# Patient Record
Sex: Male | Born: 1960 | Race: Black or African American | Hispanic: No | Marital: Married | Smoking: Never smoker
Health system: Southern US, Community
[De-identification: ages and names within clinical notes are randomized; demographics above are authoritative.]

## PROBLEM LIST (undated history)

## (undated) DIAGNOSIS — I5020 Unspecified systolic (congestive) heart failure: Secondary | ICD-10-CM

## (undated) DIAGNOSIS — I10 Essential (primary) hypertension: Secondary | ICD-10-CM

## (undated) DIAGNOSIS — M109 Gout, unspecified: Secondary | ICD-10-CM

## (undated) DIAGNOSIS — B1910 Unspecified viral hepatitis B without hepatic coma: Secondary | ICD-10-CM

## (undated) DIAGNOSIS — D649 Anemia, unspecified: Secondary | ICD-10-CM

## (undated) DIAGNOSIS — D696 Thrombocytopenia, unspecified: Secondary | ICD-10-CM

## (undated) DIAGNOSIS — I4891 Unspecified atrial fibrillation: Secondary | ICD-10-CM

## (undated) DIAGNOSIS — N289 Disorder of kidney and ureter, unspecified: Secondary | ICD-10-CM

## (undated) HISTORY — PX: COLONOSCOPY: SHX174

---

## 2013-01-03 ENCOUNTER — Other Ambulatory Visit: Payer: Self-pay | Admitting: Infectious Diseases

## 2013-01-03 ENCOUNTER — Ambulatory Visit
Admission: RE | Admit: 2013-01-03 | Discharge: 2013-01-03 | Disposition: A | Discharge status: Home / Self Care | Payer: No Typology Code available for payment source | Source: Ambulatory Visit | Attending: Infectious Diseases | Admitting: Infectious Diseases

## 2013-01-03 DIAGNOSIS — R7611 Nonspecific reaction to tuberculin skin test without active tuberculosis: Secondary | ICD-10-CM

## 2013-12-12 DIAGNOSIS — I129 Hypertensive chronic kidney disease with stage 1 through stage 4 chronic kidney disease, or unspecified chronic kidney disease: Secondary | ICD-10-CM | POA: Insufficient documentation

## 2013-12-12 DIAGNOSIS — E559 Vitamin D deficiency, unspecified: Secondary | ICD-10-CM | POA: Insufficient documentation

## 2013-12-12 DIAGNOSIS — R809 Proteinuria, unspecified: Secondary | ICD-10-CM | POA: Insufficient documentation

## 2013-12-12 DIAGNOSIS — I509 Heart failure, unspecified: Secondary | ICD-10-CM | POA: Insufficient documentation

## 2013-12-12 DIAGNOSIS — I517 Cardiomegaly: Secondary | ICD-10-CM | POA: Insufficient documentation

## 2019-10-10 DIAGNOSIS — I493 Ventricular premature depolarization: Secondary | ICD-10-CM | POA: Insufficient documentation

## 2019-11-01 ENCOUNTER — Other Ambulatory Visit (HOSPITAL_COMMUNITY): Payer: Self-pay | Admitting: *Deleted

## 2019-11-02 ENCOUNTER — Encounter (HOSPITAL_COMMUNITY): Payer: BC Managed Care – PPO

## 2019-11-02 ENCOUNTER — Encounter (HOSPITAL_COMMUNITY): Payer: Self-pay

## 2019-11-16 ENCOUNTER — Encounter (HOSPITAL_COMMUNITY): Payer: BC Managed Care – PPO

## 2020-07-30 DIAGNOSIS — Z9289 Personal history of other medical treatment: Secondary | ICD-10-CM

## 2020-07-30 HISTORY — DX: Personal history of other medical treatment: Z92.89

## 2020-08-06 ENCOUNTER — Telehealth: Payer: Self-pay

## 2020-08-06 NOTE — Telephone Encounter (Signed)
NOTES ON FILE FROM [facility] [phone], SENT REFERRAL TO SCHEDULING

## 2020-08-07 ENCOUNTER — Telehealth: Payer: Self-pay

## 2020-08-07 NOTE — Telephone Encounter (Signed)
NOTES ON FILE FROM  [facility] [phone], SENT REFERRAL TO SCHEDULING

## 2020-08-23 ENCOUNTER — Other Ambulatory Visit: Payer: Self-pay

## 2020-08-23 ENCOUNTER — Encounter: Payer: Self-pay | Admitting: Cardiology

## 2020-08-23 ENCOUNTER — Ambulatory Visit: Payer: BC Managed Care – PPO | Admitting: Cardiology

## 2020-08-23 VITALS — BP 173/88 | HR 72 | Ht 72.0 in | Wt 189.2 lb

## 2020-08-23 DIAGNOSIS — I10 Essential (primary) hypertension: Secondary | ICD-10-CM

## 2020-08-23 DIAGNOSIS — R079 Chest pain, unspecified: Secondary | ICD-10-CM

## 2020-08-23 DIAGNOSIS — N186 End stage renal disease: Secondary | ICD-10-CM | POA: Diagnosis not present

## 2020-08-23 MED ORDER — HYDRALAZINE HCL 50 MG PO TABS: 50.0000 mg | ORAL_TABLET | Freq: Three times a day (TID) | ORAL | 3 refills | Status: DC

## 2020-08-23 NOTE — Progress Notes (Signed)
Cardiology Office Note:    Date:  08/25/2020   ID:  Antonio Keller, DOB Sep 24, 1960, MRN [id number]  PCP:  Jolinda Croak, MD  Cardiologist:  No primary care provider on file.  Electrophysiologist:  None   Referring MD: Darrol Jump, PA-C   Chief Complaint  Patient presents with   Chest Pain    History of Present Illness:    Antonio Keller is a 61 y.o. male with a hx of ESRD, HTN who presents with chest pain.  He has a history of ESRD but is not on dialysis.  Follows with nephrology.  Most recent creatinine 13.1 on 05/13/2020.  Reports that he had an episode of cramping pain on left side of his chest.  Lasted about 20 minutes.  He was lifting things at work when the chest pain started.  He does not exercise outside of work but is active at his job.  He denies any lightheadedness, syncope, dyspnea, palpitations, or lower extremity edema.  No smoking history.  No history of heart disease in his immediate family.  No past medical history on file.  Current Medications: Current Meds  Medication Sig   carvedilol (COREG) 25 MG tablet Take 25 mg by mouth 2 (two) times daily.   chlorthalidone (HYGROTON) 25 MG tablet Take 25 mg by mouth daily.     Allergies:   Patient has no allergy information on record.   Social History   Socioeconomic History   Marital status: Married    Spouse name: Not on file   Number of children: Not on file   Years of education: Not on file   Highest education level: Not on file  Occupational History   Not on file  Tobacco Use   Smoking status: Never Smoker   Smokeless tobacco: Never Used  Substance and Sexual Activity   Alcohol use: Not Currently   Drug use: Never   Sexual activity: Yes    Partners: Female  Other Topics Concern   Not on file  Social History Narrative   Not on file   Social Determinants of Health   Financial Resource Strain: Not on file  Food Insecurity: Not on file  Transportation Needs: Not on file   Physical Activity: Not on file  Stress: Not on file  Social Connections: Not on file     Family History: No history of heart disease in his immediate family  ROS:   Please see the history of present illness.    All other systems reviewed and are negative.  EKGs/Labs/Other Studies Reviewed:    The following studies were reviewed today:  EKG:  EKG is ordered today.  The ekg ordered today demonstrates sinus rhythm, rate 72, LVH with repolarization abnormalities, QTC 494  Recent Labs: No results found for requested labs within last 8760 hours.  Recent Lipid Panel No results found for: CHOL, TRIG, HDL, CHOLHDL, VLDL, LDLCALC, LDLDIRECT  Physical Exam:    VS:  BP (!) 173/88    Pulse 72    Ht 6' (1.829 m)    Wt 189 lb 3.2 oz (85.8 kg)    SpO2 100%    BMI 25.66 kg/m     Wt Readings from Last 3 Encounters:  08/23/20 189 lb 3.2 oz (85.8 kg)     GEN: Well nourished, well developed in no acute distress HEENT: Normal NECK: No JVD; No carotid bruits LYMPHATICS: No lymphadenopathy CARDIAC: RRR, no murmurs, rubs, gallops RESPIRATORY:  Clear to auscultation without rales, wheezing or rhonchi  ABDOMEN:  Soft, non-tender, non-distended MUSCULOSKELETAL:  No edema; No deformity  SKIN: Warm and dry NEUROLOGIC:  Alert and oriented x 3 PSYCHIATRIC:  Normal affect   ASSESSMENT:    1. Chest pain of uncertain etiology   2. Essential hypertension   3. ESRD (end stage renal disease) ([place])    PLAN:    Chest pain: Atypical in description but does have CAD risk factors (ESRD, hypertension).  Recommend checking Lexiscan Myoview.  If high risk findings, he will need to be started on dialysis prior to heart catheterization.  Will check echocardiogram to evaluate for structural heart disease  Hypertension: On hydralazine 50 mg twice daily, carvedilol 25 mg twice daily, chlorthalidone 25 mg daily.  BP elevated, will increase hydralazine to 50 mg 3 times daily  ESRD: Most recent creatinine 13.1 on  05/13/2020.  Not currently on dialysis, but will likely need to start soon.  He follows with nephrology.  RTC in 6 to 8 weeks  Shared Decision Making/Informed Consent The risks [chest pain, shortness of breath, cardiac arrhythmias, dizziness, blood pressure fluctuations, myocardial infarction, stroke/transient ischemic attack, nausea, vomiting, allergic reaction, radiation exposure, metallic taste sensation and life-threatening complications (estimated to be 1 in 10,000)], benefits (risk stratification, diagnosing coronary artery disease, treatment guidance) and alternatives of a nuclear stress test were discussed in detail with Antonio Keller and he agrees to proceed.        Medication Adjustments/Labs and Tests Ordered: Current medicines are reviewed at length with the patient today.  Concerns regarding medicines are outlined above.  Orders Placed This Encounter  Procedures   MYOCARDIAL PERFUSION IMAGING   ECHOCARDIOGRAM COMPLETE   Meds ordered this encounter  Medications   hydrALAZINE (APRESOLINE) 50 MG tablet    Sig: Take 1 tablet (50 mg total) by mouth 3 (three) times daily.    Dispense:  270 tablet    Refill:  3    Dose increase    Patient Instructions  Medication Instructions:  INCREASE hydralazine to 50 mg three times daily (every 8 hours)  *If you need a refill on your cardiac medications before your next appointment, please call your pharmacy*  Testing/Procedures: Your physician has requested that you have an echocardiogram. Echocardiography is a painless test that uses sound waves to create images of your heart. It provides your doctor with information about the size and shape of your heart and how well your hearts chambers and valves are working. This procedure takes approximately one hour. There are no restrictions for this procedure.  This will be done at our [facility] location:  [place] has requested that you have a  lexiscan myoview. For further information please visit [email or web address]. Please follow instruction sheet, as given. This will take place at [place], [address]  How to prepare for your Myocardial Perfusion Test:  Do not eat or drink 3 hours prior to your test, except you may have water.  Do not consume products containing caffeine (regular or decaffeinated) 12 hours prior to your test. (ex: coffee, chocolate, sodas, tea).  Do bring a list of your current medications with you.  If not listed below, you may take your medications as normal.  Do wear comfortable clothes (no dresses or overalls) and walking shoes, tennis shoes preferred (No heels or open toe shoes are allowed).  Do NOT wear cologne, perfume, aftershave, or lotions (deodorant is allowed).  The test will take approximately 3 to 4 hours to complete  If these  instructions are not followed, your test will have to be rescheduled.   Follow-Up: At [facility], you and your health needs are our priority.  As part of our continuing mission to provide you with exceptional heart care, we have created designated Provider Care Teams.  These Care Teams include your primary Cardiologist (physician) and Advanced Practice Providers (APPs -  Physician Assistants and Nurse Practitioners) who all work together to provide you with the care you need, when you need it.  We recommend signing up for the patient portal called "MyChart".  Sign up information is provided on this After Visit Summary.  MyChart is used to connect with patients for Virtual Visits (Telemedicine).  Patients are able to view lab/test results, encounter notes, upcoming appointments, etc.  Non-urgent messages can be sent to your provider as well.   To learn more about what you can do with MyChart, go to [email or web address].    Your next appointment:   6-8 week(s)  The format for your next appointment:   In Person  Provider:    Dr. Gardiner Rhyme         Signed, Donato Heinz, MD  08/25/2020 2:13 PM    [place]

## 2020-08-23 NOTE — Patient Instructions (Addendum)
Medication Instructions:  INCREASE hydralazine to 50 mg three times daily (every 8 hours)  *If you need a refill on your cardiac medications before your next appointment, please call your pharmacy*  Testing/Procedures: Your physician has requested that you have an echocardiogram. Echocardiography is a painless test that uses sound waves to create images of your heart. It provides your doctor with information about the size and shape of your heart and how well your heart's chambers and valves are working. This procedure takes approximately one hour. There are no restrictions for this procedure.  This will be done at our [facility] location:  [place] has requested that you have a lexiscan myoview. For further information please visit [email or web address]. Please follow instruction sheet, as given. This will take place at [place], [address]  How to prepare for your Myocardial Perfusion Test:  Do not eat or drink 3 hours prior to your test, except you may have water.  Do not consume products containing caffeine (regular or decaffeinated) 12 hours prior to your test. (ex: coffee, chocolate, sodas, tea).  Do bring a list of your current medications with you.  If not listed below, you may take your medications as normal.  Do wear comfortable clothes (no dresses or overalls) and walking shoes, tennis shoes preferred (No heels or open toe shoes are allowed).  Do NOT wear cologne, perfume, aftershave, or lotions (deodorant is allowed).  The test will take approximately 3 to 4 hours to complete  If these instructions are not followed, your test will have to be rescheduled.   Follow-Up: At [facility], you and your health needs are our priority.  As part of our continuing mission to provide you with exceptional heart care, we have created designated Provider Care Teams.  These Care Teams include your primary Cardiologist (physician) and  Advanced Practice Providers (APPs -  Physician Assistants and Nurse Practitioners) who all work together to provide you with the care you need, when you need it.  We recommend signing up for the patient portal called "MyChart".  Sign up information is provided on this After Visit Summary.  MyChart is used to connect with patients for Virtual Visits (Telemedicine).  Patients are able to view lab/test results, encounter notes, upcoming appointments, etc.  Non-urgent messages can be sent to your provider as well.   To learn more about what you can do with MyChart, go to [email or web address].    Your next appointment:   6-8 week(s)  The format for your next appointment:   In Person  Provider:    Dr. Gardiner Rhyme

## 2020-08-29 NOTE — Addendum Note (Signed)
Addended by: Lubertha Sayres on: 08/29/2020 11:25 AM   Modules accepted: Orders

## 2020-09-05 ENCOUNTER — Telehealth (HOSPITAL_COMMUNITY): Payer: Self-pay

## 2020-09-05 NOTE — Telephone Encounter (Signed)
Close encounter 

## 2020-09-10 ENCOUNTER — Ambulatory Visit (HOSPITAL_COMMUNITY)
Admission: RE | Admit: 2020-09-10 | Payer: BC Managed Care – PPO | Source: Ambulatory Visit | Attending: Cardiology | Admitting: Cardiology

## 2020-09-10 ENCOUNTER — Encounter (HOSPITAL_COMMUNITY): Payer: Self-pay | Admitting: Cardiology

## 2020-09-19 ENCOUNTER — Telehealth (HOSPITAL_COMMUNITY): Payer: Self-pay | Admitting: Cardiology

## 2020-09-19 NOTE — Telephone Encounter (Signed)
Just an FYI. We have made several attempts to contact this patient including sending a letter to schedule or reschedule their Myoview. We will be removing the patient from the echo/nuc WQ.   09/10/20 NO SHOWED -MAILED LETTER [facility]     Thank you

## 2020-09-25 ENCOUNTER — Other Ambulatory Visit (HOSPITAL_COMMUNITY): Payer: BC Managed Care – PPO

## 2020-09-25 ENCOUNTER — Encounter (HOSPITAL_COMMUNITY): Payer: Self-pay | Admitting: Cardiology

## 2020-10-09 ENCOUNTER — Telehealth (HOSPITAL_COMMUNITY): Payer: Self-pay | Admitting: Cardiology

## 2020-10-09 NOTE — Telephone Encounter (Signed)
Just an FYI. We have made several attempts to contact this patient including sending a letter to schedule or reschedule their echocardiogram. We will be removing the patient from the echo WQ.    09/25/20 NO SHOWED -MAILED LETTER [facility]  Thank you 

## 2020-10-18 ENCOUNTER — Ambulatory Visit: Payer: BC Managed Care – PPO | Admitting: Cardiology

## 2020-12-18 ENCOUNTER — Observation Stay (HOSPITAL_COMMUNITY): Payer: BC Managed Care – PPO

## 2020-12-18 ENCOUNTER — Emergency Department (HOSPITAL_COMMUNITY): Payer: BC Managed Care – PPO

## 2020-12-18 ENCOUNTER — Observation Stay (HOSPITAL_COMMUNITY)
Admission: EM | Admit: 2020-12-18 | Discharge: 2020-12-19 | Disposition: A | Discharge status: Home / Self Care | Payer: BC Managed Care – PPO | Attending: Internal Medicine | Admitting: Internal Medicine

## 2020-12-18 ENCOUNTER — Encounter (HOSPITAL_COMMUNITY): Payer: Self-pay | Admitting: Emergency Medicine

## 2020-12-18 DIAGNOSIS — I10 Essential (primary) hypertension: Secondary | ICD-10-CM | POA: Diagnosis present

## 2020-12-18 DIAGNOSIS — E872 Acidosis: Secondary | ICD-10-CM | POA: Diagnosis not present

## 2020-12-18 DIAGNOSIS — I160 Hypertensive urgency: Secondary | ICD-10-CM | POA: Diagnosis not present

## 2020-12-18 DIAGNOSIS — N186 End stage renal disease: Secondary | ICD-10-CM | POA: Diagnosis not present

## 2020-12-18 DIAGNOSIS — I120 Hypertensive chronic kidney disease with stage 5 chronic kidney disease or end stage renal disease: Principal | ICD-10-CM | POA: Insufficient documentation

## 2020-12-18 DIAGNOSIS — N189 Chronic kidney disease, unspecified: Secondary | ICD-10-CM

## 2020-12-18 DIAGNOSIS — Z20822 Contact with and (suspected) exposure to covid-19: Secondary | ICD-10-CM | POA: Diagnosis not present

## 2020-12-18 DIAGNOSIS — N179 Acute kidney failure, unspecified: Secondary | ICD-10-CM | POA: Diagnosis not present

## 2020-12-18 DIAGNOSIS — D631 Anemia in chronic kidney disease: Secondary | ICD-10-CM | POA: Diagnosis not present

## 2020-12-18 DIAGNOSIS — D649 Anemia, unspecified: Secondary | ICD-10-CM

## 2020-12-18 DIAGNOSIS — Z79899 Other long term (current) drug therapy: Secondary | ICD-10-CM | POA: Insufficient documentation

## 2020-12-18 DIAGNOSIS — N185 Chronic kidney disease, stage 5: Secondary | ICD-10-CM

## 2020-12-18 HISTORY — DX: Disorder of kidney and ureter, unspecified: N28.9

## 2020-12-18 HISTORY — DX: Essential (primary) hypertension: I10

## 2020-12-18 LAB — URINALYSIS, ROUTINE W REFLEX MICROSCOPIC
Bilirubin Urine: NEGATIVE
Glucose, UA: NEGATIVE mg/dL
Ketones, ur: NEGATIVE mg/dL
Leukocytes,Ua: NEGATIVE
Nitrite: NEGATIVE
Protein, ur: 100 mg/dL — AB
Specific Gravity, Urine: 1.008 (ref 1.005–1.030)
pH: 6 (ref 5.0–8.0)

## 2020-12-18 LAB — IRON AND TIBC
Iron: 59 ug/dL (ref 45–182)
Saturation Ratios: 18 % (ref 17.9–39.5)
TIBC: 335 ug/dL (ref 250–450)
UIBC: 276 ug/dL

## 2020-12-18 LAB — CBC
HCT: 19.7 % — ABNORMAL LOW (ref 39.0–52.0)
HCT: 21.2 % — ABNORMAL LOW (ref 39.0–52.0)
Hemoglobin: 6.7 g/dL — CL (ref 13.0–17.0)
Hemoglobin: 7.1 g/dL — ABNORMAL LOW (ref 13.0–17.0)
MCH: 27.6 pg (ref 26.0–34.0)
MCH: 27.6 pg (ref 26.0–34.0)
MCHC: 34 g/dL (ref 30.0–36.0)
MCHC: 33.5 g/dL (ref 30.0–36.0)
MCV: 81.1 fL (ref 80.0–100.0)
MCV: 82.5 fL (ref 80.0–100.0)
Platelets: 79 10*3/uL — ABNORMAL LOW (ref 150–400)
Platelets: 77 10*3/uL — ABNORMAL LOW (ref 150–400)
RBC: 2.43 MIL/uL — ABNORMAL LOW (ref 4.22–5.81)
RBC: 2.57 MIL/uL — ABNORMAL LOW (ref 4.22–5.81)
RDW: 15.4 % (ref 11.5–15.5)
RDW: 15.4 % (ref 11.5–15.5)
WBC: 4.8 10*3/uL (ref 4.0–10.5)
WBC: 4.5 10*3/uL (ref 4.0–10.5)
nRBC: 0 % (ref 0.0–0.2)
nRBC: 0 % (ref 0.0–0.2)

## 2020-12-18 LAB — RETICULOCYTES
Immature Retic Fract: 13.6 % (ref 2.3–15.9)
RBC.: 2.47 MIL/uL — ABNORMAL LOW (ref 4.22–5.81)
Retic Count, Absolute: 20.5 10*3/uL (ref 19.0–186.0)
Retic Ct Pct: 0.8 % (ref 0.4–3.1)

## 2020-12-18 LAB — COMPREHENSIVE METABOLIC PANEL
ALT: 17 U/L (ref 0–44)
AST: 13 U/L — ABNORMAL LOW (ref 15–41)
Albumin: 3.6 g/dL (ref 3.5–5.0)
Alkaline Phosphatase: 88 U/L (ref 38–126)
Anion gap: 15 (ref 5–15)
BUN: 142 mg/dL — ABNORMAL HIGH (ref 6–20)
CO2: 17 mmol/L — ABNORMAL LOW (ref 22–32)
Calcium: 7.3 mg/dL — ABNORMAL LOW (ref 8.9–10.3)
Chloride: 107 mmol/L (ref 98–111)
Creatinine, Ser: 16.49 mg/dL — ABNORMAL HIGH (ref 0.61–1.24)
GFR, Estimated: 3 mL/min — ABNORMAL LOW (ref >60)
Glucose, Bld: 91 mg/dL (ref 70–99)
Potassium: 4.2 mmol/L (ref 3.5–5.1)
Sodium: 139 mmol/L (ref 135–145)
Total Bilirubin: 0.7 mg/dL (ref 0.3–1.2)
Total Protein: 6.9 g/dL (ref 6.5–8.1)

## 2020-12-18 LAB — PROTIME-INR
INR: 1.3 — ABNORMAL HIGH (ref 0.8–1.2)
Prothrombin Time: 16.1 seconds — ABNORMAL HIGH (ref 11.4–15.2)

## 2020-12-18 LAB — TECHNOLOGIST SMEAR REVIEW: Plt Morphology: NORMAL

## 2020-12-18 LAB — MAGNESIUM: Magnesium: 2 mg/dL (ref 1.7–2.4)

## 2020-12-18 LAB — SARS CORONAVIRUS 2 (TAT 6-24 HRS): SARS Coronavirus 2: NEGATIVE

## 2020-12-18 LAB — VITAMIN B12: Vitamin B-12: 414 pg/mL (ref 180–914)

## 2020-12-18 LAB — TSH: TSH: 2.152 u[IU]/mL (ref 0.350–4.500)

## 2020-12-18 LAB — FOLATE: Folate: 12.5 ng/mL (ref >5.9)

## 2020-12-18 LAB — PHOSPHORUS: Phosphorus: 8.3 mg/dL — ABNORMAL HIGH (ref 2.5–4.6)

## 2020-12-18 LAB — VITAMIN D 25 HYDROXY (VIT D DEFICIENCY, FRACTURES): Vit D, 25-Hydroxy: 26.07 ng/mL — ABNORMAL LOW (ref 30–100)

## 2020-12-18 LAB — ABO/RH: ABO/RH(D): O POS

## 2020-12-18 LAB — HIV ANTIBODY (ROUTINE TESTING W REFLEX): HIV Screen 4th Generation wRfx: NONREACTIVE

## 2020-12-18 LAB — FERRITIN: Ferritin: 232 ng/mL (ref 24–336)

## 2020-12-18 LAB — PREPARE RBC (CROSSMATCH)

## 2020-12-18 LAB — APTT: aPTT: 41 seconds — ABNORMAL HIGH (ref 24–36)

## 2020-12-18 MED ORDER — HEPARIN SODIUM (PORCINE) 5000 UNIT/ML IJ SOLN: 5000.0000 [IU] | Freq: Three times a day (TID) | INTRAMUSCULAR | Status: DC

## 2020-12-18 MED ORDER — CARVEDILOL 25 MG PO TABS
25.0000 mg | ORAL_TABLET | Freq: Two times a day (BID) | ORAL | Status: DC
  Administered 2020-12-19: 25 mg via ORAL
  Filled 2020-12-18: qty 1

## 2020-12-18 MED ORDER — HYDRALAZINE HCL 25 MG PO TABS
25.0000 mg | ORAL_TABLET | Freq: Once | ORAL | Status: AC
  Administered 2020-12-18: 25 mg via ORAL
  Filled 2020-12-18: qty 1

## 2020-12-18 MED ORDER — LABETALOL HCL 5 MG/ML IV SOLN
20.0000 mg | Freq: Once | INTRAVENOUS | Status: AC
  Administered 2020-12-18: 20 mg via INTRAVENOUS
  Filled 2020-12-18: qty 4

## 2020-12-18 MED ORDER — HYDRALAZINE HCL 25 MG PO TABS: 25.0000 mg | ORAL_TABLET | Freq: Three times a day (TID) | ORAL | Status: DC

## 2020-12-18 MED ORDER — LABETALOL HCL 5 MG/ML IV SOLN: 5.0000 mg | INTRAVENOUS | Status: DC | PRN

## 2020-12-18 MED ORDER — SENNOSIDES-DOCUSATE SODIUM 8.6-50 MG PO TABS: 1.0000 | ORAL_TABLET | Freq: Every evening | ORAL | Status: DC | PRN

## 2020-12-18 MED ORDER — ACETAMINOPHEN 325 MG PO TABS: 650.0000 mg | ORAL_TABLET | Freq: Four times a day (QID) | ORAL | Status: DC | PRN

## 2020-12-18 MED ORDER — METOPROLOL TARTRATE 5 MG/5ML IV SOLN
5.0000 mg | Freq: Once | INTRAVENOUS | Status: AC
  Administered 2020-12-18: 5 mg via INTRAVENOUS
  Filled 2020-12-18: qty 5

## 2020-12-18 MED ORDER — SODIUM CHLORIDE 0.9% FLUSH
3.0000 mL | Freq: Two times a day (BID) | INTRAVENOUS | Status: DC
  Administered 2020-12-18 – 2020-12-19 (×3): 3 mL via INTRAVENOUS

## 2020-12-18 MED ORDER — SODIUM BICARBONATE 650 MG PO TABS
650.0000 mg | ORAL_TABLET | Freq: Three times a day (TID) | ORAL | Status: DC
  Administered 2020-12-18 – 2020-12-19 (×3): 650 mg via ORAL
  Filled 2020-12-18 (×4): qty 1

## 2020-12-18 MED ORDER — SODIUM CHLORIDE 0.9% IV SOLUTION: Freq: Once | INTRAVENOUS | Status: DC

## 2020-12-18 MED ORDER — HYDRALAZINE HCL 10 MG PO TABS
10.0000 mg | ORAL_TABLET | Freq: Three times a day (TID) | ORAL | Status: DC
  Administered 2020-12-19: 10 mg via ORAL
  Filled 2020-12-18: qty 1

## 2020-12-18 MED ORDER — ACETAMINOPHEN 650 MG RE SUPP: 650.0000 mg | Freq: Four times a day (QID) | RECTAL | Status: DC | PRN

## 2020-12-18 NOTE — Consult Note (Addendum)
[facility] Renal Consultation Note  Requesting MD: Dr. Heber [place] Indication for Consultation: Progressive renal failure  HPI:  Antonio Keller is a 60 y.o. 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 Patient follows with [facility], previously saw Dr. Chancy Milroy but now sees Dr. Moshe Cipro.  Patient was seen at nephrologist office on Monday and lab was significant for low hemoglobin.  Patient was called on Tuesday to come to the ED for blood transfusion.  Patient decided to come to the ED today.  States he has been working all week and feels fine.  He denies any nausea, vomiting, dizziness, abdominal pain, shortness of breath, chest pain, palpitations, headaches, blurred vision, bloody stools, hematuria, fever/chills, dysuria, itching or muscle cramps.   Of note, patient's nephrologist has had extensive conversations with patient about the need for dialysis in the setting of his progressive renal disease. Patient has refused multiple times as he reports not feeling bad and has a loved one who had a traumatic experience with dialysis in the past.  In the ED, found to have BP 190-210/100-120 and HR in the 70s.  Labs were significant for creatinine 16.49, BUN 142, GFR 3, [id number] 7.3, metabolic acidosis with bicarb of 17, hemoglobin 6.7, platelets 79, ferritin 232, iron 59, iron sat 18%, folate 12.5, vitamin B12 414.  While in the ED, patient developed A. fib with HR in the 120s to 140s.  IMTS was consulted for admission and nephrology was consulted for further management of his renal failure.  Creatinine, Ser  Date/Time Value Ref Range Status  12/18/2020 08:52 AM 16.49 (H) 0.61 - 1.24 mg/dL Final    PMHx:   Past Medical History:  Diagnosis Date   Hypertension    Renal disorder     History reviewed. No pertinent surgical  history.  Family Hx: History reviewed. No pertinent family history.  Social History:  reports that he has never smoked. He has never used smokeless tobacco. He reports previous alcohol use. He reports that he does not use drugs.  Allergies: No Known Allergies  Medications: Prior to Admission medications   Medication Sig Start Date End Date Taking? Authorizing Provider  allopurinol (ZYLOPRIM) 100 MG tablet Take 100 mg by mouth daily. 12/01/20   [provider]  carvedilol (COREG) 25 MG tablet Take 25 mg by mouth 2 (two) times daily. 04/26/18   [provider]  chlorthalidone (HYGROTON) 25 MG tablet Take 25 mg by mouth daily. 07/16/20   [provider]  doxercalciferol (HECTOROL) 0.5 MCG capsule Take 0.5 mcg by mouth daily. 10/08/20   [provider]  hydrALAZINE (APRESOLINE) 50 MG tablet Take 1 tablet (50 mg total) by mouth 3 (three) times daily. 08/23/20   Donato Heinz, MD    I have reviewed the patient's current medications.  Labs:  Results for orders placed or performed during the hospital encounter of 12/18/20 (from the past 48 hour(s))  Comprehensive metabolic panel     Status: Abnormal   Collection Time: 12/18/20  8:52 AM  Result Value Ref Range   Sodium 139 135 - 145 mmol/L   Potassium 4.2 3.5 - 5.1 mmol/L   Chloride 107 98 - 111 mmol/L   CO2 17 (L) 22 - 32 mmol/L   Glucose, Bld 91 70 - 99 mg/dL    Comment: Glucose reference range applies only to samples taken  after fasting for at least 8 hours.   BUN 142 (H) 6 - 20 mg/dL   Creatinine, Ser 16.49 (H) 0.61 - 1.24 mg/dL   Calcium 7.3 (L) 8.9 - 10.3 mg/dL   Total Protein 6.9 6.5 - 8.1 g/dL   Albumin 3.6 3.5 - 5.0 g/dL   AST 13 (L) 15 - 41 U/L   ALT 17 0 - 44 U/L   Alkaline Phosphatase 88 38 - 126 U/L   Total Bilirubin 0.7 0.3 - 1.2 mg/dL   GFR, Estimated 3 (L) >60 mL/min    Comment: (NOTE) Calculated using the CKD-EPI Creatinine Equation (2021)    Anion gap 15 5 - 15    Comment:  Performed at [facility], [place] [address] [place] [address]  CBC     Status: Abnormal   Collection Time: 12/18/20  8:52 AM  Result Value Ref Range   WBC 4.8 4.0 - 10.5 K/uL   RBC 2.43 (L) 4.22 - 5.81 MIL/uL   Hemoglobin 6.7 (LL) 13.0 - 17.0 g/dL    Comment: REPEATED TO VERIFY THIS CRITICAL RESULT HAS VERIFIED AND BEEN CALLED TO C ROWE, RN BY JENNIFER COLE ON 07 20 2022 AT 1007, AND HAS BEEN READ BACK.     HCT 19.7 (L) 39.0 - 52.0 %   MCV 81.1 80.0 - 100.0 fL   MCH 27.6 26.0 - 34.0 pg   MCHC 34.0 30.0 - 36.0 g/dL   RDW 15.4 11.5 - 15.5 %   Platelets 79 (L) 150 - 400 K/uL    Comment: Immature Platelet Fraction may be clinically indicated, consider ordering this additional test [id number] REPEATED TO VERIFY PLATELET COUNT CONFIRMED BY SMEAR    nRBC 0.0 0.0 - 0.2 %    Comment: Performed at [facility], [place] [address], [place], [place] [address]  Reticulocytes     Status: Abnormal   Collection Time: 12/18/20  8:52 AM  Result Value Ref Range   Retic Ct Pct 0.8 0.4 - 3.1 %   RBC. 2.47 (L) 4.22 - 5.81 MIL/uL   Retic Count, Absolute 20.5 19.0 - 186.0 K/uL   Immature Retic Fract 13.6 2.3 - 15.9 %    Comment: Performed at [facility] [address] [place], [place] [address]  Type and screen [place]     Status: None (Preliminary result)   Collection Time: 12/18/20  9:00 AM  Result Value Ref Range   ABO/RH(D) O POS    Antibody Screen NEG    Sample Expiration      12/21/2020,2359 Performed at [facility], [place] [address], [place], [place] [address]    Unit Number [id number]    Blood Component Type RBC LR PHER1    Unit division 00    Status of Unit ALLOCATED    Transfusion Status OK TO TRANSFUSE    Crossmatch Result Compatible   ABO/Rh     Status: None   Collection Time: 12/18/20  9:05 AM  Result Value Ref Range   ABO/RH(D)      O POS Performed at [facility], [place] [address] [place], [place] [address]    Vitamin B12     Status: None   Collection Time: 12/18/20 11:28 AM  Result Value Ref Range   Vitamin B-12 414 180 - 914 pg/mL    Comment: (NOTE) This assay is not validated for testing neonatal or myeloproliferative syndrome specimens for Vitamin B12 levels. Performed at  [facility], [place] [address] [place], [place] [address]   Folate     Status: None   Collection Time: 12/18/20 11:28 AM  Result Value Ref Range   Folate 12.5 >5.9 ng/mL    Comment: Performed at [facility] [address], [place], [place] [address]  Iron and TIBC     Status: None   Collection Time: 12/18/20 11:28 AM  Result Value Ref Range   Iron 59 45 - 182 ug/dL   TIBC 335 250 - 450 ug/dL   Saturation Ratios 18 17.9 - 39.5 %   UIBC 276 ug/dL    Comment: Performed at [facility], [place] [address], [place], [place] [address]  Ferritin     Status: None   Collection Time: 12/18/20 11:28 AM  Result Value Ref Range   Ferritin 232 24 - 336 ng/mL    Comment: Performed at [facility], [place] [address], [place], [place] [address]  Prepare RBC (crossmatch)     Status: None   Collection Time: 12/18/20  4:00 PM  Result Value Ref Range   Order Confirmation      ORDER PROCESSED BY BLOOD BANK Performed at [facility], [place] [address], [place], [place] [address]      ROS:  Pertinent items are noted in HPI.  Physical Exam: Vitals:   12/18/20 1645 12/18/20 1716  BP: (!) 145/120 (!) 163/131  Pulse: (!) 124 (!) 134  Resp: 18 12  Temp:  98.2 F (36.8 C)  SpO2: 100%      General: Pleasant, well-appearing middle-age man laying in bed. No acute distress. Head: Normocephalic. Atraumatic. CV: Tachycardic. Irregular regular rhythm.  No murmurs, rubs, or gallops. No LE edema Pulmonary: Lungs CTAB. Normal effort. No wheezing or rales. Abdominal: Soft, nontender, nondistended. Normal bowel sounds. Extremities: Palpable radial and DP pulses. Normal ROM. Skin: Warm and dry. No obvious rash or  lesions.  No purpura Neuro: A&Ox3. Moves all extremities. Normal sensation. No focal deficit. Psych: Normal mood and affect   Assessment/Plan: Sherron Ipock is a 60 y.o. 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  ESRD not on dialysis -patient with history of progressive CKD since 2015.  Creatinine has ranged from 2.5 in 2015 to 4.3 in 2020 then double digits since 2020.  Patient follows with [facility] and multiple providers have had discussions with patient about starting dialysis however patient has refused every time due to not feeling bad and a loved one's traumatic experience with dialysis.  Found to have a creatinine of 16 with a BUN of 142 patient continues to denies any symptoms.  -- We will initiate plans for dialysis if patient agreeable  -- Follow-up UA to assess for proteinuria  -- MA/CR to quantify proteinuria  -- Strict I&Os, daily weights  -- Trend BMP, electrolytes  -- Avoid nephrotoxic agents 2. Hypertension/volume  -Patient with uncontrolled hypertension on multiple antihypertensives including hydralazine 50 mg 3 times daily, Coreg 25 mg twice daily, chlorthalidone 25 mg daily.  Reports he did not take any his medications today. Found to have SBP in the 190s to 210s. S/p IV labetalol 20 mg, IV Lopressor 5 mg, oral hydralazine 25 mg x 1 dose. SBP improved to the 140s to 160s.  -- Resume home hydralazine 50 mg TID,  Coreg 25 mg BID and chlorthalidone 25 mg daily  -- Lower BP gradually 4.  Acute on chronic anemia (IDA, AoCD) -Patient with a history of iron deficiency anemia and anemia of chronic disease secondary to CKD stage V.  Baseline hemoglobin 7-9 since 2020.  Found to have hemoglobin 6.7 on arrival. Iron studies within normal limits.  Currently asymptomatic with no signs of active bleed.  -- Getting 1  unit PRBC per primary team  -- Will consider giving Aranesp while inpatient  -- Trend CBC 5.  Metabolic acidosis -found to have with CO2 of 17 in setting of progressive renal failure.  -- Continue bicarb 650 3 times daily  -- Daily BMP 6. Bones: Patient with a history of tertiary hyperparathyroidism. Intact PTH of 302 and vitamin D 37 in 2020.  -- Follow-up repeat intact PTH and vitamin D 7.  New onset A. Fib --Patient found to be in A. fib with HR in the 120s to 140s in the setting of acute anemia and severe asymptomatic hypertension. EKG showed A. fib with LVH and prolonged QT. S/p IV metoprolol 5 mg.   -- Resume home Coreg 25 mg twice daily  -- Telemetry 8.  Gout --No current flare.  Reports he has been avoiding certain type of food and often skipping meals due to fear of gout flare.  -- Management per primary team 9. Thrombocytopenia -- Found to have troponin of 79 on admission from 112 on 04/2020. No purpura on exam.  -- Follow-up Smear   Lacinda Axon 12/18/2020, 4:31 PM   Patient seen and examined, agree with above note with above modifications.  Patient well known to me from OP clinic-  I have been following him for several months now-  it has been difficult to convince him that he needs RRT because he insists that he feels well-  works full time and denies any and all uremic sxms.  Lately has had anemia of CKD-  we have tried for at least 2 months to secure him iv iron and ESA as Op but his insurance company has refused so now must give a blood transfusion.  He is very nice but continues to refuse initiation of dialysis but will consent to getting blood.  We will visit again in the AM-  will probably dose with aranesp but I doubt we will be able to convince him to initiate dialysis  Corliss Parish, MD 12/18/2020

## 2020-12-18 NOTE — Hospital Course (Addendum)
Antonio Keller is a 60 year old male with a pertinent past medical history of HTN, ESRD w/ CKD stage 5, and gout who presented with a hgb of 6.7 and severe asymptomatic HTN.    Severe Asymptomatic Hypertension Presented with blood pressures elevated (SBP 210s and DBPs 100s), remained asymptomatic. Patient was restarted on home Carvedilol, Hydral, and Chlorthalidone which lowered BP to systolic [id number]. Patient continued on home BP medications on discharge.   ESRD not on dialysis Tertiary hyperparathyroidism Non-anion gap metabolic acidosis Patient presented with a Cr 16.49, and Bun 142, Co2 17 , hyperphosphatemia,and Ca 7.3. Mentation was at baseline, and patient denied abdominal pain, and chest pain. Phosphorus was also elevated. Nephrology was consulted and they recommended patient strongly consider dialysis for his case moving forward but that dialysis is not indicated urgently at this moment. Kidney function remained stable during course of admission. Renal ultrasound showed kidney changes associated with CKD. UA was unremarkable except for proteinuria. Will follow-up with established nephrologist. Recommend for renal function labs at time of follow up.   Acute on chronic anemia in setting of iron deficiency anemia and anemia of chronic disease 2/2 chronic renal disease  Patient presented with with Hgb of 6.7 on admission. Likely due to CKD given no concerns for acute bleed. Given 2u pRBC with Epo as well. Peripheral smear showed target cells. Recommend f/u CBC at follow up visit.   Tachycardia Patient presented with HR elevated 130s-150s. Remained asymptomatic. EKG unclear for SVT vs Afib w/ RVR. Rates improved s/p blood transfusion x1. Electrolytes were repleted as indicated.   Low Vitamin D Patient's vitamin D levels decreased, in the setting of ESRD. PTH labs were pending by the time of discharge. Patient was started on Vitamin D supplementation as per nephrology recommendations.

## 2020-12-18 NOTE — ED Notes (Signed)
Patient tachy 150-160's MD notified via amion

## 2020-12-18 NOTE — H&P (Addendum)
Date: 12/18/2020               Patient Name:  Antonio Keller MRN: [id number]  DOB: 01-20-1961 Age / Sex: 60 y.o., male   PCP: Jolinda Croak, MD              Medical Service: Internal Medicine Teaching Service              Attending Physician: Dr. Joni Reining    First Contact: Asha, [place] [address] Pager: [phone]  Second Contact: Dr. Marianna Payment Pager: [phone]            After Hours (After 5p/  First Contact Pager: [phone]  weekends / holidays): Second Contact Pager: [phone]   Chief Complaint: Severe asymptomatic hypertension  History of Present Illness:   Mr. Marinacci is a  60 year old male with a pertinent medical history of CKD Stage 5 not on dialysis, essential hypertension, gout, and tertiary hyperparathyroidism.   Patient presented to the ED today, because his primary care informed him that his hemoglobin was low after blood work that was done during an office visit on 12/16/20. He states that he feels fine currently. He is aware that his blood pressure tends to run high, and endorses compliance with his BP medication, however he does not check his Bps at home. Unclear if he has taken his BP medications today.  For his kidney disease, he is managed by [facility], however the patient is not amenable to dialysis unless if it is needed for lifesaving purposes given that a loved one had a traumatic experience with dialysis in the past.  Per chart review, he was seen by a Cardiologist in March 2022 for chest pain where he was suggested to undergo additional testing, echo and a Lexiscan. However, patient attributed the chest pain to chronic constipation secondary to iron supplementation he was started on hence did not undergo the testing.   He denies current GI symptoms of abdominal pain and bloody stools. He does endorse eating a high carb diet as he avoid eating protein due to his concern for gout flares. He endorses intermittent leg swelling but states that its due to his  gout and that he uses allopurinol for it.   ROS is negative for chest pain, palpitations, chest pressure, headaches, dizziness, bloody stools, abdominal pain, fatigue and shortness of breath ROS is positive for leg swelling  Meds:  Patient endorses taking Carvedilol, Hydral and Chlorthalidone No outpatient medications have been marked as taking for the 12/18/20 encounter [facility] Encounter).   Allergies: Allergies as of 12/18/2020   (No Known Allergies)   Past Medical History:  Diagnosis Date   Hypertension    Renal disorder     Family History:  Patient shares that his father had high blood pressures  Denies family history of kidney disease  Social History:  Patient works in a [employer]. He lives with his wife. He denies alcohol and tobacco product use.   Review of Systems: A complete ROS was negative except as per HPI.   Physical Exam: Blood pressure (!) 139/120, pulse (!) 131, temperature 98.1 F (36.7 C), temperature source Oral, resp. rate 12, SpO2 100 %.  Constitutional: stable-appearing sitting in bed, in no acute distress HENT: normocephalic atraumatic, Eyes: conjunctiva non-erythematous, no scleral icterus Cardiovascular: tachycardic, difficulty to assess if rhythm is regular, trace pitting edema in BLEs, JVD to the level of the mandible Pulmonary/Chest: normal work of breathing on room air, CTAB Abdominal: soft, non-tender, non-distended  MSK: no focal or gross deficits Neurological: alert, answers questions appropriately, no asterixis bilaterally Psych: affect mood congruent   EKG: personally reviewed my interpretation is tachyarrhythmia, unclear if it is regular. SVT vs A-fib with RVR. Some ST depressions in V5 and V6, however those also present in prior from 07/2020.  CXR: personally reviewed my interpretation is no cardiomegaly, no tracheal deviation, lungs not hyperinflated, no consolidations or opacities  Assessment & Plan by Problem: Active Problems:    Severe uncontrolled hypertension  Severe Asymptomatic Hypertension Presented with blood pressures elevated (SBP 210s and DBPs 100s). No headaches, dizziness, or chest pain at this time. Improved to sbp of 140s s/p labetalol 20 mg x1 and Hydral 25 mg x1, however diastolic pressure still elevated. Will continue to monitor and  - Will start Hydralazine 10 mg TID and carvedilol 25 mg BID tomorrow - Labetalol PRN for SBP > 180 and/or DBP > 100, hold if HR < 70 - continue to monitor - routine vitals  SVT versus AFIB: Presented with elevated HR 130s-150s. EKG unclear for SVT vs A-fib with RVR as mentioned above. Patient denies chest pains or palpitations at this time. HR did not improve with Metoprolol 5 mg push. Electrolytes wnl. Likely etiology is anemia. Will transfuse and reassess afterwards. If he remains tachy, then start beta blocker (carvedilol or metoprolol)  - On telemetry - Transfuse and reassess   ESRD; CKD Stage 5 Patient has a history of ESRD secondary CKD stage 5. Admission labs remarkable for creatinine 16.49, BUN 142, and Co2 of 17. Patient is alert and oriented at this time with no chest discomfort, asterixis or abdominal pain. Of note, he does have trace pitting edema in BLEs, and JVD to the level of the mandible. Patient is followed by [facility] and has not been amenable to starting dialysis previously. Inpatient nephrology team has been consulted for assessment of need for urgent dialysis. At this time urgent dialysis is not indicated and patient is refusing dialysis.  - Nephrology following; appreciate recommendations - morning CMP - UA pending - renal US pending - Bicarb 650 TID   Hypoproliferative Anemia Presents with Hgb 6.7, Hct 19.7, platelets 79. Reticulocyte index calculated to be .15. Iron is 59, TIBC 335, Saturation ratios 18. Ferritin 232. No concern for active bleed at this time. Likely secondary to ESRD. Given that he is tachycardic to the 130s-150s and  minimal hypervolemia on physical exam will transfuse. -Transfusion - Midnight CBC - follow-up POBT  Chronic Conditions: Gout: Continue home allopurinol   Dispo: Admit patient to Observation with expected length of stay less than 2 midnights.  SignedOk Edwards, Medical Student 12/18/2020, 3:45 PM  Pager: [phone]   Attestation for Student Documentation:  I personally was present and performed or re-performed the history, physical exam and medical decision-making activities of this service and have verified that the service and findings are accurately documented in the student's note.   Lawerance Cruel, D.O.  Internal Medicine Resident, PGY-3 [facility]  Pager: [phone] 6:43 PM, 12/18/2020   **Please contact the on call pager after 5 pm and on weekends at [phone].**

## 2020-12-18 NOTE — ED Provider Notes (Signed)
[facility] EMERGENCY DEPARTMENT Provider Note   CSN: [id number] Arrival date & time: 12/18/20  [id number]     History Chief Complaint  Patient presents with   Abnormal Lab   Hypertension    severe   Chronic Renal Failure    Antonio Keller is a 60 y.o. male.  Patient with hx htn, c/o being told to go to ED as blood count low. Symptoms gradual onset, moderate, persistent, slowly worsening. Patient w hx uncontrolled htn and resultant kidney disease. No dialysis as of yet. Denies recent blood loss, rectal bleeding or melena. Mild general weakness. No syncope. Denies chest pain or sob. No abd pain or nv. No extremity swelling or pain. Denies headache. No change in vision or speech. No unilateral numbness or weakness.   The history is provided by the patient.  Abnormal Lab     Past Medical History:  Diagnosis Date   Hypertension    Renal disorder     There are no problems to display for this patient.   History reviewed. No pertinent surgical history.     History reviewed. No pertinent family history.  Social History   Tobacco Use   Smoking status: Never   Smokeless tobacco: Never  Substance Use Topics   Alcohol use: Not Currently   Drug use: Never    Home Medications Prior to Admission medications   Medication Sig Start Date End Date Taking? Authorizing Provider  carvedilol (COREG) 25 MG tablet Take 25 mg by mouth 2 (two) times daily. 04/26/18   [provider]  chlorthalidone (HYGROTON) 25 MG tablet Take 25 mg by mouth daily. 07/16/20   [provider]  hydrALAZINE (APRESOLINE) 50 MG tablet Take 1 tablet (50 mg total) by mouth 3 (three) times daily. 08/23/20   Donato Heinz, MD    Allergies    Patient has no known allergies.  Review of Systems   Review of Systems  Constitutional:  Negative for fever.  HENT:  Negative for sore throat.   Eyes:  Negative for pain and visual disturbance.  Respiratory:  Negative for  shortness of breath.   Cardiovascular:  Negative for chest pain.  Gastrointestinal:  Negative for abdominal pain, blood in stool and vomiting.  Genitourinary:  Negative for flank pain and hematuria.  Musculoskeletal:  Negative for back pain and neck pain.  Skin:  Negative for rash.  Neurological:  Negative for headaches.  Hematological:  Does not bruise/bleed easily.  Psychiatric/Behavioral:  Negative for confusion.    Physical Exam Updated Vital Signs BP (!) 153/121   Pulse (!) 136   Temp 98.1 F (36.7 C) (Oral)   Resp 19   SpO2 100%   Physical Exam Vitals and nursing note reviewed.  Constitutional:      Appearance: Normal appearance. Antonio Keller is well-developed.  HENT:     Head: Atraumatic.     Nose: Nose normal.     Mouth/Throat:     Mouth: Mucous membranes are moist.     Pharynx: Oropharynx is clear.  Eyes:     General: No scleral icterus.    Pupils: Pupils are equal, round, and reactive to light.  Neck:     Trachea: No tracheal deviation.  Cardiovascular:     Rate and Rhythm: Normal rate and regular rhythm.     Pulses: Normal pulses.     Heart sounds: Normal heart sounds. No murmur heard.   No friction rub. No gallop.  Pulmonary:     Effort: Pulmonary effort  is normal. No accessory muscle usage or respiratory distress.     Breath sounds: Normal breath sounds.  Abdominal:     General: Bowel sounds are normal. There is no distension.     Palpations: Abdomen is soft.     Tenderness: There is no abdominal tenderness.  Genitourinary:    Comments: No cva tenderness. Musculoskeletal:        General: No swelling.     Cervical back: Normal range of motion and neck supple. No rigidity.  Skin:    General: Skin is warm and dry.     Findings: No rash.  Neurological:     Mental Status: Antonio Keller is alert.     Comments: Alert, speech clear.   Psychiatric:        Mood and Affect: Mood normal.    ED Results / Procedures / Treatments   Labs (all labs ordered are listed, but only  abnormal results are displayed) Results for orders placed or performed during the hospital encounter of 12/18/20  Comprehensive metabolic panel  Result Value Ref Range   Sodium 139 135 - 145 mmol/L   Potassium 4.2 3.5 - 5.1 mmol/L   Chloride 107 98 - 111 mmol/L   CO2 17 (L) 22 - 32 mmol/L   Glucose, Bld 91 70 - 99 mg/dL   BUN 142 (H) 6 - 20 mg/dL   Creatinine, Ser 16.49 (H) 0.61 - 1.24 mg/dL   Calcium 7.3 (L) 8.9 - 10.3 mg/dL   Total Protein 6.9 6.5 - 8.1 g/dL   Albumin 3.6 3.5 - 5.0 g/dL   AST 13 (L) 15 - 41 U/L   ALT 17 0 - 44 U/L   Alkaline Phosphatase 88 38 - 126 U/L   Total Bilirubin 0.7 0.3 - 1.2 mg/dL   GFR, Estimated 3 (L) >60 mL/min   Anion gap 15 5 - 15  CBC  Result Value Ref Range   WBC 4.8 4.0 - 10.5 K/uL   RBC 2.43 (L) 4.22 - 5.81 MIL/uL   Hemoglobin 6.7 (LL) 13.0 - 17.0 g/dL   HCT 19.7 (L) 39.0 - 52.0 %   MCV 81.1 80.0 - 100.0 fL   MCH 27.6 26.0 - 34.0 pg   MCHC 34.0 30.0 - 36.0 g/dL   RDW 15.4 11.5 - 15.5 %   Platelets 79 (L) 150 - 400 K/uL   nRBC 0.0 0.0 - 0.2 %  Vitamin B12  Result Value Ref Range   Vitamin B-12 414 180 - 914 pg/mL  Iron and TIBC  Result Value Ref Range   Iron 59 45 - 182 ug/dL   TIBC 335 250 - 450 ug/dL   Saturation Ratios 18 17.9 - 39.5 %   UIBC 276 ug/dL  Ferritin  Result Value Ref Range   Ferritin 232 24 - 336 ng/mL  Reticulocytes  Result Value Ref Range   Retic Ct Pct 0.8 0.4 - 3.1 %   RBC. 2.47 (L) 4.22 - 5.81 MIL/uL   Retic Count, Absolute 20.5 19.0 - 186.0 K/uL   Immature Retic Fract 13.6 2.3 - 15.9 %  Type and screen [place]  Result Value Ref Range   ABO/RH(D) O POS    Antibody Screen NEG    Sample Expiration      12/21/2020,2359 Performed at [facility], [address], [place], [place] [address]   ABO/Rh  Result Value Ref Range   ABO/RH(D)      O POS Performed at [facility]  Lab, [address], [place], [place] [address]      EKG None  Radiology No results  found.  Procedures Procedures   Medications Ordered in ED Medications  hydrALAZINE (APRESOLINE) tablet 25 mg (has no administration in time range)  labetalol (NORMODYNE) injection 20 mg (20 mg Intravenous Given 12/18/20 1208)    ED Course  I have reviewed the triage vital signs and the nursing notes.  Pertinent labs & imaging results that were available during my care of the patient were reviewed by me and considered in my medical decision making (see chart for details).    MDM Rules/Calculators/A&P                          Iv ns. Continuous pulse ox and cardiac monitoring. Stat labs.   Reviewed nursing notes and prior charts for additional history.   Labs reviewed/interpreted by me - bun very high, 142. Hgb low 6.7.   Recheck bp remains very high. Labetalol iv.   Medicine consulted for admission.   BP mildly improved w iv meds. Hydralazine given.  CRITICAL CARE RE: severe hypertension/hypertensive urgency, acute on chronic renal failure, severe anemia. Performed by: Mirna Mires Total critical care time: 40 minutes Critical care time was exclusive of separately billable procedures and treating other patients. Critical care was necessary to treat or prevent imminent or life-threatening deterioration. Critical care was time spent personally by me on the following activities: development of treatment plan with patient and/or surrogate as well as nursing, discussions with consultants, evaluation of patient's response to treatment, examination of patient, obtaining history from patient or surrogate, ordering and performing treatments and interventions, ordering and review of laboratory studies, ordering and review of radiographic studies, pulse oximetry and re-evaluation of patient's condition.  Discussed w IM ROC - will adimt.  [facility] also consulted.    Final Clinical Impression(s) / ED Diagnoses Final diagnoses:  Acute renal failure superimposed on chronic kidney  disease, unspecified CKD stage, unspecified acute renal failure type ([place])  Hypertensive urgency  Severe anemia  Anemia of chronic kidney failure, stage 5 ([place])    Rx / DC Orders ED Discharge Orders     None        Lajean Saver, MD 12/18/20 1351

## 2020-12-18 NOTE — ED Triage Notes (Signed)
Patient sent to ED by PCP for low hemoglobin, no result in Epic. Patient denies rectal bleeding, denies other known source of bleeding. Patient denies pain. Patient alert, oriented, and in no apparent distress at this time.

## 2020-12-18 NOTE — ED Notes (Signed)
Attempted vagal maneuvers (blowing through syringe, raising legs) w/o significant change, HR 143. SVT vs Afib RVR.

## 2020-12-19 ENCOUNTER — Other Ambulatory Visit: Payer: Self-pay

## 2020-12-19 ENCOUNTER — Other Ambulatory Visit (HOSPITAL_COMMUNITY): Payer: Self-pay

## 2020-12-19 DIAGNOSIS — I10 Essential (primary) hypertension: Secondary | ICD-10-CM | POA: Diagnosis not present

## 2020-12-19 DIAGNOSIS — Z992 Dependence on renal dialysis: Secondary | ICD-10-CM | POA: Diagnosis not present

## 2020-12-19 DIAGNOSIS — M109 Gout, unspecified: Secondary | ICD-10-CM

## 2020-12-19 DIAGNOSIS — D696 Thrombocytopenia, unspecified: Secondary | ICD-10-CM

## 2020-12-19 DIAGNOSIS — N186 End stage renal disease: Secondary | ICD-10-CM

## 2020-12-19 DIAGNOSIS — D649 Anemia, unspecified: Secondary | ICD-10-CM | POA: Diagnosis not present

## 2020-12-19 DIAGNOSIS — E872 Acidosis: Secondary | ICD-10-CM

## 2020-12-19 LAB — CBC
HCT: 20.9 % — ABNORMAL LOW (ref 39.0–52.0)
Hemoglobin: 7 g/dL — ABNORMAL LOW (ref 13.0–17.0)
MCH: 27.2 pg (ref 26.0–34.0)
MCHC: 33.5 g/dL (ref 30.0–36.0)
MCV: 81.3 fL (ref 80.0–100.0)
Platelets: 81 10*3/uL — ABNORMAL LOW (ref 150–400)
RBC: 2.57 MIL/uL — ABNORMAL LOW (ref 4.22–5.81)
RDW: 15.1 % (ref 11.5–15.5)
WBC: 4.8 10*3/uL (ref 4.0–10.5)
nRBC: 0 % (ref 0.0–0.2)

## 2020-12-19 LAB — COMPREHENSIVE METABOLIC PANEL
ALT: 19 U/L (ref 0–44)
AST: 13 U/L — ABNORMAL LOW (ref 15–41)
Albumin: 3.4 g/dL — ABNORMAL LOW (ref 3.5–5.0)
Alkaline Phosphatase: 78 U/L (ref 38–126)
Anion gap: 14 (ref 5–15)
BUN: 143 mg/dL — ABNORMAL HIGH (ref 6–20)
CO2: 19 mmol/L — ABNORMAL LOW (ref 22–32)
Calcium: 7.2 mg/dL — ABNORMAL LOW (ref 8.9–10.3)
Chloride: 107 mmol/L (ref 98–111)
Creatinine, Ser: 16.78 mg/dL — ABNORMAL HIGH (ref 0.61–1.24)
GFR, Estimated: 3 mL/min — ABNORMAL LOW (ref >60)
Glucose, Bld: 154 mg/dL — ABNORMAL HIGH (ref 70–99)
Potassium: 3.9 mmol/L (ref 3.5–5.1)
Sodium: 140 mmol/L (ref 135–145)
Total Bilirubin: 0.3 mg/dL (ref 0.3–1.2)
Total Protein: 6.6 g/dL (ref 6.5–8.1)

## 2020-12-19 LAB — MICROALBUMIN / CREATININE URINE RATIO
Creatinine, Urine: 46.9 mg/dL
Microalb Creat Ratio: 1590 mg/g creat — ABNORMAL HIGH (ref 0–29)
Microalb, Ur: 745.8 ug/mL — ABNORMAL HIGH

## 2020-12-19 LAB — HEMOGLOBIN AND HEMATOCRIT, BLOOD
HCT: 23.6 % — ABNORMAL LOW (ref 39.0–52.0)
Hemoglobin: 7.9 g/dL — ABNORMAL LOW (ref 13.0–17.0)

## 2020-12-19 LAB — PARATHYROID HORMONE, INTACT (NO CA): PTH: 679 pg/mL — ABNORMAL HIGH (ref 15–65)

## 2020-12-19 LAB — PREPARE RBC (CROSSMATCH)

## 2020-12-19 MED ORDER — SODIUM CHLORIDE 0.9% IV SOLUTION: Freq: Once | INTRAVENOUS | Status: AC

## 2020-12-19 MED ORDER — VITAMIN D3 25 MCG PO TABS
1000.0000 [IU] | ORAL_TABLET | Freq: Every day | ORAL | 0 refills | Status: AC
  Filled 2020-12-19: qty 30, 30d supply, fill #0

## 2020-12-19 MED ORDER — DARBEPOETIN ALFA 200 MCG/0.4ML IJ SOSY
200.0000 ug | PREFILLED_SYRINGE | Freq: Once | INTRAMUSCULAR | Status: AC
  Administered 2020-12-19: 200 ug via SUBCUTANEOUS
  Filled 2020-12-19: qty 0.4

## 2020-12-19 MED ORDER — HYDRALAZINE HCL 50 MG PO TABS
50.0000 mg | ORAL_TABLET | Freq: Three times a day (TID) | ORAL | Status: DC
  Administered 2020-12-19: 50 mg via ORAL
  Filled 2020-12-19: qty 1

## 2020-12-19 MED ORDER — VITAMIN D 25 MCG (1000 UNIT) PO TABS
1000.0000 [IU] | ORAL_TABLET | Freq: Every day | ORAL | Status: DC
  Administered 2020-12-19: 1000 [IU] via ORAL
  Filled 2020-12-19: qty 1

## 2020-12-19 MED ORDER — CHLORTHALIDONE 25 MG PO TABS
25.0000 mg | ORAL_TABLET | Freq: Every day | ORAL | Status: DC
  Administered 2020-12-19: 25 mg via ORAL
  Filled 2020-12-19: qty 1

## 2020-12-19 NOTE — Progress Notes (Signed)
Pt arrived to the floor via the stretcher,alert and oriented and ambulated to the room. VS obtained, CHG bath given. Patient oriented to staff, room and equipment, Cardiac monitoring initiated.

## 2020-12-19 NOTE — Progress Notes (Signed)
D/C instructions given to patient. Medications reviewed. IV removed, clean and intact.   Clyde Canterbury, RN

## 2020-12-19 NOTE — Discharge Summary (Signed)
Name: Antonio Keller MRN: [id number] DOB: 29-Dec-1960 60 y.o. PCP: Jolinda Croak, MD  Date of Admission: 12/18/2020  8:45 AM Date of Discharge:  12/19/20 Attending Physician: Dr. Angelia Mould  DISCHARGE DIAGNOSIS:  Primary Problem: Asymptomatic severe hypertension Hospital Problems: Active Problems:   Severe uncontrolled hypertension  Acute on chronic anemia ESRD not on HD Metabolic acidosis Gout Thrombocytopenia  DISCHARGE MEDICATIONS:   Allergies as of 12/19/2020   No Known Allergies      Medication List     TAKE these medications    allopurinol 100 MG tablet Commonly known as: ZYLOPRIM Take 100 mg by mouth daily.   carvedilol 25 MG tablet Commonly known as: COREG Take 25 mg by mouth 2 (two) times daily.   chlorthalidone 25 MG tablet Commonly known as: HYGROTON Take 25 mg by mouth daily.   hydrALAZINE 50 MG tablet Commonly known as: APRESOLINE Take 1 tablet (50 mg total) by mouth 3 (three) times daily.   Vitamin D3 25 MCG tablet Commonly known as: Vitamin D Take 1 tablet (1,000 Units total) by mouth daily. Start taking on: December 20, 2020        DISPOSITION AND FOLLOW-UP:  Antonio Keller was discharged from [facility] in Stable condition. At the hospital follow up visit please address:  Follow-up Recommendations: Consults:None Labs:  PTH, CBC, CMP Studies: None Medications: None  Follow-up Appointments:  Follow up with PCP in one week Follow up with established nephrologist in 6 weeks, [facility], Dr. Ferdie Ping COURSE:  Patient Summary: Antonio Keller is a 60 year old male with a pertinent past medical history of HTN, ESRD w/ CKD stage 5, and gout who presented with a hgb of 6.7 and severe asymptomatic HTN.    Severe Asymptomatic Hypertension Presented with blood pressures elevated (SBP 210s and DBPs 100s), remained asymptomatic. Patient was restarted on home Carvedilol, Hydral, and  Chlorthalidone which lowered BP to systolic [id number]. Patient continued on home BP medications on discharge.   ESRD not on dialysis Tertiary hyperparathyroidism Non-anion gap metabolic acidosis Patient presented with a Cr 16.49, and Bun 142, Co2 17 , hyperphosphatemia,and Ca 7.3. Mentation was at baseline, and patient denied abdominal pain, and chest pain. Phosphorus was also elevated. Nephrology was consulted and they recommended patient strongly consider dialysis for his case moving forward but that dialysis is not indicated urgently at this moment. Kidney function remained stable during course of admission. Renal ultrasound showed kidney changes associated with CKD. UA was unremarkable except for proteinuria. Will follow-up with established nephrologist. Recommend for renal function labs at time of follow up.   Acute on chronic anemia in setting of iron deficiency anemia and anemia of chronic disease 2/2 chronic renal disease  Patient presented with with Hgb of 6.7 on admission. Likely due to CKD given no concerns for acute bleed. Given 2u pRBC with Epo as well. Peripheral smear showed target cells. Recommend f/u CBC at follow up visit.   Tachycardia Patient presented with HR elevated 130s-150s. Remained asymptomatic. EKG unclear for SVT vs Afib w/ RVR. Rates improved s/p blood transfusion x1. Electrolytes were repleted as indicated.   Low Vitamin D Patient's vitamin D levels decreased, in the setting of ESRD. PTH labs were pending by the time of discharge. Patient was started on Vitamin D supplementation as per nephrology recommendations.   DISCHARGE INSTRUCTIONS:  FOLLOW-UP INSTRUCTIONS:  Thank you for allowing [place] to be part of your care. You were hospitalized for <principal problem  not specified>.  Please follow up with the following providers: A. Jolinda Croak, MD, [place] / [place] [address], [phone]  B. Dr. Moshe Cipro, Nephrology, in 6  weeks  Please note these changes made to your medications:   A. Medications to continue: carvedilol (COREG) tablet 25 mg, 25 mg, Oral, twice daily chlorthalidone (HYGROTON) tablet 25 mg,  Oral, once Dail hydrALAZINE (APRESOLINE) tablet 50 mg Three times daily  Allopurinol 100 mg tablet, take 100 mg by mouth daily  B. Medications to start:  cholecalciferol (VITAMIN D3) tablet 1,000 Units, Oral, once Daily  C. Medications to discontinue: None   Please make sure to follow up with your nephrologist and your primary care doctors to make sure your blood pressures are well-controlled. If you develop fatigue, chest pain, palpitations, dizziness, headaches, or vision changes, blurriness, or confusion please go to your nearest emergency room.  Please call our clinic if you have any questions or concerns, we may be able to help and keep you from a long and expensive emergency room wait. Our clinic and after hours phone number is [phone], the best time to call is Monday through Friday 9 am to 4 pm but there is always someone available 24/7 if you have an emergency. If you need medication refills please notify your pharmacy one week in advance and they will send [place] a request.   SUBJECTIVE:  Antonio Keller was seen at bedside today. He states that he is doing fine today. He denies chest pain, vision changes, headaches, abdominal pain, hematuria or hematochezia. He is amenable to going home today. Discharge Vitals:   BP (!) 165/105   Pulse 73   Temp 97.6 F (36.4 C) (Oral)   Resp 14   Ht 6' (1.829 m)   Wt 80.6 kg   SpO2 100%   BMI 24.10 kg/m   OBJECTIVE:  Constitutional: well-appearing male sitting in bed, in no acute distress HENT: normocephalic atraumatic Eyes: No scleral icterus Cardiovascular: regular rate and rhythm, no pitting edema in BLEs Pulmonary/Chest: normal work of breathing on room air MSK: normal bulk and tone, no focal deficits Neurological: alert, answers questions  appropriately Psych: affect mood congruent   Pertinent Labs, Studies, and Procedures:  CBC Latest Ref Rng & Units 12/19/2020 12/18/2020 12/18/2020  WBC 4.0 - 10.5 K/uL 4.8 4.5 4.8  Hemoglobin 13.0 - 17.0 g/dL 7.0(L) 7.1(L) 6.7(LL)  Hematocrit 39.0 - 52.0 % 20.9(L) 21.2(L) 19.7(L)  Platelets 150 - 400 K/uL 81(L) 77(L) 79(L)    CMP Latest Ref Rng & Units 12/19/2020 12/18/2020  Glucose 70 - 99 mg/dL 154(H) 91  BUN 6 - 20 mg/dL 143(H) 142(H)  Creatinine 0.61 - 1.24 mg/dL 16.78(H) 16.49(H)  Sodium 135 - 145 mmol/L 140 139  Potassium 3.5 - 5.1 mmol/L 3.9 4.2  Chloride 98 - 111 mmol/L 107 107  CO2 22 - 32 mmol/L 19(L) 17(L)  Calcium 8.9 - 10.3 mg/dL 7.2(L) 7.3(L)  Total Protein 6.5 - 8.1 g/dL 6.6 6.9  Total Bilirubin 0.3 - 1.2 mg/dL 0.3 0.7  Alkaline Phos 38 - 126 U/L 78 88  AST 15 - 41 U/L 13(L) 13(L)  ALT 0 - 44 U/L 19 17    US RENAL  Result Date: 12/18/2020 CLINICAL DATA:  Chronic kidney disease. EXAM: RENAL / URINARY TRACT ULTRASOUND COMPLETE COMPARISON:  None. FINDINGS: Right Kidney: Renal measurements: 7.9 x 3.7 x 4.9 cm = volume: 75 mL. Mild atrophy with generalized increased parenchymal echogenicity. No hydronephrosis. Subcentimeter cyst  in the lower pole. No evidence of solid lesion or stone. Left Kidney: Renal measurements: 8.0 x 3.9 x 4.4 cm = volume: 79 mL. Mild atrophy. Diffusely increased parenchymal echogenicity. No hydronephrosis. No evidence of focal lesion or stone. Bladder: Appears normal for degree of bladder distention. Neither ureteral jet is seen. Other: None. IMPRESSION: Renal atrophy and increased parenchymal echogenicity most consistent with chronic medical renal disease. No hydronephrosis. Electronically Signed   By: Keith Rake M.D.   On: 12/18/2020 16:55   DG Chest Port 1 View  Result Date: 12/18/2020 CLINICAL DATA:  Weakness.  No chest complaints. EXAM: PORTABLE CHEST 1 VIEW COMPARISON:  Chest x-ray dated January 03, 2013. FINDINGS: Heart size upper limits of  normal, accentuated by technique. Normal pulmonary vascularity. No focal consolidation, pleural effusion, or pneumothorax. No acute osseous abnormality. IMPRESSION: No active disease. Electronically Signed   By: Titus Dubin M.D.   On: 12/18/2020 14:17    Ok Edwards, Medical Student 2:04 PM, 12/19/2020     Attestation for Student Documentation:  I personally was present and performed or re-performed the history, physical exam and medical decision-making activities of this service and have verified that the service and findings are accurately documented in the student's note.  Harvie Heck, MD IMTS PGY-3 12/19/2020, 2:04 PM

## 2020-12-19 NOTE — Progress Notes (Addendum)
Subjective:   Patient reports he did not have any symptoms but he feel better today. He would like to address the acute anemia while her and discuss HD at another time. Still not ready. BP still elevated but HR improved. BUN/sCr of 143/16.78.   Objective Vital signs in last 24 hours: Vitals:   12/18/20 2149 12/18/20 2200 12/19/20 0011 12/19/20 0500  BP: (!) 187/121 (!) 168/114 (!) 178/106   Pulse: 72 79 98   Resp: '18 13 20   '$ Temp: 98.6 F (37 C)  98.6 F (37 C)   TempSrc: Oral  Oral   SpO2: 100% 100% 100%   Weight: 80.5 kg   80.6 kg  Height: 6' (1.829 m)   6' (1.829 m)   Weight change:  No intake or output data in the 24 hours ending 12/19/20 0739  Assessment/ Plan: Pt is a 60 y.o. yo male w/ CKD stage V not on dialysis, uncontrolled hypertension, gout, IDA, anemia of chronic disease and tertiary hyperparathyroidism who was admitted on 12/18/2020 with  acute on chronic anemia.  Assessment/Plan:   1.  ESRD not on dialysis -patient with history of progressive CKD since 2015.  Creatinine has ranged from 2.5 in 2015 to 4.3 in 2020 then double digits since 2020.  Patient follows with [facility] and multiple providers have had discussions with patient about starting dialysis however patient has refused every time due to not feeling bad and a loved one's traumatic experience with dialysis.  Found to have a creatinine of 16 with a BUN of 142 patient continues to denies any symptoms. Kidney function relatively unchanged. UA shows mild proteinuria             -- No plans for HD inpatient as patient still not ready  -- Can discharge home after blood transfusion from renal perspective  -- Schedule to follow up with Dr. Moshe Cipro in 6 weeks.   -- Will need 1 week hospital f/u with PCP, interested in switching to [facility] 2. Hypertension/volume  --Patient with uncontrolled hypertension on multiple antihypertensives including hydralazine 50 mg 3 times daily, Coreg 25 mg twice daily, chlorthalidone  25 mg daily. BP still elevated with SBP in the 150-170s.   -- Resume home antihypertensives 4.  Acute on chronic anemia (IDA, AoCD) -Patient with a history of iron deficiency anemia and anemia of chronic disease secondary to CKD stage V. Baseline hemoglobin 7-9 since 2020. Found to have hemoglobin 6.7 on arrival. Iron studies within normal limits. S/p 1 u pRBC with hgb only increased to 7.0. States he feels better today but was not symptomatic.   -- Repeat 1 unit pRBC  -- Aranesp 200 mcg x1 dose  -- Can discharge home after blood transfusion, will need to f/u with PCP next week to recheck CBC.  5.  Metabolic acidosis --Improving. CO2 17->19. Continue sodium bicarb 650 TID. Can discontinue at discharge.  6. Bones: Patient with a history of tertiary hyperparathyroidism. Intact PTH of 302 and vitamin D 37 in 2020. Vitamin D levels low at 26.07             --Pending PTH levels  --Start cholecalciferol 1000 units daily 7.  New onset A. Fib --Resolved. Tele shows NSR with HR in the 70-80s.  8.  Gout --Continue allopurinol 9. Thrombocytopenia -- Plt of 81. Smear shows target cells. Likey 22 his IDA.  Lacinda Axon   Patient seen and examined, agree with above note with above modifications. Pt says he feels better  but then quickly says that he never felt badly-  hgb did not bump significantly with one unit PRBC.  I would recommend another unit of blood to be given, will also give aranesp 200 mgc subq.  He is not willing to entertain the thought of starting dialysis at this time-  wife called me concerned and she thinks she will be abe to talk him into it but I am not sure.  I am planning on seeing him every 6 weeks or so as an OP and I will continue with these conversations-  he is very aware that dialysis is recommended for him  Corliss Parish, MD 12/19/2020     Labs: Basic Metabolic Panel: Recent Labs  Lab 12/18/20 0852 12/18/20 1510 12/19/20 0152  NA 139  --  140  K 4.2  --  3.9   CL 107  --  107  CO2 17*  --  19*  GLUCOSE 91  --  154*  BUN 142*  --  143*  CREATININE 16.49*  --  16.78*  CALCIUM 7.3*  --  7.2*  PHOS  --  8.3*  --    Liver Function Tests: Recent Labs  Lab 12/18/20 0852 12/19/20 0152  AST 13* 13*  ALT 17 19  ALKPHOS 88 78  BILITOT 0.7 0.3  PROT 6.9 6.6  ALBUMIN 3.6 3.4*   No results for input(s): LIPASE, AMYLASE in the last 168 hours. No results for input(s): AMMONIA in the last 168 hours. CBC: Recent Labs  Lab 12/18/20 0852 12/18/20 2130 12/19/20 0152  WBC 4.8 4.5 4.8  HGB 6.7* 7.1* 7.0*  HCT 19.7* 21.2* 20.9*  MCV 81.1 82.5 81.3  PLT 79* 77* 81*   Cardiac Enzymes: No results for input(s): CKTOTAL, CKMB, CKMBINDEX, TROPONINI in the last 168 hours. CBG: No results for input(s): GLUCAP in the last 168 hours.  Iron Studies:  Recent Labs    12/18/20 1128  IRON 59  TIBC 335  FERRITIN 232   Studies/Results: US RENAL  Result Date: 12/18/2020 CLINICAL DATA:  Chronic kidney disease. EXAM: RENAL / URINARY TRACT ULTRASOUND COMPLETE COMPARISON:  None. FINDINGS: Right Kidney: Renal measurements: 7.9 x 3.7 x 4.9 cm = volume: 75 mL. Mild atrophy with generalized increased parenchymal echogenicity. No hydronephrosis. Subcentimeter cyst in the lower pole. No evidence of solid lesion or stone. Left Kidney: Renal measurements: 8.0 x 3.9 x 4.4 cm = volume: 79 mL. Mild atrophy. Diffusely increased parenchymal echogenicity. No hydronephrosis. No evidence of focal lesion or stone. Bladder: Appears normal for degree of bladder distention. Neither ureteral jet is seen. Other: None. IMPRESSION: Renal atrophy and increased parenchymal echogenicity most consistent with chronic medical renal disease. No hydronephrosis. Electronically Signed   By: Keith Rake M.D.   On: 12/18/2020 16:55   DG Chest Port 1 View  Result Date: 12/18/2020 CLINICAL DATA:  Weakness.  No chest complaints. EXAM: PORTABLE CHEST 1 VIEW COMPARISON:  Chest x-ray dated January 03, 2013. FINDINGS: Heart size upper limits of normal, accentuated by technique. Normal pulmonary vascularity. No focal consolidation, pleural effusion, or pneumothorax. No acute osseous abnormality. IMPRESSION: No active disease. Electronically Signed   By: Titus Dubin M.D.   On: 12/18/2020 14:17   Medications: Infusions:   Scheduled Medications:  sodium chloride   Intravenous Once   carvedilol  25 mg Oral BID WC   hydrALAZINE  10 mg Oral Q8H   sodium bicarbonate  650 mg Oral TID   sodium chloride flush  3 mL  Intravenous Q12H    have reviewed scheduled and prn medications.  Physical Exam: General: Pleasant, well-appearing man laying in bed. No acute distress. Head: Normocephalic. Atraumatic. CV: RRR. No murmurs, rubs, or gallops. No LE edema Pulmonary: Lungs CTAB. Normal effort. No wheezing or rales. Abdominal: Soft, nontender, nondistended. Normal bowel sounds. Extremities: Palpable radial and DP pulses. Normal ROM. Skin: Warm and dry. No obvious rash or lesions. Neuro: A&Ox3. Moves all extremities. Normal sensation. No focal deficit. Psych: Normal mood and affect   12/19/2020,7:39 AM  LOS: 0 days

## 2020-12-19 NOTE — Discharge Instructions (Signed)
FOLLOW-UP INSTRUCTIONS:  Thank you for allowing [place] to be part of your care. You were hospitalized for Asymptomatic hypertension and low hemoglobin.  Please follow up with the following providers: A. Jolinda Croak, MD, [place] / [place] [address], [phone]  B. Dr. Moshe Cipro, Nephrology, in 6 weeks  Please note these changes made to your medications:   A. Medications to continue: carvedilol (COREG) tablet 25 mg, 25 mg, Oral, twice daily chlorthalidone (HYGROTON) tablet 25 mg,  Oral, once Dail hydrALAZINE (APRESOLINE) tablet 50 mg Three times daily  Allopurinol 100 mg tablet, take 100 mg by mouth daily  B. Medications to start:  cholecalciferol (VITAMIN D3) tablet 1,000 Units, Oral, once Daily  C. Medications to discontinue: None   Please make sure to follow up with your nephrologist and your primary care doctors to make sure your blood pressures are well-controlled. If you develop fatigue, chest pain, palpitations, dizziness, headaches, or vision changes, blurriness, or confusion please go to your nearest emergency room.  Please call our clinic if you have any questions or concerns, we may be able to help and keep you from a long and expensive emergency room wait. Our clinic and after hours phone number is [phone], the best time to call is Monday through Friday 9 am to 4 pm but there is always someone available 24/7 if you have an emergency. If you need medication refills please notify your pharmacy one week in advance and they will send [place] a request.

## 2020-12-19 NOTE — Plan of Care (Signed)
POC initiated and progressing. 

## 2020-12-20 ENCOUNTER — Other Ambulatory Visit (HOSPITAL_COMMUNITY): Payer: Self-pay

## 2020-12-20 LAB — TYPE AND SCREEN
ABO/RH(D): O POS
Antibody Screen: NEGATIVE
Unit division: 0
Unit division: 0

## 2020-12-20 LAB — BPAM RBC
Blood Product Expiration Date: 202208212359
Blood Product Expiration Date: 202208212359
ISSUE DATE / TIME: 202207201707
ISSUE DATE / TIME: 202207211118
Unit Type and Rh: 5100
Unit Type and Rh: 5100

## 2020-12-20 LAB — PATHOLOGIST SMEAR REVIEW

## 2021-02-14 ENCOUNTER — Inpatient Hospital Stay (HOSPITAL_COMMUNITY)
Admission: EM | Admit: 2021-02-14 | Discharge: 2021-02-22 | DRG: 291 | Disposition: A | Discharge status: Home / Self Care | Payer: BC Managed Care – PPO | Attending: Student | Admitting: Student

## 2021-02-14 ENCOUNTER — Emergency Department (HOSPITAL_COMMUNITY): Payer: BC Managed Care – PPO

## 2021-02-14 ENCOUNTER — Inpatient Hospital Stay (HOSPITAL_COMMUNITY): Payer: BC Managed Care – PPO

## 2021-02-14 ENCOUNTER — Other Ambulatory Visit: Payer: Self-pay

## 2021-02-14 ENCOUNTER — Encounter (HOSPITAL_COMMUNITY): Payer: Self-pay

## 2021-02-14 DIAGNOSIS — B1910 Unspecified viral hepatitis B without hepatic coma: Secondary | ICD-10-CM | POA: Diagnosis not present

## 2021-02-14 DIAGNOSIS — I5020 Unspecified systolic (congestive) heart failure: Secondary | ICD-10-CM | POA: Diagnosis not present

## 2021-02-14 DIAGNOSIS — I248 Other forms of acute ischemic heart disease: Secondary | ICD-10-CM | POA: Diagnosis present

## 2021-02-14 DIAGNOSIS — R000 Tachycardia, unspecified: Secondary | ICD-10-CM

## 2021-02-14 DIAGNOSIS — D61818 Other pancytopenia: Secondary | ICD-10-CM | POA: Diagnosis present

## 2021-02-14 DIAGNOSIS — B169 Acute hepatitis B without delta-agent and without hepatic coma: Secondary | ICD-10-CM | POA: Diagnosis present

## 2021-02-14 DIAGNOSIS — Z20822 Contact with and (suspected) exposure to covid-19: Secondary | ICD-10-CM | POA: Diagnosis present

## 2021-02-14 DIAGNOSIS — Z992 Dependence on renal dialysis: Secondary | ICD-10-CM | POA: Diagnosis not present

## 2021-02-14 DIAGNOSIS — R778 Other specified abnormalities of plasma proteins: Secondary | ICD-10-CM

## 2021-02-14 DIAGNOSIS — D631 Anemia in chronic kidney disease: Secondary | ICD-10-CM | POA: Diagnosis present

## 2021-02-14 DIAGNOSIS — I5041 Acute combined systolic (congestive) and diastolic (congestive) heart failure: Secondary | ICD-10-CM | POA: Diagnosis not present

## 2021-02-14 DIAGNOSIS — I5042 Chronic combined systolic (congestive) and diastolic (congestive) heart failure: Secondary | ICD-10-CM | POA: Diagnosis not present

## 2021-02-14 DIAGNOSIS — I5021 Acute systolic (congestive) heart failure: Secondary | ICD-10-CM | POA: Diagnosis present

## 2021-02-14 DIAGNOSIS — I10 Essential (primary) hypertension: Secondary | ICD-10-CM | POA: Diagnosis present

## 2021-02-14 DIAGNOSIS — N189 Chronic kidney disease, unspecified: Secondary | ICD-10-CM | POA: Diagnosis not present

## 2021-02-14 DIAGNOSIS — E8729 Other acidosis: Secondary | ICD-10-CM

## 2021-02-14 DIAGNOSIS — D696 Thrombocytopenia, unspecified: Secondary | ICD-10-CM | POA: Diagnosis not present

## 2021-02-14 DIAGNOSIS — M898X9 Other specified disorders of bone, unspecified site: Secondary | ICD-10-CM | POA: Diagnosis present

## 2021-02-14 DIAGNOSIS — R1084 Generalized abdominal pain: Secondary | ICD-10-CM

## 2021-02-14 DIAGNOSIS — R7989 Other specified abnormal findings of blood chemistry: Secondary | ICD-10-CM | POA: Diagnosis not present

## 2021-02-14 DIAGNOSIS — R112 Nausea with vomiting, unspecified: Secondary | ICD-10-CM | POA: Diagnosis present

## 2021-02-14 DIAGNOSIS — E872 Acidosis: Secondary | ICD-10-CM

## 2021-02-14 DIAGNOSIS — D638 Anemia in other chronic diseases classified elsewhere: Secondary | ICD-10-CM

## 2021-02-14 DIAGNOSIS — E876 Hypokalemia: Secondary | ICD-10-CM | POA: Diagnosis not present

## 2021-02-14 DIAGNOSIS — I440 Atrioventricular block, first degree: Secondary | ICD-10-CM | POA: Diagnosis present

## 2021-02-14 DIAGNOSIS — R109 Unspecified abdominal pain: Secondary | ICD-10-CM

## 2021-02-14 DIAGNOSIS — I4891 Unspecified atrial fibrillation: Secondary | ICD-10-CM | POA: Diagnosis not present

## 2021-02-14 DIAGNOSIS — N179 Acute kidney failure, unspecified: Secondary | ICD-10-CM | POA: Diagnosis present

## 2021-02-14 DIAGNOSIS — D649 Anemia, unspecified: Secondary | ICD-10-CM | POA: Diagnosis not present

## 2021-02-14 DIAGNOSIS — I480 Paroxysmal atrial fibrillation: Secondary | ICD-10-CM | POA: Diagnosis present

## 2021-02-14 DIAGNOSIS — N185 Chronic kidney disease, stage 5: Secondary | ICD-10-CM | POA: Insufficient documentation

## 2021-02-14 DIAGNOSIS — N178 Other acute kidney failure: Secondary | ICD-10-CM | POA: Diagnosis not present

## 2021-02-14 DIAGNOSIS — I132 Hypertensive heart and chronic kidney disease with heart failure and with stage 5 chronic kidney disease, or end stage renal disease: Principal | ICD-10-CM | POA: Diagnosis present

## 2021-02-14 DIAGNOSIS — I509 Heart failure, unspecified: Secondary | ICD-10-CM | POA: Diagnosis not present

## 2021-02-14 DIAGNOSIS — I5043 Acute on chronic combined systolic (congestive) and diastolic (congestive) heart failure: Secondary | ICD-10-CM | POA: Diagnosis present

## 2021-02-14 DIAGNOSIS — R9431 Abnormal electrocardiogram [ECG] [EKG]: Secondary | ICD-10-CM | POA: Diagnosis not present

## 2021-02-14 DIAGNOSIS — N186 End stage renal disease: Secondary | ICD-10-CM | POA: Diagnosis present

## 2021-02-14 DIAGNOSIS — R748 Abnormal levels of other serum enzymes: Secondary | ICD-10-CM

## 2021-02-14 LAB — I-STAT ARTERIAL BLOOD GAS, ED
Acid-base deficit: 14 mmol/L — ABNORMAL HIGH (ref 0.0–2.0)
Bicarbonate: 12.5 mmol/L — ABNORMAL LOW (ref 20.0–28.0)
Calcium, Ion: 0.85 mmol/L — CL (ref 1.15–1.40)
HCT: 25 % — ABNORMAL LOW (ref 39.0–52.0)
Hemoglobin: 8.5 g/dL — ABNORMAL LOW (ref 13.0–17.0)
O2 Saturation: 98 %
Patient temperature: 98.6
Potassium: 4.1 mmol/L (ref 3.5–5.1)
Sodium: 135 mmol/L (ref 135–145)
TCO2: 13 mmol/L — ABNORMAL LOW (ref 22–32)
pCO2 arterial: 30 mmHg — ABNORMAL LOW (ref 32.0–48.0)
pH, Arterial: 7.228 — ABNORMAL LOW (ref 7.350–7.450)
pO2, Arterial: 114 mmHg — ABNORMAL HIGH (ref 83.0–108.0)

## 2021-02-14 LAB — CBC WITH DIFFERENTIAL/PLATELET
Abs Immature Granulocytes: 0.01 10*3/uL (ref 0.00–0.07)
Basophils Absolute: 0 10*3/uL (ref 0.0–0.1)
Basophils Relative: 1 %
Eosinophils Absolute: 0 10*3/uL (ref 0.0–0.5)
Eosinophils Relative: 1 %
HCT: 23.6 % — ABNORMAL LOW (ref 39.0–52.0)
Hemoglobin: 7.8 g/dL — ABNORMAL LOW (ref 13.0–17.0)
Immature Granulocytes: 0 %
Lymphocytes Relative: 37 %
Lymphs Abs: 1.2 10*3/uL (ref 0.7–4.0)
MCH: 26.7 pg (ref 26.0–34.0)
MCHC: 33.1 g/dL (ref 30.0–36.0)
MCV: 80.8 fL (ref 80.0–100.0)
Monocytes Absolute: 0.4 10*3/uL (ref 0.1–1.0)
Monocytes Relative: 13 %
Neutro Abs: 1.6 10*3/uL — ABNORMAL LOW (ref 1.7–7.7)
Neutrophils Relative %: 48 %
Platelets: 82 10*3/uL — ABNORMAL LOW (ref 150–400)
RBC: 2.92 MIL/uL — ABNORMAL LOW (ref 4.22–5.81)
RDW: 15.7 % — ABNORMAL HIGH (ref 11.5–15.5)
WBC: 3.2 10*3/uL — ABNORMAL LOW (ref 4.0–10.5)
nRBC: 0 % (ref 0.0–0.2)

## 2021-02-14 LAB — COMPREHENSIVE METABOLIC PANEL
ALT: 29 U/L (ref 0–44)
AST: 36 U/L (ref 15–41)
Albumin: 3.6 g/dL (ref 3.5–5.0)
Alkaline Phosphatase: 79 U/L (ref 38–126)
Anion gap: 21 — ABNORMAL HIGH (ref 5–15)
BUN: 180 mg/dL — ABNORMAL HIGH (ref 6–20)
CO2: 13 mmol/L — ABNORMAL LOW (ref 22–32)
Calcium: 6.5 mg/dL — ABNORMAL LOW (ref 8.9–10.3)
Chloride: 101 mmol/L (ref 98–111)
Creatinine, Ser: 27.29 mg/dL — ABNORMAL HIGH (ref 0.61–1.24)
GFR, Estimated: 2 mL/min — ABNORMAL LOW (ref >60)
Glucose, Bld: 89 mg/dL (ref 70–99)
Potassium: 4.2 mmol/L (ref 3.5–5.1)
Sodium: 135 mmol/L (ref 135–145)
Total Bilirubin: 0.7 mg/dL (ref 0.3–1.2)
Total Protein: 6.9 g/dL (ref 6.5–8.1)

## 2021-02-14 LAB — RESP PANEL BY RT-PCR (FLU A&B, COVID) ARPGX2
Influenza A by PCR: NEGATIVE
Influenza B by PCR: NEGATIVE
SARS Coronavirus 2 by RT PCR: NEGATIVE

## 2021-02-14 LAB — PROTIME-INR
INR: 1.3 — ABNORMAL HIGH (ref 0.8–1.2)
Prothrombin Time: 16.3 seconds — ABNORMAL HIGH (ref 11.4–15.2)

## 2021-02-14 LAB — LACTIC ACID, PLASMA: Lactic Acid, Venous: 0.7 mmol/L (ref 0.5–1.9)

## 2021-02-14 LAB — TROPONIN I (HIGH SENSITIVITY)
Troponin I (High Sensitivity): 99 ng/L — ABNORMAL HIGH (ref <18)
Troponin I (High Sensitivity): 82 ng/L — ABNORMAL HIGH (ref <18)

## 2021-02-14 LAB — LIPASE, BLOOD: Lipase: 216 U/L — ABNORMAL HIGH (ref 11–51)

## 2021-02-14 LAB — MAGNESIUM: Magnesium: 2 mg/dL (ref 1.7–2.4)

## 2021-02-14 MED ORDER — ADENOSINE 6 MG/2ML IV SOLN
12.0000 mg | Freq: Once | INTRAVENOUS | Status: AC
  Administered 2021-02-14: 12 mg via INTRAVENOUS

## 2021-02-14 MED ORDER — DOCUSATE SODIUM 100 MG PO CAPS: 100.0000 mg | ORAL_CAPSULE | Freq: Two times a day (BID) | ORAL | Status: DC | PRN

## 2021-02-14 MED ORDER — TRIMETHOBENZAMIDE HCL 100 MG/ML IM SOLN
200.0000 mg | Freq: Once | INTRAMUSCULAR | Status: DC
  Filled 2021-02-14: qty 2

## 2021-02-14 MED ORDER — AMIODARONE HCL IN DEXTROSE 360-4.14 MG/200ML-% IV SOLN
30.0000 mg/h | INTRAVENOUS | Status: DC
  Administered 2021-02-15 – 2021-02-16 (×3): 30 mg/h via INTRAVENOUS
  Filled 2021-02-14 (×3): qty 200

## 2021-02-14 MED ORDER — POLYETHYLENE GLYCOL 3350 17 G PO PACK: 17.0000 g | PACK | Freq: Every day | ORAL | Status: DC | PRN

## 2021-02-14 MED ORDER — LIDOCAINE BOLUS VIA INFUSION
50.0000 mg | Freq: Once | INTRAVENOUS | Status: DC
  Filled 2021-02-14: qty 52

## 2021-02-14 MED ORDER — SODIUM BICARBONATE 8.4 % IV SOLN
INTRAVENOUS | Status: DC
  Filled 2021-02-14 (×5): qty 1000

## 2021-02-14 MED ORDER — AMIODARONE IV BOLUS ONLY 150 MG/100ML
150.0000 mg | Freq: Once | INTRAVENOUS | Status: DC
  Filled 2021-02-14 (×2): qty 100

## 2021-02-14 MED ORDER — ONDANSETRON HCL 4 MG/2ML IJ SOLN: 4.0000 mg | Freq: Once | INTRAMUSCULAR | Status: DC

## 2021-02-14 MED ORDER — ADENOSINE 6 MG/2ML IV SOLN
INTRAVENOUS | Status: AC
  Administered 2021-02-14: 6 mg
  Filled 2021-02-14: qty 6

## 2021-02-14 MED ORDER — LIDOCAINE IN D5W 4-5 MG/ML-% IV SOLN
0.5000 mg/min | INTRAVENOUS | Status: DC
  Filled 2021-02-14: qty 500

## 2021-02-14 MED ORDER — AMIODARONE HCL IN DEXTROSE 360-4.14 MG/200ML-% IV SOLN
60.0000 mg/h | INTRAVENOUS | Status: AC
  Administered 2021-02-14: 60 mg/h via INTRAVENOUS

## 2021-02-14 MED ORDER — PANTOPRAZOLE SODIUM 40 MG IV SOLR
40.0000 mg | Freq: Every day | INTRAVENOUS | Status: DC
  Administered 2021-02-15 (×2): 40 mg via INTRAVENOUS
  Filled 2021-02-14 (×2): qty 40

## 2021-02-14 NOTE — ED Provider Notes (Signed)
Emergency Medicine Provider Triage Evaluation Note  Antonio Keller , a 60 y.o. male  was evaluated in triage.  Pt complains of weakness and poor oral intake.  Patient has had multiple episodes of emesis in the last 2 weeks, worsened this week.  Unable to keep any food down, also having some intermittent abdominal pain.  Patient was supposed start dialysis 2 years ago but has abstained due to fear, history of decreasing renal performance.    Review of Systems  Positive: Weakness, emesis, decreased oral intake Negative: Chest pain, shortness of breath  Physical Exam  BP (!) 140/91 (BP Location: Left Arm)   Pulse 73   Temp 98.5 F (36.9 C) (Oral)   Resp 14   SpO2 100%  Gen:   Awake, no distress/patient appears frail Resp:  Normal effort  MSK:   Moves extremities without difficulty  Other:  Pale mucous membranes, patient appears dry  Medical Decision Making  Medically screening exam initiated at 4:11 PM.  Appropriate orders placed.  Antonio Keller was informed that the remainder of the evaluation will be completed by another provider, this initial triage assessment does not replace that evaluation, and the importance of remaining in the ED until their evaluation is complete.  Patient appears weak, will get labs.  Given patient's history of renal failure I suspect this is likely the cause, will work-up for alternative causes of emesis.  Will check for electrolyte derangement.   Sherrill Raring, PA-C 02/14/21 1613    Valarie Merino, MD 02/15/21 [phone]

## 2021-02-14 NOTE — ED Triage Notes (Signed)
Pt from home with ems c.o, n/v/abd pain every time he eats. Pt to be starting dialysis soon- has not started yet.   18G LAC  146/100 HR 78-120 100% room air CBG 90

## 2021-02-14 NOTE — Progress Notes (Signed)
Informed of patient in ER. P/w vomiting, abd pain. Known CKD5 patient, has been refusing to start HD. Was also informed of beats of vtach--possibly related to acidemia. Mag and phos pending. At this junction, needs to start dialysis during this admission based on review of labs and past records. Recommend checking ABG and if acidemic then recommend starting bicarb gtt. Please keep him NPO after midnight for tentative [facility] placement, will discuss permanent access with him as well. Full consult note to follow in AM.  Gean Quint, MD [facility]

## 2021-02-14 NOTE — ED Notes (Signed)
Critical care at bedside  

## 2021-02-14 NOTE — ED Notes (Signed)
RN at bedside, pt's HR increased from 140s to 180s, new ECG performed. MD Tegeler aware. Cardiology and Critical care paged.

## 2021-02-14 NOTE — ED Notes (Signed)
Verbal order for '150mg'$  amiodarone bolus from Conley Canal MD.

## 2021-02-14 NOTE — ED Notes (Signed)
Conley Canal MD, Cardiology, at bedside. Verbal order for '6mg'$  of adenosine, then '12mg'$  adenosine.

## 2021-02-14 NOTE — ED Notes (Signed)
Bedside ultra sound being performed now

## 2021-02-14 NOTE — H&P (Signed)
NAME:  Antonio Keller, MRN:  [id number], DOB:  1960/08/05, LOS: 0 ADMISSION DATE:  02/14/2021, CONSULTATION DATE:  9/16 REFERRING MD:  Tegeler MD, CHIEF COMPLAINT:  N&V, abdominal pain   History of Present Illness:   Antonio Keller ,is a 60 y.o. male, who presented to the [facility] ED with a chief complaint of nausea, vomiting, and abdominal pain.  They have a pertinent past medical history of CKD5, HTN. Per Nephrology was refusing to start HD.   Reportedly, the patient has been having multiple episode of emesis in the last two weeks which has worsened this week. He has been unable to keep food down and endorses intermittent abdominal pain. He became increasingly fatigued on 9/16 and EMS was called.  ED course was notable for ABG of 7.22/30/114/12.5, Trop 99, MG 2.0, K 4.1, HGB 8.5, lipase 216. Lactate 0.7, AG 21, BUN 180 While in the ED he developed afib with RVR and was started on an amio gtt. Neph and Cardiology were consulted by the ED.  PCCM was consulted for evaluation.   Pertinent  Medical History  CKD5, HTN  Significant Hospital Events: Including procedures, antibiotic start and stop dates in addition to other pertinent events   9/16 Admit. Afib RVR. Amio load  Interim History / Subjective:  See above  Endorses occasional sharp chest pain,  intermittent abdominal pain, denies active abdominal pain. Denies melena, BRBPR, hematochezia  Objective   Blood pressure 120/87, pulse (!) 103, temperature 98.5 F (36.9 C), temperature source Oral, resp. rate 10, SpO2 100 %.        Intake/Output Summary (Last 24 hours) at 02/14/2021 2353 Last data filed at 02/14/2021 2137 Gross per 24 hour  Intake 100 ml  Output --  Net 100 ml   There were no vitals filed for this visit.  Examination: General:  in bed, NAD, somewhat lethargic HEENT: MM pink/moist, icteric/anicteric, atraumatic Neuro: GCS 15, RASS 0, PERRL 20m CV: S1S2, afib, no m/r/g appreciated PULM:  clear in the upper lobes and  in the lower lobes, Trachea midline, chest expansion symmetric GI: soft, bsx4 active, non distended, non tender  Extremities: warm/dry, generalized pretibial edema, capillary refill less than 3 seconds  Skin: no rashes or lesions  CXR with no pneumo, effusion, or infiltrate  Resolved Hospital Problem list     Assessment & Plan:   Abdominal Pain Nausea and vomiting AGMA  AG 21. BG 89. Lactate 0.7. Lipase 216.  Suspect secondary to uremia. Bun 180. 7.22/30/114/12.5. LFT WNL -Check RUQ [place]-TG level -Keep NPO -Holding on fluid resuscitation -PPI -Start bicarb GTT -Follow up BMP. -Neph following. Dialysis per neph -Admit to ICU  Afib Prolonged QT New onset per family. ?secondary to stress/illness. QT 563 -Continue amiodarone gtt -Cards consulted by EDP -Goal K above 4, goal MG above -New onset. [facility] -Limit QT prolonging agents  HX CKD5 K 4.1, Bun 180, creat 27.29. Non-oliguric per patient -Nephrology consulted. Dialysis per neph.  Troponin elevation Trop 99>82. Unclear cause. Hx of kidney dx. 12 lead with afib/flutter, diffuse ST depression. Denies active chest pain -trend -continue tele  Hx HTN -Hold home antihypertensives  Pancytopenia Suspect secondary to chronic kidney disease. No signs of active bleeding. PLT 82, WBC 3.2, HBG 7.8. Similar to 11/2020 CBC. -follow up H/H -Follow up AM PLT levels -Monitor for signs of active bleeding  Best Practice (right click and "Reselect all SmartList Selections" daily)   Diet/type: NPO DVT prophylaxis: SCD GI prophylaxis: PPI  Lines: N/A Foley:  N/A Code Status:  full code Last date of multidisciplinary goals of care discussion [9/16 Full scope of care]  Labs   CBC: Recent Labs  Lab 02/14/21 1611 02/14/21 2102  WBC 3.2*  --   NEUTROABS 1.6*  --   HGB 7.8* 8.5*  HCT 23.6* 25.0*  MCV 80.8  --   PLT 82*  --     Basic Metabolic Panel: Recent Labs  Lab 02/14/21 1611 02/14/21 1954  02/14/21 2102  NA 135  --  135  K 4.2  --  4.1  CL 101  --   --   CO2 13*  --   --   GLUCOSE 89  --   --   BUN 180*  --   --   CREATININE 27.29*  --   --   CALCIUM 6.5*  --   --   MG  --  2.0  --    GFR: CrCl cannot be calculated (Unknown ideal weight.). Recent Labs  Lab 02/14/21 1611  WBC 3.2*  LATICACIDVEN 0.7    Liver Function Tests: Recent Labs  Lab 02/14/21 1611  AST 36  ALT 29  ALKPHOS 79  BILITOT 0.7  PROT 6.9  ALBUMIN 3.6   Recent Labs  Lab 02/14/21 1954  LIPASE 216*   No results for input(s): AMMONIA in the last 168 hours.  ABG    Component Value Date/Time   PHART 7.228 (L) 02/14/2021 2102   PCO2ART 30.0 (L) 02/14/2021 2102   PO2ART 114 (H) 02/14/2021 2102   HCO3 12.5 (L) 02/14/2021 2102   TCO2 13 (L) 02/14/2021 2102   ACIDBASEDEF 14.0 (H) 02/14/2021 2102   O2SAT 98.0 02/14/2021 2102     Coagulation Profile: Recent Labs  Lab 02/14/21 1954  INR 1.3*    Cardiac Enzymes: No results for input(s): CKTOTAL, CKMB, CKMBINDEX, TROPONINI in the last 168 hours.  HbA1C: No results found for: HGBA1C  CBG: No results for input(s): GLUCAP in the last 168 hours.  Review of Systems:   Positives in bold  Gen: fever, chills, weight change, fatigue, night sweats HEENT:  blurred vision, double vision, hearing loss, tinnitus, sinus congestion, rhinorrhea, sore throat, neck stiffness, dysphagia PULM:  shortness of breath, cough, sputum production, hemoptysis, wheezing CV: chest pain, edema, orthopnea, paroxysmal nocturnal dyspnea, palpitations GI:  abdominal pain, nausea, vomiting, diarrhea, hematochezia, melena, constipation, change in bowel habits GU: dysuria, hematuria, polyuria, oliguria, urethral discharge Endocrine: hot or cold intolerance, polyuria, polyphagia or appetite change Derm: rash, dry skin, scaling or peeling skin change Heme: easy bruising, bleeding, bleeding gums Neuro: headache, numbness, weakness, slurred speech, loss of memory or  consciousness   Past Medical History:  He,  has a past medical history of Hypertension and Renal disorder.   Surgical History:  History reviewed. No pertinent surgical history.   Social History:   reports that he has never smoked. He has never used smokeless tobacco. He reports that he does not currently use alcohol. He reports that he does not use drugs.   Family History:  His family history is not on file.   Allergies No Known Allergies   Home Medications  Prior to Admission medications   Medication Sig Start Date End Date Taking? Authorizing Provider  allopurinol (ZYLOPRIM) 100 MG tablet Take 100 mg by mouth daily as needed (gout). 12/01/20  Yes [provider]  carvedilol (COREG) 25 MG tablet Take 25 mg by mouth 2 (two) times daily. 04/26/18  Yes [provider]  chlorthalidone (HYGROTON) 25 MG tablet Take 25 mg by mouth daily.   Yes [provider]  colchicine 0.6 MG tablet Take 0.6 mg by mouth 2 (two) times daily as needed (gout). 01/27/21  Yes [provider]  hydrALAZINE (APRESOLINE) 100 MG tablet Take 100 mg by mouth 2 (two) times daily. 01/27/21  Yes [provider]  Vitamin D3 (VITAMIN D) 25 MCG tablet Take 1 tablet (1,000 Units total) by mouth daily. 12/20/20  Yes Aslam, Loralyn Freshwater, MD  hydrALAZINE (APRESOLINE) 50 MG tablet Take 1 tablet (50 mg total) by mouth 3 (three) times daily. Patient not taking: No sig reported 08/23/20   Donato Heinz, MD     Critical care time: 55 minutes    [employer] MSN, APRN, AGACNP-BC [facility]  02/14/2021 , 11:53 PM  Please see [email or web address] for pager details  If no response, please call [phone] After hours, please call Elink at [phone]

## 2021-02-14 NOTE — ED Provider Notes (Addendum)
[facility] EMERGENCY DEPARTMENT Provider Note   CSN: [id number] Arrival date & time: 02/14/21  1503     History Chief Complaint  Patient presents with   Abdominal Pain   Emesis    Antonio Keller is a 60 y.o. male with a past medical history of ESRD with CKD stage V, severely uncontrolled asymptomatic hypertension, gout, and anemia. Patient has been resistant to dialysis. Hx is primarily given by the patient's wife who is at bedside along with review of EMR.  She reports that he has been vomiting every day for the past 2 weeks and has had very poor oral intake.  He reported chest pain rating down the left arm 2 days ago.  Today he felt so bad that he asked for her to call EMS.  He has not had any confusion.   Abdominal Pain Associated symptoms: vomiting   Emesis Associated symptoms: abdominal pain       Past Medical History:  Diagnosis Date   Hypertension    Renal disorder     Patient Active Problem List   Diagnosis Date Noted   Severe uncontrolled hypertension 12/18/2020    History reviewed. No pertinent surgical history.     No family history on file.  Social History   Tobacco Use   Smoking status: Never   Smokeless tobacco: Never  Substance Use Topics   Alcohol use: Not Currently   Drug use: Never    Home Medications Prior to Admission medications   Medication Sig Start Date End Date Taking? Authorizing Provider  allopurinol (ZYLOPRIM) 100 MG tablet Take 100 mg by mouth daily. 12/01/20   [provider]  carvedilol (COREG) 25 MG tablet Take 25 mg by mouth 2 (two) times daily. 04/26/18   [provider]  chlorthalidone (HYGROTON) 25 MG tablet Take 25 mg by mouth daily.    [provider]  Vitamin D3 (VITAMIN D) 25 MCG tablet Take 1 tablet (1,000 Units total) by mouth daily. 12/20/20   Harvie Heck, MD  hydrALAZINE (APRESOLINE) 50 MG tablet Take 1 tablet (50 mg total) by mouth 3 (three) times daily. 08/23/20    Donato Heinz, MD    Allergies    Patient has no known allergies.  Review of Systems   Review of Systems  Gastrointestinal:  Positive for abdominal pain and vomiting.  Ten systems reviewed and are negative for acute change, except as noted in the HPI.   Physical Exam Updated Vital Signs BP 126/69   Pulse 72   Temp 98.5 F (36.9 C) (Oral)   Resp 20   SpO2 100%   Physical Exam Vitals and nursing note reviewed.  Constitutional:      General: He is not in acute distress.    Appearance: He is ill-appearing. He is not diaphoretic.  HENT:     Head: Normocephalic and atraumatic.  Eyes:     General: No scleral icterus.    Conjunctiva/sclera: Conjunctivae normal.  Cardiovascular:     Rate and Rhythm: Tachycardia present. Rhythm irregular.     Heart sounds: Normal heart sounds.  Pulmonary:     Effort: Pulmonary effort is normal. No respiratory distress.     Breath sounds: Normal breath sounds.  Abdominal:     Palpations: Abdomen is soft.     Tenderness: There is no abdominal tenderness.  Musculoskeletal:     Cervical back: Normal range of motion and neck supple.  Skin:    General: Skin is warm and dry.  Neurological:     Mental Status: He is alert.  Psychiatric:        Behavior: Behavior normal.    ED Results / Procedures / Treatments   Labs (all labs ordered are listed, but only abnormal results are displayed) Labs Reviewed  COMPREHENSIVE METABOLIC PANEL - Abnormal; Notable for the following components:      Result Value   CO2 13 (*)    BUN 180 (*)    Creatinine, Ser 27.29 (*)    Calcium 6.5 (*)    GFR, Estimated 2 (*)    Anion gap 21 (*)    All other components within normal limits  CBC WITH DIFFERENTIAL/PLATELET - Abnormal; Notable for the following components:   WBC 3.2 (*)    RBC 2.92 (*)    Hemoglobin 7.8 (*)    HCT 23.6 (*)    RDW 15.7 (*)    Platelets 82 (*)    Neutro Abs 1.6 (*)    All other components within normal limits  RESP PANEL BY  RT-PCR (FLU A&B, COVID) ARPGX2  LACTIC ACID, PLASMA    EKG None  Radiology DG Chest 2 View  Result Date: 02/14/2021 CLINICAL DATA:  Shortness of breath EXAM: CHEST - 2 VIEW COMPARISON:  Chest x-ray dated November 18, 2020 FINDINGS: Inferior cardiophrenic angles are excluded from the field-of-view on AP image. Unchanged cardiomegaly and tortuosity of the thoracic aorta. Lungs are clear. No evidence of pleural effusion or pneumothorax. IMPRESSION: No active cardiopulmonary disease. Electronically Signed   By: Yetta Glassman M.D.   On: 02/14/2021 17:03    Procedures .Critical Care Performed by: Margarita Mail, PA-C Authorized by: Margarita Mail, PA-C   Critical care provider statement:    Critical care time (minutes):  90   Critical care was time spent personally by me on the following activities:  Discussions with consultants, evaluation of patient's response to treatment, examination of patient, ordering and performing treatments and interventions, ordering and review of laboratory studies, ordering and review of radiographic studies, pulse oximetry, re-evaluation of patient's condition, obtaining history from patient or surrogate and review of old charts   Medications Ordered in ED Medications - No data to display  ED Course  I have reviewed the triage vital signs and the nursing notes.  Pertinent labs & imaging results that were available during my care of the patient were reviewed by me and considered in my medical decision making (see chart for details).  Clinical Course as of 02/15/21 1036  Fri Feb 14, 2021  2010 Case discussed with Dr. Gean Quint of Nephrology. EKG with runs of Vtach about 5 sec. He recommends ABG- If acidotic, begin Bicarb drip. NPO after midnight with plan for dialysis catheter placement and Dialysis tomorrow.   [AH]  2108 Patient appears to be having sustained episodes of tachycardia into the 180s.  He is normotensive however his wife is concerned about this  because she states he is always extremely hypertensive.  Patient's lipase is also returned elevated.  Do not feel that the patient is stable enough to undergo CT scan at this time. [AH]  2113 Troponin I (High Sensitivity)(!): 99 Patient troponin is elevated.  This may be due to episodes of sustained tachycardia as well as inability to clear troponin renally. [AH]  2148 Cardiology at bedside. Given adenosine x2 w.out improvement. No on lidocaine, amiodarone, and bicarb drip.  Case discussed with Dr. Duwayne Heck of critical care medicine who will consult on the patient. [AH]  Clinical Course User Index [AH] Margarita Mail, PA-C   MDM Rules/Calculators/A&P                         CHA2DS2-VASc score 1  Patient here with c/o abd pain, chest pain and vomiting.   The differential diagnosis for generalized abdominal pain includes, but is not limited to AAA, gastroenteritis, appendicitis, Bowel obstruction, Bowel perforation. Gastroparesis, DKA, Hernia, Inflammatory bowel disease, mesenteric ischemia, pancreatitis, peritonitis SBP, volvulus.   Reviewed labs that show severe derangement. CBC- normocytic anemia likely ACD due to known kidney failure CMP- BUN-180, CR-27.27, Hypocalcemiaat 6.5- anion gap acidosis- due to uremia PT/INR - elevated Troponin - elevated - likely due to demand ischemia/poor clearance ABG- uncompensated metabolic acidosis RESP panel Lipase -Elevated-may represent pancreatitis- no further assessment due to patient instablilty  20:00 Cardiac monitoring reveals Rapid, irregular narrow complex rate, as reviewed and interpreted by me.(SVT, Afib w abberrancy, less likely V-tach?) Cardiac monitoring was ordered due to critical illness, instability, tachycardia, and to monitor patient for dysrhythmia.   Patient EKG- (multiple) initial showed narrow complex tachy with rate in the 160s  Consults  with Dr. Candiss Norse of nephrology, Dr. Conley Canal at bedside with cardiology, and with Dr.  Duwayne Heck of PCCM. (Discussed in Course above)   Interventions: Patient given atropine x 2 without improvement in rate. Placed on Bicarb, Amiodarone and Lidocaine drip with improvement in rate. Patient has Prolonged QT on EKG and was given IM tigan for nausea to avoid QT prolonging antiemetics with excellent improvement. Patient given pain medication for abdominal pain.  Patient is critically ill throughout his ED course in need of urgent/ emergent interventions, multiple consults, multiple reevaluations. Final Clinical Impression(s) / ED Diagnoses Final diagnoses:  None    Rx / DC Orders ED Discharge Orders     None        Margarita Mail, PA-C 02/15/21 1033    Margarita Mail, PA-C 02/15/21 1037    Tegeler, Gwenyth Allegra, MD 02/17/21 1651

## 2021-02-14 NOTE — ED Notes (Signed)
MD at bedside. 

## 2021-02-15 ENCOUNTER — Inpatient Hospital Stay (HOSPITAL_COMMUNITY): Payer: BC Managed Care – PPO

## 2021-02-15 DIAGNOSIS — I480 Paroxysmal atrial fibrillation: Secondary | ICD-10-CM | POA: Diagnosis not present

## 2021-02-15 DIAGNOSIS — E872 Acidosis: Secondary | ICD-10-CM

## 2021-02-15 DIAGNOSIS — N186 End stage renal disease: Secondary | ICD-10-CM | POA: Diagnosis not present

## 2021-02-15 DIAGNOSIS — I4891 Unspecified atrial fibrillation: Secondary | ICD-10-CM

## 2021-02-15 HISTORY — PX: IR US GUIDE VASC ACCESS RIGHT: IMG2390

## 2021-02-15 HISTORY — PX: IR FLUORO GUIDE CV LINE RIGHT: IMG2283

## 2021-02-15 LAB — BASIC METABOLIC PANEL
Anion gap: 20 — ABNORMAL HIGH (ref 5–15)
BUN: 177 mg/dL — ABNORMAL HIGH (ref 6–20)
CO2: 16 mmol/L — ABNORMAL LOW (ref 22–32)
Calcium: 6.5 mg/dL — ABNORMAL LOW (ref 8.9–10.3)
Chloride: 99 mmol/L (ref 98–111)
Creatinine, Ser: 27.5 mg/dL — ABNORMAL HIGH (ref 0.61–1.24)
Glucose, Bld: 129 mg/dL — ABNORMAL HIGH (ref 70–99)
Potassium: 3.5 mmol/L (ref 3.5–5.1)
Sodium: 135 mmol/L (ref 135–145)

## 2021-02-15 LAB — CBC
HCT: 21.4 % — ABNORMAL LOW (ref 39.0–52.0)
Hemoglobin: 7.2 g/dL — ABNORMAL LOW (ref 13.0–17.0)
MCH: 26.8 pg (ref 26.0–34.0)
MCHC: 33.6 g/dL (ref 30.0–36.0)
MCV: 79.6 fL — ABNORMAL LOW (ref 80.0–100.0)
Platelets: 72 10*3/uL — ABNORMAL LOW (ref 150–400)
RBC: 2.69 MIL/uL — ABNORMAL LOW (ref 4.22–5.81)
RDW: 15.6 % — ABNORMAL HIGH (ref 11.5–15.5)
WBC: 3.3 10*3/uL — ABNORMAL LOW (ref 4.0–10.5)
nRBC: 0 % (ref 0.0–0.2)

## 2021-02-15 LAB — ECHOCARDIOGRAM COMPLETE
AR max vel: 2.39 cm2
AV Area VTI: 2.31 cm2
AV Area mean vel: 2.21 cm2
AV Mean grad: 6 mmHg
AV Peak grad: 9 mmHg
Ao pk vel: 1.5 m/s
Area-P 1/2: 2.38 cm2
Calc EF: 45.6 %
MV M vel: 5.12 m/s
MV Peak grad: 104.9 mmHg
MV VTI: 2.19 cm2
S' Lateral: 4.3 cm
Single Plane A2C EF: 46.8 %
Single Plane A4C EF: 43.7 %
Weight: 2828.94 oz

## 2021-02-15 LAB — GLUCOSE, CAPILLARY
Glucose-Capillary: 122 mg/dL — ABNORMAL HIGH (ref 70–99)
Glucose-Capillary: 124 mg/dL — ABNORMAL HIGH (ref 70–99)
Glucose-Capillary: 126 mg/dL — ABNORMAL HIGH (ref 70–99)
Glucose-Capillary: 140 mg/dL — ABNORMAL HIGH (ref 70–99)

## 2021-02-15 LAB — MRSA NEXT GEN BY PCR, NASAL: MRSA by PCR Next Gen: NOT DETECTED

## 2021-02-15 LAB — PHOSPHORUS: Phosphorus: 10.4 mg/dL — ABNORMAL HIGH (ref 2.5–4.6)

## 2021-02-15 LAB — MAGNESIUM: Magnesium: 2 mg/dL (ref 1.7–2.4)

## 2021-02-15 LAB — LIPASE, BLOOD: Lipase: 378 U/L — ABNORMAL HIGH (ref 11–51)

## 2021-02-15 LAB — TRIGLYCERIDES: Triglycerides: 130 mg/dL (ref <150)

## 2021-02-15 MED ORDER — CHLORHEXIDINE GLUCONATE CLOTH 2 % EX PADS: 6.0000 | MEDICATED_PAD | Freq: Every day | CUTANEOUS | Status: DC

## 2021-02-15 MED ORDER — HEPARIN SODIUM (PORCINE) 1000 UNIT/ML IJ SOLN
INTRAMUSCULAR | Status: AC
  Filled 2021-02-15: qty 1

## 2021-02-15 MED ORDER — LIDOCAINE HCL 1 % IJ SOLN
INTRAMUSCULAR | Status: DC | PRN
  Administered 2021-02-15: 10 mL

## 2021-02-15 MED ORDER — MIDAZOLAM HCL 2 MG/2ML IJ SOLN
INTRAMUSCULAR | Status: AC
  Filled 2021-02-15: qty 2

## 2021-02-15 MED ORDER — FENTANYL CITRATE (PF) 100 MCG/2ML IJ SOLN
INTRAMUSCULAR | Status: AC
  Filled 2021-02-15: qty 2

## 2021-02-15 MED ORDER — CHLORHEXIDINE GLUCONATE CLOTH 2 % EX PADS
6.0000 | MEDICATED_PAD | Freq: Every day | CUTANEOUS | Status: DC
Start: 2021-02-15 — End: 2021-02-18

## 2021-02-15 MED ORDER — LIDOCAINE-EPINEPHRINE 1 %-1:100000 IJ SOLN
INTRAMUSCULAR | Status: AC
  Filled 2021-02-15: qty 1

## 2021-02-15 MED ORDER — MIDAZOLAM HCL 2 MG/2ML IJ SOLN
INTRAMUSCULAR | Status: DC | PRN
  Administered 2021-02-15: 1 mg via INTRAVENOUS

## 2021-02-15 MED ORDER — FENTANYL CITRATE (PF) 100 MCG/2ML IJ SOLN
INTRAMUSCULAR | Status: DC | PRN
  Administered 2021-02-15: 50 ug via INTRAVENOUS

## 2021-02-15 MED ORDER — HEPARIN SODIUM (PORCINE) 1000 UNIT/ML IJ SOLN
INTRAMUSCULAR | Status: DC | PRN
  Administered 2021-02-15: 3800 [IU] via INTRAVENOUS

## 2021-02-15 NOTE — Progress Notes (Signed)
eLink Physician-Brief Progress Note Patient Name: Antonio Keller DOB: 05/31/1961 MRN: [id number]   Date of Service  02/15/2021  HPI/Events of Note  Admitted with AKI on CKD and rapid afib; on an amio gtt at 0.'5mg'$ /min  eICU Interventions  Resting; stable; on amio gtt; no new orders     Intervention Category Evaluation Type: New Patient Evaluation  Tilden Dome 02/15/2021, 3:00 AM

## 2021-02-15 NOTE — Consult Note (Addendum)
Chief Complaint: Patient was seen in consultation today for tunneled HD catheter placement  Referring Physician(s): Gean Quint, MD  Supervising Physician: Markus Daft  Patient Status: [facility] - In-pt  History of Present Illness: Antonio Keller is a 60 y.o. male with a past medical history significant for HTN, gout and ESRD (previously refusing HD) who presented to [facility] ED on 02/14/21 with complaints of abdominal pain, nausea and vomiting x 2 weeks. He was found to be significantly uremic with creatinine of 27.29 and BUN of 180. He was also noted to have new onset a.fib with RVR and prolonged QT for which he was started on amiodarone. He was admitted to the ICU and IR has been asked to place a tunneled HD catheter to begin HD.  Antonio Keller seen in the ICU, he is quiet and does not provide much information but he does tell me that he's never had dialysis before and is not having any pain, n/v right now. He is agreeable to tunneled HD catheter placement.  Past Medical History:  Diagnosis Date   Hypertension    Renal disorder     History reviewed. No pertinent surgical history.  Allergies: Patient has no known allergies.  Medications: Prior to Admission medications   Medication Sig Start Date End Date Taking? Authorizing Provider  allopurinol (ZYLOPRIM) 100 MG tablet Take 100 mg by mouth daily as needed (gout). 12/01/20  Yes [provider]  carvedilol (COREG) 25 MG tablet Take 25 mg by mouth 2 (two) times daily. 04/26/18  Yes [provider]  chlorthalidone (HYGROTON) 25 MG tablet Take 25 mg by mouth daily.   Yes [provider]  colchicine 0.6 MG tablet Take 0.6 mg by mouth 2 (two) times daily as needed (gout). 01/27/21  Yes [provider]  hydrALAZINE (APRESOLINE) 100 MG tablet Take 100 mg by mouth 2 (two) times daily. 01/27/21  Yes [provider]  Vitamin D3 (VITAMIN D) 25 MCG tablet Take 1 tablet (1,000 Units total) by mouth daily. 12/20/20   Yes Aslam, Loralyn Freshwater, MD  hydrALAZINE (APRESOLINE) 50 MG tablet Take 1 tablet (50 mg total) by mouth 3 (three) times daily. Patient not taking: No sig reported 08/23/20   Donato Heinz, MD     No family history on file.  Social History   Socioeconomic History   Marital status: Married    Spouse name: Not on file   Number of children: Not on file   Years of education: Not on file   Highest education level: Not on file  Occupational History   Not on file  Tobacco Use   Smoking status: Never   Smokeless tobacco: Never  Substance and Sexual Activity   Alcohol use: Not Currently   Drug use: Never   Sexual activity: Yes    Partners: Female  Other Topics Concern   Not on file  Social History Narrative   Not on file   Social Determinants of Health   Financial Resource Strain: Not on file  Food Insecurity: Not on file  Transportation Needs: Not on file  Physical Activity: Not on file  Stress: Not on file  Social Connections: Not on file     Review of Systems: A 12 point ROS discussed and pertinent positives are indicated in the HPI above.  All other systems are negative.  Review of Systems  Respiratory:  Negative for shortness of breath.   Cardiovascular:  Negative for chest pain.  Gastrointestinal:  Negative for abdominal pain, nausea and  vomiting.  Neurological:  Negative for headaches.  All other systems reviewed and are negative.  Vital Signs: BP 121/83   Pulse 65   Temp 97.6 F (36.4 C) (Oral)   Resp (!) 26   Wt 176 lb 12.9 oz (80.2 kg)   SpO2 98%   BMI 23.98 kg/m   Physical Exam Vitals and nursing note reviewed.  Constitutional:      General: He is not in acute distress. HENT:     Head: Normocephalic.     Mouth/Throat:     Mouth: Mucous membranes are moist.     Pharynx: Oropharynx is clear. No oropharyngeal exudate or posterior oropharyngeal erythema.  Cardiovascular:     Rate and Rhythm: Normal rate and regular rhythm.  Pulmonary:      Effort: Pulmonary effort is normal.     Breath sounds: Normal breath sounds.  Abdominal:     Palpations: Abdomen is soft.  Skin:    General: Skin is warm and dry.  Neurological:     Mental Status: He is alert and oriented to person, place, and time.  Psychiatric:        Mood and Affect: Mood normal.        Behavior: Behavior normal.        Thought Content: Thought content normal.        Judgment: Judgment normal.     MD Evaluation Airway: WNL Heart: WNL Abdomen: WNL Chest/ Lungs: WNL ASA  Classification: 3 Mallampati/Airway Score: One   Imaging: DG Chest 2 View  Result Date: 02/14/2021 CLINICAL DATA:  Shortness of breath EXAM: CHEST - 2 VIEW COMPARISON:  Chest x-ray dated November 18, 2020 FINDINGS: Inferior cardiophrenic angles are excluded from the field-of-view on AP image. Unchanged cardiomegaly and tortuosity of the thoracic aorta. Lungs are clear. No evidence of pleural effusion or pneumothorax. IMPRESSION: No active cardiopulmonary disease. Electronically Signed   By: Yetta Glassman M.D.   On: 02/14/2021 17:03   DG Chest Port 1 View  Result Date: 02/14/2021 CLINICAL DATA:  Chest pain EXAM: PORTABLE CHEST 1 VIEW COMPARISON:  02/14/2021 FINDINGS: Mild cardiomegaly. No focal opacity or pleural effusion. Aortic atherosclerosis. No pneumothorax IMPRESSION: No active disease.  Mild cardiomegaly Electronically Signed   By: Donavan Foil M.D.   On: 02/14/2021 23:19   US Abdomen Limited RUQ (LIVER/GB)  Result Date: 02/14/2021 CLINICAL DATA:  Epigastric pain EXAM: ULTRASOUND ABDOMEN LIMITED RIGHT UPPER QUADRANT COMPARISON:  Ultrasound 12/18/2020 FINDINGS: Gallbladder: No shadowing stones. Possible mild focal wall thickening up to 3.5 mm. Negative sonographic Murphy. Trace pericholecystic fluid Common bile duct: Diameter: 4.5 mm Liver: No focal lesion identified. Within normal limits in parenchymal echogenicity. Portal vein is patent on color Doppler imaging with normal direction of  blood flow towards the liver. Other: Echogenic right kidney consistent with medical renal disease. IMPRESSION: 1. Negative for gallstones. There may be mild focal wall thickening but there is a negative sonographic Murphy. Trace fluid adjacent to gallbladder. Findings not strongly suggestive of acute gallbladder disease; if continued concern, nuclear medicine hepatobiliary imaging may be obtained. 2. Echogenic right kidney consistent with medical renal disease Electronically Signed   By: Donavan Foil M.D.   On: 02/14/2021 23:57    Labs:  CBC: Recent Labs    12/18/20 2130 12/19/20 0152 12/19/20 1519 02/14/21 1611 02/14/21 2102 02/15/21 0618  WBC 4.5 4.8  --  3.2*  --  3.3*  HGB 7.1* 7.0* 7.9* 7.8* 8.5* 7.2*  HCT 21.2* 20.9* 23.6* 23.6* 25.0* 21.4*  PLT 77* 81*  --  82*  --  72*    COAGS: Recent Labs    12/18/20 1521 02/14/21 1954  INR 1.3* 1.3*  APTT 41*  --     BMP: Recent Labs    12/18/20 0852 12/19/20 0152 02/14/21 1611 02/14/21 2102 02/15/21 0618  NA 139 140 135 135 135  K 4.2 3.9 4.2 4.1 3.5  CL 107 107 101  --  99  CO2 17* 19* 13*  --  16*  GLUCOSE 91 154* 89  --  129*  BUN 142* 143* 180*  --  177*  CALCIUM 7.3* 7.2* 6.5*  --  6.5*  CREATININE 16.49* 16.78* 27.29*  --  PENDING  GFRNONAA 3* 3* 2*  --  NOT CALCULATED    LIVER FUNCTION TESTS: Recent Labs    12/18/20 0852 12/19/20 0152 02/14/21 1611  BILITOT 0.7 0.3 0.7  AST 13* 13* 36  ALT '17 19 29  '$ ALKPHOS 88 78 79  PROT 6.9 6.6 6.9  ALBUMIN 3.6 3.4* 3.6    TUMOR MARKERS: No results for input(s): AFPTM, CEA, CA199, CHROMGRNA in the last 8760 hours.  Assessment and Plan:  60 y/o M with history of ESRD not on HD admitted with uremic symptoms, new onset a.fib with RVR and worsening renal function now planned to begin HD seen today for tunneled HD catheter placement in IR. Plan to proceed with tunneled HD catheter placement today - patient agreeable.  Risks and benefits discussed with the patient  including, but not limited to bleeding, infection, vascular injury, pneumothorax which may require chest tube placement, air embolism or even death.  All of the patient's questions were answered, patient is agreeable to proceed.  Consent signed and in IR control room.  Thank you for this interesting consult.  I greatly enjoyed meeting Carloseduardo Haggart and look forward to participating in their care.  A copy of this report was sent to the requesting provider on this date.  Electronically Signed: Joaquim Nam, PA-C 02/15/2021, 8:56 AM   I spent a total of 20 Minutes in face to face in clinical consultation, greater than 50% of which was counseling/coordinating care for tunneled HD catheter placement.

## 2021-02-15 NOTE — Consult Note (Addendum)
Nephrology Consult   Assessment/Recommendations:   CKD5, now ESRD (worsening): 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 We're now at the junction where he needs to start renal replacement therapy. Discussed this detail with the patient. He is now accepting of this as well -NPO currently, IR consult ordered for tunneled line placement, appreciate assistance. Plan for HD start today (will need strict slow start protocol). Next HD planned for either Sun or Mon depending on census/staffing -Will ned to consult VVS for permanent access -Continue to monitor daily Cr, Dose meds for GFR<15 -Monitor Daily I/Os, Daily weight  -Maintain MAP>65 for optimal renal perfusion.  -Agree with holding ACE-I, avoid further nephrotoxins including NSAIDS, Morphine.  Unless absolutely necessary, avoid CT with contrast and/or MRI with gadolinium.     Vomiting, nausea -likely uremic symptoms, starting HD as above  Hypocalcemia -replete PRN, 3Ca bath with HD  Afib w/ RVR -cardio on board, currently on amio  Hypertension: -home meds on hold, BP acceptable  Metabolic acidosis, metabolic acidemia -2/2 to severe CKD -agree with hco3 gtt, can stop this once on HD  Anemia due to chronic kidney disease: -Transfuse for Hgb<7 g/dL -iron panel ordered  Recommendations conveyed to primary service.   [facility] 02/15/2021 6:36 AM   _____________________________________________________________________________________   History of Present Illness: Antonio Keller is a/an 60 y.o. male with a past medical history of CKD5, HTN, IDA, gout, tertiary hyperparathyroidism who presents to [facility] with abd pain, n/v for 3 weeks. Renal function markedly worse, BUN 180 on presentation. Concerns for recurrent runs of nsvt in ER. Went into afib w/ RVR, controlled with amio. Acidemic as well on presentation,  started on HCO3 gtt. Patient follows with Dr. Moshe Cipro, previous followed by Dr. Neta Ehlers years ago. Over the years, patient has been refusing access and starting dialysis given the fact that he felt asymptomatic and had a loved one who had a traumatic experience with dialysis in the past. He reports that his n/v/abd pain is better. No other complaints today.   Medications:  Current Facility-Administered Medications  Medication Dose Route Frequency Provider Last Rate Last Admin   amiodarone (NEXTERONE PREMIX) 360-4.14 MG/200ML-% (1.8 mg/mL) IV infusion  30 mg/hr Intravenous Continuous Hershal Coria, MD 16.67 mL/hr at 02/15/21 0532 30 mg/hr at 02/15/21 0532   amiodarone (NEXTERONE) IV bolus only 150 mg/100 mL  150 mg Intravenous Once Hershal Coria, MD   Stopped at 02/14/21 2137   Chlorhexidine Gluconate Cloth 2 % PADS 6 each  6 each Topical Daily Collier Bullock, MD       docusate sodium (COLACE) capsule 100 mg  100 mg Oral BID PRN Estill Cotta, NP       pantoprazole (PROTONIX) injection 40 mg  40 mg Intravenous QHS Estill Cotta, NP   40 mg at 02/15/21 0146   polyethylene glycol (MIRALAX / GLYCOLAX) packet 17 g  17 g Oral Daily PRN Estill Cotta, NP       sodium bicarbonate 150 mEq in dextrose 5 % 1,150 mL infusion   Intravenous Continuous Tegeler, Gwenyth Allegra, MD 100 mL/hr at 02/15/21 0534 New Bag at 02/15/21 0534     ALLERGIES Patient has no known allergies.  MEDICAL HISTORY Past Medical History:  Diagnosis Date   Hypertension    Renal disorder      SOCIAL HISTORY Social History   Socioeconomic History   Marital status: Married  Spouse name: Not on file   Number of children: Not on file   Years of education: Not on file   Highest education level: Not on file  Occupational History   Not on file  Tobacco Use   Smoking status: Never   Smokeless tobacco: Never  Substance and Sexual Activity   Alcohol use: Not Currently   Drug use: Never   Sexual  activity: Yes    Partners: Female  Other Topics Concern   Not on file  Social History Narrative   Not on file   Social Determinants of Health   Financial Resource Strain: Not on file  Food Insecurity: Not on file  Transportation Needs: Not on file  Physical Activity: Not on file  Stress: Not on file  Social Connections: Not on file  Intimate Partner Violence: Not on file     FAMILY HISTORY No family history on file.   Review of Systems: 12 systems reviewed Otherwise as per HPI, all other systems reviewed and negative  Physical Exam: Vitals:   02/15/21 0200 02/15/21 0313  BP: 124/71   Pulse: 69   Resp: 12   Temp:  98.6 F (37 C)  SpO2: 99%    Total I/O In: 821.9 [I.V.:821.9] Out: -   Intake/Output Summary (Last 24 hours) at 02/15/2021 0636 Last data filed at 02/15/2021 0300 Gross per 24 hour  Intake 821.88 ml  Output --  Net 821.88 ml   General: well-appearing, no acute distress HEENT: anicteric sclera, oropharynx clear without lesions CV: regular rate, normal rhythm, no murmurs, no gallops, no rubs, no peripheral edema Lungs: clear to auscultation bilaterally, normal work of breathing Abd: soft, non-tender, non-distended Skin: no visible lesions or rashes Psych: alert, engaged, appropriate mood and affect Musculoskeletal: no obvious deformities Neuro: normal speech, no gross focal deficits   Test Results Reviewed Lab Results  Component Value Date   NA 135 02/14/2021   K 4.1 02/14/2021   CL 101 02/14/2021   CO2 13 (L) 02/14/2021   BUN 180 (H) 02/14/2021   CREATININE 27.29 (H) 02/14/2021   CALCIUM 6.5 (L) 02/14/2021   ALBUMIN 3.6 02/14/2021   PHOS 8.3 (H) 12/18/2020     I have reviewed all relevant outside healthcare records related to the patient's kidney injury.

## 2021-02-15 NOTE — Procedures (Signed)
Vascular and Interventional Radiology Procedure Note  Patient: Antonio Keller DOB: September 09, 1960 Medical Record Number: [id number] Note Date/Time: 02/15/21 1:43 PM   Performing Physician: Michaelle Birks, MD Assistant(s): None  Diagnosis: ESRD requiring Hemodialysis  Procedure: TUNNELED HEMODIALYSIS CATHETER PLACEMENT  Anesthesia: Conscious Sedation Complications: None Estimated Blood Loss: Minimal Specimens:  None  Findings:  Successful placement of right-sided, 23 cm (tip-to-cuff), tunneled hemodialysis catheter with the tip of the catheter in the proximal right atrium.  Plan: Catheter ready for use.  See detailed procedure note with images in PACS. The patient tolerated the procedure well without incident or complication and was returned to Floor Bed in stable condition.    Michaelle Birks, MD [facility] Radiology   Pager. [place]

## 2021-02-15 NOTE — Consult Note (Signed)
Cardiology Consultation:   Patient ID: [name] MRN: [id number]; DOB: 16-Jun-1960  Admit date: 02/14/2021 Date of Consult: 02/15/2021  PCP:  Jolinda Croak, MD   [facility] Providers Cardiologist:  None        Patient Profile:   Antonio Keller is a 60 y.o. male with a hx of ESRD not on IHD, HTN, and gout who is being seen 02/15/2021 for the evaluation of SVT versus V. tach at the request of the ED.  History of Present Illness:   Antonio Keller reports that over the past few days he has had worsening periumbilical abdominal pain that radiates up to his chest.  He says that the abdominal pain is sharp in quality and shoots up to the left side of his chest and left arm.  Despite this he denies having primary chest pain, but rather pain that is radiating up to his chest.  His abdominal pain has been associated with frequent nausea and vomiting and generalized malaise.  He denies chest pain, shortness of breath, fever, chills, focal weakness or numbness, PND, swelling, orthopnea.  He does endorse palpitations.  Of note the patient was recently hospitalized in 7/22 for hypertensive urgency.  He has poor renal function with ESRD not on IHD yet.  During his hospitalization he was also noted to be tachycardic which was felt to be SVT versus A. fib with RVR.  On arrival to the ED the patients VS were afebrile, HR 140s - 170s, BP 140/91, RR 14, and satting 100% on RA.  Labs notable for WBC 3.2, hemoglobin 7.8, platelets 82, BUN 180, creatinine 27, calcium 6.5, lactate 0.7, lipase 216, troponin 99 -> 82.  CXR notable for mild cardiomegaly.  My bedside POCUS notable for preserved EF with LVH.  ECG showed SVT with aberrant conduction versus V. tach.  A post adenosine 6 mg x1 and 12 mg x1, which did not appreciably slow his heart rate.  Due to concern for possible V. tach, I initiated amiodarone bolus and infusion which better controlled his heart rates.  I was further consulted for ongoing management of  his tachycardia.   Past Medical History:  Diagnosis Date   Hypertension    Renal disorder     History reviewed. No pertinent surgical history.   Home Medications:  Prior to Admission medications   Medication Sig Start Date End Date Taking? Authorizing Provider  allopurinol (ZYLOPRIM) 100 MG tablet Take 100 mg by mouth daily as needed (gout). 12/01/20  Yes [provider]  carvedilol (COREG) 25 MG tablet Take 25 mg by mouth 2 (two) times daily. 04/26/18  Yes [provider]  chlorthalidone (HYGROTON) 25 MG tablet Take 25 mg by mouth daily.   Yes [provider]  colchicine 0.6 MG tablet Take 0.6 mg by mouth 2 (two) times daily as needed (gout). 01/27/21  Yes [provider]  hydrALAZINE (APRESOLINE) 100 MG tablet Take 100 mg by mouth 2 (two) times daily. 01/27/21  Yes [provider]  Vitamin D3 (VITAMIN D) 25 MCG tablet Take 1 tablet (1,000 Units total) by mouth daily. 12/20/20  Yes Aslam, Antonio [place], MD  hydrALAZINE (APRESOLINE) 50 MG tablet Take 1 tablet (50 mg total) by mouth 3 (three) times daily. Patient not taking: No sig reported 08/23/20   Donato Heinz, MD    Inpatient Medications: Scheduled Meds:  pantoprazole (PROTONIX) IV  40 mg Intravenous QHS   Continuous Infusions:  amiodarone 30 mg/hr (02/15/21 0300)   amiodarone Stopped (02/14/21 2137)  sodium bicarbonate 150 mEq in D5W infusion 100 mL/hr at 02/15/21 0300   PRN Meds: docusate sodium, polyethylene glycol  Allergies:   No Known Allergies  Social History:   Social History   Socioeconomic History   Marital status: Married    Spouse name: Not on file   Number of children: Not on file   Years of education: Not on file   Highest education level: Not on file  Occupational History   Not on file  Tobacco Use   Smoking status: Never   Smokeless tobacco: Never  Substance and Sexual Activity   Alcohol use: Not Currently   Drug use: Never   Sexual activity: Yes     Partners: Female  Other Topics Concern   Not on file  Social History Narrative   Not on file   Social Determinants of Health   Financial Resource Strain: Not on file  Food Insecurity: Not on file  Transportation Needs: Not on file  Physical Activity: Not on file  Stress: Not on file  Social Connections: Not on file  Intimate Partner Violence: Not on file    Family History:   No family history on file.   ROS:  Please see the history of present illness.  All other ROS reviewed and negative.     Physical Exam/Data:   Vitals:   02/15/21 0041 02/15/21 0100 02/15/21 0200 02/15/21 0313  BP:  129/86 124/71   Pulse:  64 69   Resp:  10 12   Temp: 98.5 F (36.9 C)   98.6 F (37 C)  TempSrc: Oral   Oral  SpO2:  95% 99%     Intake/Output Summary (Last 24 hours) at 02/15/2021 0437 Last data filed at 02/15/2021 0300 Gross per 24 hour  Intake 821.88 ml  Output --  Net 821.88 ml   Last 3 Weights 12/19/2020 12/18/2020 08/23/2020  Weight (lbs) 177 lb 11.1 oz 177 lb 7.5 oz 189 lb 3.2 oz  Weight (kg) 80.6 kg 80.5 kg 85.821 kg     There is no height or weight on file to calculate BMI.  General:  Well nourished, well developed, in no acute distress HEENT: normal, EOMI, PERRLA Neck: no JVD Vascular: 2+ radial pulses bilaterally Cardiac: Tachycardic with irregularly irregular rhythm, normal S1 and S2, I/VI systolic murmur at LUSB, no rubs or gallops Lungs:  clear to auscultation bilaterally, no wheezing, rhonchi or rales  Abd: soft and nondistended, mild tenderness to palpation near umbilicus with reducible hernia Ext: no edema Musculoskeletal:  No deformities, BUE and BLE strength normal and equal Skin: warm and dry  Neuro:  CNs 2-12 intact, no focal abnormalities noted Psych: Somewhat flattened affect  EKG:  The EKG was personally reviewed and demonstrates:  Afib with RVR    Telemetry:  Telemetry was personally reviewed and demonstrates:  SVT with aberancy vs Afib w/  RVR  Relevant CV Studies: N/A  Laboratory Data:  High Sensitivity Troponin:   Recent Labs  Lab 02/14/21 1954 02/14/21 2216  TROPONINIHS 99* 82*     Chemistry Recent Labs  Lab 02/14/21 1611 02/14/21 1954 02/14/21 2102  NA 135  --  135  K 4.2  --  4.1  CL 101  --   --   CO2 13*  --   --   GLUCOSE 89  --   --   BUN 180*  --   --   CREATININE 27.29*  --   --   CALCIUM 6.5*  --   --  MG  --  2.0  --   GFRNONAA 2*  --   --   ANIONGAP 21*  --   --     Recent Labs  Lab 02/14/21 1611  PROT 6.9  ALBUMIN 3.6  AST 36  ALT 29  ALKPHOS 79  BILITOT 0.7   Lipids No results for input(s): CHOL, TRIG, HDL, LABVLDL, LDLCALC, CHOLHDL in the last 168 hours.  Hematology Recent Labs  Lab 02/14/21 1611 02/14/21 2102  WBC 3.2*  --   RBC 2.92*  --   HGB 7.8* 8.5*  HCT 23.6* 25.0*  MCV 80.8  --   MCH 26.7  --   MCHC 33.1  --   RDW 15.7*  --   PLT 82*  --    Thyroid No results for input(s): TSH, FREET4 in the last 168 hours.  BNPNo results for input(s): BNP, PROBNP in the last 168 hours.  DDimer No results for input(s): DDIMER in the last 168 hours.   Radiology/Studies:  DG Chest 2 View  Result Date: 02/14/2021 CLINICAL DATA:  Shortness of breath EXAM: CHEST - 2 VIEW COMPARISON:  Chest x-ray dated November 18, 2020 FINDINGS: Inferior cardiophrenic angles are excluded from the field-of-view on AP image. Unchanged cardiomegaly and tortuosity of the thoracic aorta. Lungs are clear. No evidence of pleural effusion or pneumothorax. IMPRESSION: No active cardiopulmonary disease. Electronically Signed   By: Yetta Glassman M.D.   On: 02/14/2021 17:03   DG Chest Port 1 View  Result Date: 02/14/2021 CLINICAL DATA:  Chest pain EXAM: PORTABLE CHEST 1 VIEW COMPARISON:  02/14/2021 FINDINGS: Mild cardiomegaly. No focal opacity or pleural effusion. Aortic atherosclerosis. No pneumothorax IMPRESSION: No active disease.  Mild cardiomegaly Electronically Signed   By: Donavan Foil M.D.   On:  02/14/2021 23:19   US Abdomen Limited RUQ (LIVER/GB)  Result Date: 02/14/2021 CLINICAL DATA:  Epigastric pain EXAM: ULTRASOUND ABDOMEN LIMITED RIGHT UPPER QUADRANT COMPARISON:  Ultrasound 12/18/2020 FINDINGS: Gallbladder: No shadowing stones. Possible mild focal wall thickening up to 3.5 mm. Negative sonographic Murphy. Trace pericholecystic fluid Common bile duct: Diameter: 4.5 mm Liver: No focal lesion identified. Within normal limits in parenchymal echogenicity. Portal vein is patent on color Doppler imaging with normal direction of blood flow towards the liver. Other: Echogenic right kidney consistent with medical renal disease. IMPRESSION: 1. Negative for gallstones. There may be mild focal wall thickening but there is a negative sonographic Murphy. Trace fluid adjacent to gallbladder. Findings not strongly suggestive of acute gallbladder disease; if continued concern, nuclear medicine hepatobiliary imaging may be obtained. 2. Echogenic right kidney consistent with medical renal disease Electronically Signed   By: Donavan Foil M.D.   On: 02/14/2021 23:57     Assessment and Plan:   Zyere Zuk is a 60 y.o. male with a hx of ESRD not on IHD, HTN, and gout who is being seen 02/15/2021 for the evaluation of SVT versus V. tach at the request of the ED.  #Atrial Fibrillation with RVR and Abberant Conduction #Prolonged QTc :: Patient with significant tachycardia to the 170s in the setting of GI symptoms and worsening renal function.  Admittedly, it was challenging to differentiate SVT versus V. tach in this patient initially.  I pushed adenosine x2 without any appreciable slowing in his heart rate which had me concerned that this could be V. tach.  However, there were clear instances when his heart rate was slowed down and you will see other underlying atrial fibrillation or sometimes sinus beats.  It is a bit confusing.  Overall however, after reviewing all the EKGs and telemetry in more detail, I do  suspect that this patient was simply having atrial fibrillation with RVR and abbarently conducted on occasion.  He seems to be responding well to the amiodarone, so we will continue this for now.  I am a bit concerned about his prolonged QTC, but I suspect that this is due to his hypocalcemia.  With correction of his calcium if his QTC remains prolonged, then we will need to consider switching him off of amiodarone.  Lastly, the patient certainly has atrial fibrillation, but his CHA2DS2-VASc score is only 1.  Therefore, it is not a necessity that he be on anticoagulation particularly in the setting of pancytopenia and increased bleeding risk.  Will defer starting anticoagulation in the setting of critical illness, but can revisit the need for long-term anticoagulation with the patient as he continues to recover. -Continue amiodarone for now with plan for 5 g load -Maintain telemetry -Daily EKGs for QTC monitoring -Treatment of hypercalcemia per nephrology -Discussed safety and utility of anticoagulation with the patient once more stable -TTE   Risk Assessment/Risk Scores:          CHA2DS2-VASc Score = 1   This indicates a 0.6% annual risk of stroke. The patient's score is based upon:           For questions or updates, please contact [place] Please consult [email or web address] for contact info under    Signed, Hershal Coria, MD  02/15/2021 4:37 AM

## 2021-02-15 NOTE — Progress Notes (Signed)
  Echocardiogram 2D Echocardiogram has been performed.  Antonio Keller 02/15/2021, 2:45 PM

## 2021-02-15 NOTE — Plan of Care (Signed)

## 2021-02-15 NOTE — H&P (Signed)
NAME:  Antonio Keller, MRN:  [id number], DOB:  Jul 20, 1960, LOS: 1 ADMISSION DATE:  02/14/2021, CONSULTATION DATE:  9/16 REFERRING MD:  Tegeler MD, CHIEF COMPLAINT:  N&V, abdominal pain   History of Present Illness:   Antonio Keller ,is a 60 y.o. male, who presented to the [facility] ED with a chief complaint of nausea, vomiting, and abdominal pain.  Reportedly, the patient has been having multiple episode of emesis in the last two weeks which has worsened this week. He has been unable to keep food down and endorses intermittent abdominal pain. He became increasingly fatigued on 9/16 and EMS was called.  ED course was notable for ABG of 7.22/30/114/12.5, Trop 99, MG 2.0, K 4.1, HGB 8.5, lipase 216. Lactate 0.7, AG 21, BUN 180 While in the ED he developed afib with RVR and was started on an amio gtt. Neph and Cardiology were consulted by the ED.   Significant Hospital Events: Including procedures, antibiotic start and stop dates in addition to other pertinent events   9/16 Admit. Afib RVR. Amio load  Interim History / Subjective:  Patient stated abdominal pain has resolved, denies nausea and vomiting Stated feeling much better than last night   Objective   Blood pressure (!) 141/100, pulse 60, temperature 97.6 F (36.4 C), temperature source Oral, resp. rate (!) 9, weight 80.2 kg, SpO2 96 %.        Intake/Output Summary (Last 24 hours) at 02/15/2021 0945 Last data filed at 02/15/2021 0900 Gross per 24 hour  Intake 1485.98 ml  Output --  Net 1485.98 ml   Filed Weights   02/15/21 0500  Weight: 80.2 kg    Examination:   Physical exam: General: Acute on chronically ill-appearing middle-aged African-American male, lying on the bed HEENT: [place]/AT, eyes anicteric.  Moist mucous membranes Neuro: Alert, awake, following commands, moving all 4 extremities Chest: Coarse breath sounds, no wheezes or rhonchi Heart: Regular rate and rhythm, no murmurs or gallops Abdomen: Soft, nontender,  nondistended, bowel sounds present Skin: No rash   Resolved Hospital Problem list     Assessment & Plan:  End-stage renal disease, have not been started on hemodialysis yet High anion gap metabolic acidosis Nephrology is following Plan is to have tunneled dialysis catheter placed by IR today and initiation of hemodialysis No signs of uremic encephalopathy for now Closely monitor Continue IV bicarbonate infusion Trend BMP and electrolytes  Paroxysmal atrial fibrillation Prolonged QT Cardiology input is appreciated Continue amiodarone gtt Goal K above 4, goal MG above CHA2DS2-VASc score is 1 Will start patient on aspirin once to start dialysis  Demand cardiac ischemia Trend troponins Echocardiogram is pending  Hypertension Blood pressure is well controlled, hold antihypertensive for now  Pancytopenia Suspect secondary to chronic kidney disease. No signs of active bleeding. PLT 82, WBC 3.2, HBG 7.8. Similar to 11/2020 CBC. Follow-up CBC  Best Practice (right click and "Reselect all SmartList Selections" daily)   Diet/type: NPO DVT prophylaxis: SCD GI prophylaxis: PPI Lines: N/A Foley:  N/A Code Status:  full code Last date of multidisciplinary goals of care discussion [9/16 Full scope of care]  Labs   CBC: Recent Labs  Lab 02/14/21 1611 02/14/21 2102 02/15/21 0618  WBC 3.2*  --  3.3*  NEUTROABS 1.6*  --   --   HGB 7.8* 8.5* 7.2*  HCT 23.6* 25.0* 21.4*  MCV 80.8  --  79.6*  PLT 82*  --  72*    Basic Metabolic Panel: Recent Labs  Lab 02/14/21 1611  02/14/21 1954 02/14/21 2102 02/15/21 0618  NA 135  --  135 135  K 4.2  --  4.1 3.5  CL 101  --   --  99  CO2 13*  --   --  16*  GLUCOSE 89  --   --  129*  BUN 180*  --   --  177*  CREATININE 27.29*  --   --  PENDING  CALCIUM 6.5*  --   --  6.5*  MG  --  2.0  --  2.0  PHOS  --   --   --  10.4*   GFR: CrCl cannot be calculated (This lab value cannot be used to calculate CrCl because it is not a number:  PENDING). Recent Labs  Lab 02/14/21 1611 02/15/21 0618  WBC 3.2* 3.3*  LATICACIDVEN 0.7  --     Liver Function Tests: Recent Labs  Lab 02/14/21 1611  AST 36  ALT 29  ALKPHOS 79  BILITOT 0.7  PROT 6.9  ALBUMIN 3.6   Recent Labs  Lab 02/14/21 1954 02/15/21 0618  LIPASE 216* 378*   No results for input(s): AMMONIA in the last 168 hours.  ABG    Component Value Date/Time   PHART 7.228 (L) 02/14/2021 2102   PCO2ART 30.0 (L) 02/14/2021 2102   PO2ART 114 (H) 02/14/2021 2102   HCO3 12.5 (L) 02/14/2021 2102   TCO2 13 (L) 02/14/2021 2102   ACIDBASEDEF 14.0 (H) 02/14/2021 2102   O2SAT 98.0 02/14/2021 2102     Coagulation Profile: Recent Labs  Lab 02/14/21 1954  INR 1.3*    Cardiac Enzymes: No results for input(s): CKTOTAL, CKMB, CKMBINDEX, TROPONINI in the last 168 hours.  HbA1C: No results found for: HGBA1C  CBG: Recent Labs  Lab 02/15/21 0037 02/15/21 0313 02/15/21 0745  GLUCAP 124* 140* 126*     Total critical care time: 37 minutes  Performed by: [place] care time was exclusive of separately billable procedures and treating other patients.   Critical care was necessary to treat or prevent imminent or life-threatening deterioration.   Critical care was time spent personally by me on the following activities: development of treatment plan with patient and/or surrogate as well as nursing, discussions with consultants, evaluation of patient's response to treatment, examination of patient, obtaining history from patient or surrogate, ordering and performing treatments and interventions, ordering and review of laboratory studies, ordering and review of radiographic studies, pulse oximetry and re-evaluation of patient's condition.   Jacky Kindle MD [facility] See Amion for pager If no response to pager, please call [phone] until 7pm After 7pm, Please call E-link [phone]

## 2021-02-15 NOTE — ED Notes (Addendum)
Report given to rn on 3m 

## 2021-02-16 ENCOUNTER — Inpatient Hospital Stay (HOSPITAL_COMMUNITY): Payer: BC Managed Care – PPO

## 2021-02-16 DIAGNOSIS — R7989 Other specified abnormal findings of blood chemistry: Secondary | ICD-10-CM

## 2021-02-16 DIAGNOSIS — Z992 Dependence on renal dialysis: Secondary | ICD-10-CM

## 2021-02-16 DIAGNOSIS — N186 End stage renal disease: Secondary | ICD-10-CM

## 2021-02-16 DIAGNOSIS — I5042 Chronic combined systolic (congestive) and diastolic (congestive) heart failure: Secondary | ICD-10-CM | POA: Diagnosis not present

## 2021-02-16 DIAGNOSIS — I4891 Unspecified atrial fibrillation: Secondary | ICD-10-CM | POA: Diagnosis not present

## 2021-02-16 DIAGNOSIS — I5020 Unspecified systolic (congestive) heart failure: Secondary | ICD-10-CM | POA: Diagnosis not present

## 2021-02-16 LAB — GLUCOSE, CAPILLARY
Glucose-Capillary: 108 mg/dL — ABNORMAL HIGH (ref 70–99)
Glucose-Capillary: 129 mg/dL — ABNORMAL HIGH (ref 70–99)

## 2021-02-16 LAB — BASIC METABOLIC PANEL
Anion gap: 21 — ABNORMAL HIGH (ref 5–15)
BUN: 177 mg/dL — ABNORMAL HIGH (ref 6–20)
CO2: 19 mmol/L — ABNORMAL LOW (ref 22–32)
Calcium: 6 mg/dL — CL (ref 8.9–10.3)
Chloride: 96 mmol/L — ABNORMAL LOW (ref 98–111)
Creatinine, Ser: 26.48 mg/dL — ABNORMAL HIGH (ref 0.61–1.24)
GFR, Estimated: 2 mL/min — ABNORMAL LOW (ref >60)
Glucose, Bld: 139 mg/dL — ABNORMAL HIGH (ref 70–99)
Potassium: 3 mmol/L — ABNORMAL LOW (ref 3.5–5.1)
Sodium: 136 mmol/L (ref 135–145)

## 2021-02-16 LAB — HEPATITIS B SURFACE ANTIBODY,QUALITATIVE: Hep B S Ab: NONREACTIVE

## 2021-02-16 LAB — CBC
HCT: 20.4 % — ABNORMAL LOW (ref 39.0–52.0)
Hemoglobin: 7 g/dL — ABNORMAL LOW (ref 13.0–17.0)
MCH: 26.6 pg (ref 26.0–34.0)
MCHC: 34.3 g/dL (ref 30.0–36.0)
MCV: 77.6 fL — ABNORMAL LOW (ref 80.0–100.0)
Platelets: 68 10*3/uL — ABNORMAL LOW (ref 150–400)
RBC: 2.63 MIL/uL — ABNORMAL LOW (ref 4.22–5.81)
RDW: 15.1 % (ref 11.5–15.5)
WBC: 3.6 10*3/uL — ABNORMAL LOW (ref 4.0–10.5)
nRBC: 0 % (ref 0.0–0.2)

## 2021-02-16 LAB — MAGNESIUM: Magnesium: 1.9 mg/dL (ref 1.7–2.4)

## 2021-02-16 LAB — PHOSPHORUS: Phosphorus: 9.4 mg/dL — ABNORMAL HIGH (ref 2.5–4.6)

## 2021-02-16 LAB — HEPARIN LEVEL (UNFRACTIONATED): Heparin Unfractionated: <0.1 IU/mL — ABNORMAL LOW (ref 0.30–0.70)

## 2021-02-16 MED ORDER — ALTEPLASE 2 MG IJ SOLR: 2.0000 mg | Freq: Once | INTRAMUSCULAR | Status: DC | PRN

## 2021-02-16 MED ORDER — SEVELAMER CARBONATE 800 MG PO TABS: 800.0000 mg | ORAL_TABLET | Freq: Three times a day (TID) | ORAL | Status: DC

## 2021-02-16 MED ORDER — CARVEDILOL 25 MG PO TABS
25.0000 mg | ORAL_TABLET | Freq: Two times a day (BID) | ORAL | Status: DC
  Administered 2021-02-16 – 2021-02-22 (×10): 25 mg via ORAL
  Filled 2021-02-16 (×11): qty 1

## 2021-02-16 MED ORDER — HEPARIN (PORCINE) 25000 UT/250ML-% IV SOLN
1100.0000 [IU]/h | INTRAVENOUS | Status: DC
  Administered 2021-02-16: 1100 [IU]/h via INTRAVENOUS
  Filled 2021-02-16: qty 250

## 2021-02-16 MED ORDER — ENOXAPARIN SODIUM 30 MG/0.3ML IJ SOSY
30.0000 mg | PREFILLED_SYRINGE | INTRAMUSCULAR | Status: DC
  Administered 2021-02-16 – 2021-02-21 (×6): 30 mg via SUBCUTANEOUS
  Filled 2021-02-16 (×6): qty 0.3

## 2021-02-16 MED ORDER — POTASSIUM CHLORIDE 10 MEQ/100ML IV SOLN
10.0000 meq | INTRAVENOUS | Status: AC
  Administered 2021-02-16 (×2): 10 meq via INTRAVENOUS
  Filled 2021-02-16 (×2): qty 100

## 2021-02-16 MED ORDER — "THROMBI-PAD 3""X3"" EX PADS"
1.0000 | MEDICATED_PAD | Freq: Once | CUTANEOUS | Status: AC
  Administered 2021-02-16: 1 via TOPICAL
  Filled 2021-02-16: qty 1

## 2021-02-16 MED ORDER — ISOSORBIDE MONONITRATE ER 30 MG PO TB24
30.0000 mg | ORAL_TABLET | Freq: Every day | ORAL | Status: DC
  Administered 2021-02-16 – 2021-02-22 (×6): 30 mg via ORAL
  Filled 2021-02-16 (×7): qty 1

## 2021-02-16 MED ORDER — SODIUM CHLORIDE 0.9% FLUSH: 10.0000 mL | INTRAVENOUS | Status: DC | PRN

## 2021-02-16 MED ORDER — AMIODARONE HCL 200 MG PO TABS: 200.0000 mg | ORAL_TABLET | Freq: Every day | ORAL | Status: DC

## 2021-02-16 MED ORDER — HEPARIN SODIUM (PORCINE) 1000 UNIT/ML DIALYSIS: 1000.0000 [IU] | INTRAMUSCULAR | Status: DC | PRN

## 2021-02-16 MED ORDER — HYDRALAZINE HCL 50 MG PO TABS
100.0000 mg | ORAL_TABLET | Freq: Two times a day (BID) | ORAL | Status: DC
  Administered 2021-02-16 – 2021-02-22 (×11): 100 mg via ORAL
  Filled 2021-02-16 (×13): qty 2

## 2021-02-16 MED ORDER — ASPIRIN 81 MG PO CHEW
81.0000 mg | CHEWABLE_TABLET | Freq: Every day | ORAL | Status: DC
  Administered 2021-02-16 – 2021-02-17 (×2): 81 mg via ORAL
  Filled 2021-02-16 (×2): qty 1

## 2021-02-16 MED ORDER — SODIUM CHLORIDE 0.9% FLUSH
10.0000 mL | Freq: Two times a day (BID) | INTRAVENOUS | Status: DC
  Administered 2021-02-16 – 2021-02-22 (×10): 10 mL

## 2021-02-16 MED ORDER — CALCIUM CARBONATE 1250 (500 CA) MG PO TABS
1.0000 | ORAL_TABLET | Freq: Three times a day (TID) | ORAL | Status: DC
  Administered 2021-02-16 – 2021-02-22 (×19): 500 mg via ORAL
  Filled 2021-02-16 (×20): qty 1

## 2021-02-16 MED ORDER — SODIUM BICARBONATE 650 MG PO TABS: 1300.0000 mg | ORAL_TABLET | Freq: Three times a day (TID) | ORAL | Status: DC

## 2021-02-16 MED ORDER — CALCIUM ACETATE (PHOS BINDER) 667 MG PO CAPS
1334.0000 mg | ORAL_CAPSULE | Freq: Three times a day (TID) | ORAL | Status: DC
  Administered 2021-02-16 – 2021-02-22 (×19): 1334 mg via ORAL
  Filled 2021-02-16 (×20): qty 2

## 2021-02-16 MED ORDER — AMIODARONE HCL 200 MG PO TABS
400.0000 mg | ORAL_TABLET | Freq: Two times a day (BID) | ORAL | Status: DC
  Administered 2021-02-16 – 2021-02-22 (×12): 400 mg via ORAL
  Filled 2021-02-16 (×13): qty 2

## 2021-02-16 MED ORDER — ATORVASTATIN CALCIUM 40 MG PO TABS
40.0000 mg | ORAL_TABLET | Freq: Every day | ORAL | Status: DC
  Administered 2021-02-16 – 2021-02-22 (×7): 40 mg via ORAL
  Filled 2021-02-16 (×7): qty 1

## 2021-02-16 NOTE — Progress Notes (Signed)
Upper extremity vein mapping has been completed.   Preliminary results in CV Proc.   Antonio Keller 02/16/2021 11:47 AM

## 2021-02-16 NOTE — Progress Notes (Signed)
Antonio Keller Progress Note    Assessment/ Plan:   CKD5, 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 HD#1 9/18, #2 treatment tomorrow -Consulted VVS for permanent access, appreciate assistance -CLIP for outpatient placement -Continue to monitor daily Cr, Dose meds for GFR<15 -Monitor Daily I/Os, Daily weight  -Maintain MAP>65 for optimal renal perfusion.  -Agree with holding ACE-I, avoid further nephrotoxins including NSAIDS, Morphine.  Unless absolutely necessary, avoid CT with contrast and/or MRI with gadolinium.      Vomiting, nausea -likely uremic symptoms, starting HD as above   Hypocalcemia -replete PRN, 3.5Ca bath with HD, switching binder to phoslo. On oscal   Afib w/ RVR -cardio on board, currently on amio   Hypertension: -home meds on hold, BP acceptable   Metabolic acidosis, metabolic acidemia -2/2 to severe CKD -on nahco3 PO, will stop this now that he is on HD   Anemia due to chronic kidney disease: -Transfuse for Hgb<7 g/dL -iron panel ordered  CKD-MBD -PTH ordered -will change renvela to phoslo given hypocalcemia   Antonio Keller [facility] Keller  Subjective:   Just finished HD earlier, net uf 0.5L. Tolerated well. No complaints. Discussed permanent access (AVF/AVG) and patient is accepting of this.   Objective:   BP (!) 159/98   Pulse 66   Temp 98 F (36.7 C) (Oral)   Resp (!) 9   Wt 82.9 kg   SpO2 97%   BMI 24.79 kg/m   Intake/Output Summary (Last 24 hours) at 02/16/2021 0905 Last data filed at 02/16/2021 0800 Gross per 24 hour  Intake 3004.55 ml  Output 1050 ml  Net 1954.55 ml   Weight change: 2.7 kg  Physical Exam: Gen:nad CVS:rrr Resp:normal wob DZH:GDJM Ext:no edema Neuro: no focal deficits HD access: [facility]  Imaging: DG Chest 2 View  Result Date:  02/14/2021 CLINICAL DATA:  Shortness of breath EXAM: CHEST - 2 VIEW COMPARISON:  Chest x-ray dated November 18, 2020 FINDINGS: Inferior cardiophrenic angles are excluded from the field-of-view on AP image. Unchanged cardiomegaly and tortuosity of the thoracic aorta. Lungs are clear. No evidence of pleural effusion or pneumothorax. IMPRESSION: No active cardiopulmonary disease. Electronically Signed   By: Yetta Glassman M.D.   On: 02/14/2021 17:03   IR Fluoro Guide CV Line Right  Result Date: 02/15/2021 INDICATION: ESRD requiring HD. EXAM: TUNNELED CENTRAL VENOUS HEMODIALYSIS CATHETER PLACEMENT WITH ULTRASOUND AND FLUOROSCOPIC GUIDANCE MEDICATIONS: None. ANESTHESIA/SEDATION: Moderate (conscious) sedation was employed during this procedure. A total of Versed 1 mg and Fentanyl 50 mcg was administered intravenously. Moderate Sedation Time: 22 minutes. The patient's level of consciousness and vital signs were monitored continuously by radiology nursing throughout the procedure under my direct supervision. FLUOROSCOPY TIME:  0 minutes 18 seconds (1 mGy). COMPLICATIONS: None immediate. PROCEDURE: Informed written consent was obtained from the patient after a discussion of the risks, benefits, and alternatives to treatment. Questions regarding the procedure were encouraged and answered. The right neck and chest were prepped with chlorhexidine in a sterile fashion, and a sterile drape was applied covering the operative field. Maximum barrier sterile technique with sterile gowns and gloves were used for the procedure. A timeout was performed prior to the initiation of the procedure. After creating a small venotomy incision, a micropuncture kit was utilized to access the internal jugular vein. Real-time ultrasound guidance was utilized for vascular access including the  acquisition of a permanent ultrasound image documenting patency of the accessed vessel. The microwire was utilized to measure appropriate catheter length. A  stiff Glidewire was advanced to the level of the IVC and the micropuncture sheath was exchanged for a peel-away sheath. A tunneled hemodialysis catheter measuring 23 cm from tip to cuff was tunneled in a retrograde fashion from the anterior chest wall to the venotomy incision. The catheter was then placed through the peel-away sheath with tips ultimately positioned within the superior aspect of the right atrium. Final catheter positioning was confirmed and documented with a spot radiographic image. The catheter aspirates and flushes normally. The catheter was flushed with appropriate volume heparin dwells. The catheter exit site was secured with a 2-0 nylon retention suture. The venotomy incision was closed with Dermabond. Dressings were applied. The patient tolerated the procedure well without immediate post procedural complication. IMPRESSION: Successful placement of 23 cm tip to cuff tunneled hemodialysis catheter via the right internal jugular vein with tips terminating within the superior aspect of the right atrium. The catheter is ready for immediate use. Michaelle Birks, MD [facility] Radiology Electronically Signed   By: Michaelle Birks M.D.   On: 02/15/2021 13:46   IR US Guide Vasc Access Right  Result Date: 02/15/2021 INDICATION: ESRD requiring HD. EXAM: TUNNELED CENTRAL VENOUS HEMODIALYSIS CATHETER PLACEMENT WITH ULTRASOUND AND FLUOROSCOPIC GUIDANCE MEDICATIONS: None. ANESTHESIA/SEDATION: Moderate (conscious) sedation was employed during this procedure. A total of Versed 1 mg and Fentanyl 50 mcg was administered intravenously. Moderate Sedation Time: 22 minutes. The patient's level of consciousness and vital signs were monitored continuously by radiology nursing throughout the procedure under my direct supervision. FLUOROSCOPY TIME:  0 minutes 18 seconds (1 mGy). COMPLICATIONS: None immediate. PROCEDURE: Informed written consent was obtained from the patient  after a discussion of the risks, benefits, and alternatives to treatment. Questions regarding the procedure were encouraged and answered. The right neck and chest were prepped with chlorhexidine in a sterile fashion, and a sterile drape was applied covering the operative field. Maximum barrier sterile technique with sterile gowns and gloves were used for the procedure. A timeout was performed prior to the initiation of the procedure. After creating a small venotomy incision, a micropuncture kit was utilized to access the internal jugular vein. Real-time ultrasound guidance was utilized for vascular access including the acquisition of a permanent ultrasound image documenting patency of the accessed vessel. The microwire was utilized to measure appropriate catheter length. A stiff Glidewire was advanced to the level of the IVC and the micropuncture sheath was exchanged for a peel-away sheath. A tunneled hemodialysis catheter measuring 23 cm from tip to cuff was tunneled in a retrograde fashion from the anterior chest wall to the venotomy incision. The catheter was then placed through the peel-away sheath with tips ultimately positioned within the superior aspect of the right atrium. Final catheter positioning was confirmed and documented with a spot radiographic image. The catheter aspirates and flushes normally. The catheter was flushed with appropriate volume heparin dwells. The catheter exit site was secured with a 2-0 nylon retention suture. The venotomy incision was closed with Dermabond. Dressings were applied. The patient tolerated the procedure well without immediate post procedural complication. IMPRESSION: Successful placement of 23 cm tip to cuff tunneled hemodialysis catheter via the right internal jugular vein with tips terminating within the superior aspect of the right atrium. The catheter is ready for immediate use. Michaelle Birks, MD [facility] Radiology  Electronically Signed   By: Michaelle Birks M.D.   On: 02/15/2021 13:46   DG Chest Port 1 View  Result Date: 02/14/2021 CLINICAL DATA:  Chest pain EXAM: PORTABLE CHEST 1 VIEW COMPARISON:  02/14/2021 FINDINGS: Mild cardiomegaly. No focal opacity or pleural effusion. Aortic atherosclerosis. No pneumothorax IMPRESSION: No active disease.  Mild cardiomegaly Electronically Signed   By: Donavan Foil M.D.   On: 02/14/2021 23:19   ECHOCARDIOGRAM COMPLETE  Result Date: 02/15/2021    ECHOCARDIOGRAM REPORT   Patient Name:   NORMAN PIACENTINI Date of Exam: 02/15/2021 Medical Rec #:  [id number]     Height:       72.0 in Accession #:    [id number]    Weight:       176.8 lb Date of Birth:  [birth year]     BSA:          2.022 m Patient Age:    60 years      BP:           141/99 mmHg Patient Gender: M             HR:           57 bpm. Exam Location:  Inpatient Procedure: 2D Echo, 3D Echo, Cardiac Doppler and Color Doppler Indications:    Atrial fibrillation  History:        Patient has no prior history of Echocardiogram examinations.                 Risk Factors:Hypertension. ESRD.  Sonographer:    Clayton Lefort RDCS (AE) Referring Phys: [id number] [place]  1. Left ventricular ejection fraction, by estimation, is 40 to 45%. The left ventricle has mildly decreased function. The left ventricle has no regional wall motion abnormalities. There is severe concentric left ventricular hypertrophy. Left ventricular  diastolic parameters are consistent with Grade II diastolic dysfunction (pseudonormalization). Elevated left ventricular end-diastolic pressure.  2. Right ventricular systolic function is normal. The right ventricular size is normal. Tricuspid regurgitation signal is inadequate for assessing PA pressure.  3. Left atrial size was severely dilated.  4. Right atrial size was mildly dilated.  5. The mitral valve is grossly normal. Mild mitral valve regurgitation.  6. The aortic valve is tricuspid. There is mild  thickening of the aortic valve. Aortic valve regurgitation is trivial. No aortic stenosis is present. Aortic valve mean gradient measures 6.0 mmHg.  7. Aortic dilatation noted. There is mild dilatation of the aortic root, measuring 41 mm.  8. The inferior vena cava is normal in size with <50% respiratory variability, suggesting right atrial pressure of 8 mmHg. Comparison(s): No prior Echocardiogram. FINDINGS  Left Ventricle: Left ventricular ejection fraction, by estimation, is 40 to 45%. The left ventricle has mildly decreased function. The left ventricle has no regional wall motion abnormalities. The left ventricular internal cavity size was normal in size. There is severe concentric left ventricular hypertrophy. Left ventricular diastolic parameters are consistent with Grade II diastolic dysfunction (pseudonormalization). Elevated left ventricular end-diastolic pressure. Right Ventricle: The right ventricular size is normal. No increase in right ventricular wall thickness. Right ventricular systolic function is normal. Tricuspid regurgitation signal is inadequate for assessing PA pressure. Left Atrium: Left atrial size was severely dilated. Right Atrium: Right atrial size was mildly dilated. Pericardium: There is no evidence of pericardial effusion. Mitral Valve: The mitral valve is grossly normal. Mild mitral valve regurgitation. MV peak gradient, 6.6 mmHg. The mean mitral valve gradient is 2.0 mmHg. Tricuspid  Valve: The tricuspid valve is grossly normal. Tricuspid valve regurgitation is trivial. Aortic Valve: The aortic valve is tricuspid. There is mild thickening of the aortic valve. There is mild aortic valve annular calcification. Aortic valve regurgitation is trivial. No aortic stenosis is present. Aortic valve mean gradient measures 6.0 mmHg. Aortic valve peak gradient measures 9.0 mmHg. Aortic valve area, by VTI measures 2.31 cm. Pulmonic Valve: The pulmonic valve was grossly normal. Pulmonic valve  regurgitation is trivial. Aorta: Aortic dilatation noted. There is mild dilatation of the aortic root, measuring 41 mm. Venous: The inferior vena cava is normal in size with less than 50% respiratory variability, suggesting right atrial pressure of 8 mmHg. IAS/Shunts: No atrial level shunt detected by color flow Doppler.  LEFT VENTRICLE PLAX 2D LVIDd:         5.60 cm      Diastology LVIDs:         4.30 cm      LV e' medial:    4.95 cm/s LV PW:         2.00 cm      LV E/e' medial:  23.6 LV IVS:        2.00 cm      LV e' lateral:   10.10 cm/s LVOT diam:     2.30 cm      LV E/e' lateral: 11.6 LV SV:         81 LV SV Index:   40 LVOT Area:     4.15 cm                              3D Volume EF: LV Volumes (MOD)            3D EF:        45 % LV vol d, MOD A2C: 190.0 ml LV EDV:       232 ml LV vol d, MOD A4C: 166.0 ml LV ESV:       127 ml LV vol s, MOD A2C: 101.0 ml LV SV:        105 ml LV vol s, MOD A4C: 93.5 ml LV SV MOD A2C:     89.0 ml LV SV MOD A4C:     166.0 ml LV SV MOD BP:      88.2 ml RIGHT VENTRICLE             IVC RV Basal diam:  4.00 cm     IVC diam: 2.00 cm RV Mid diam:    2.20 cm RV S prime:     11.90 cm/s TAPSE (M-mode): 3.0 cm LEFT ATRIUM              Index       RIGHT ATRIUM           Index LA diam:        4.30 cm  2.13 cm/m  RA Area:     22.30 cm LA Vol (A2C):   116.0 ml 57.37 ml/m RA Volume:   70.00 ml  34.62 ml/m LA Vol (A4C):   115.0 ml 56.88 ml/m LA Biplane Vol: 120.0 ml 59.35 ml/m  AORTIC VALVE AV Area (Vmax):    2.39 cm AV Area (Vmean):   2.21 cm AV Area (VTI):     2.31 cm AV Vmax:           150.00 cm/s AV Vmean:  115.000 cm/s AV VTI:            0.350 m AV Peak Grad:      9.0 mmHg AV Mean Grad:      6.0 mmHg LVOT Vmax:         86.20 cm/s LVOT Vmean:        61.100 cm/s LVOT VTI:          0.195 m LVOT/AV VTI ratio: 0.56  AORTA Ao Root diam: 4.10 cm Ao Asc diam:  3.70 cm MITRAL VALVE MV Area (PHT): 2.38 cm     SHUNTS MV Area VTI:   2.19 cm     Systemic VTI:  0.20 m MV Peak grad:   6.6 mmHg     Systemic Diam: 2.30 cm MV Mean grad:  2.0 mmHg MV Vmax:       1.28 m/s MV Vmean:      64.6 cm/s MV Decel Time: 319 msec MR Peak grad: 104.9 mmHg MR Mean grad: 68.0 mmHg MR Vmax:      512.00 cm/s MR Vmean:     387.0 cm/s MV E velocity: 117.00 cm/s MV A velocity: 58.70 cm/s MV E/A ratio:  1.99 Rozann Lesches MD Electronically signed by Rozann Lesches MD Signature Date/Time: 02/15/2021/3:01:03 PM    Final    US Abdomen Limited RUQ (LIVER/GB)  Result Date: 02/14/2021 CLINICAL DATA:  Epigastric pain EXAM: ULTRASOUND ABDOMEN LIMITED RIGHT UPPER QUADRANT COMPARISON:  Ultrasound 12/18/2020 FINDINGS: Gallbladder: No shadowing stones. Possible mild focal wall thickening up to 3.5 mm. Negative sonographic Murphy. Trace pericholecystic fluid Common bile duct: Diameter: 4.5 mm Liver: No focal lesion identified. Within normal limits in parenchymal echogenicity. Portal vein is patent on color Doppler imaging with normal direction of blood flow towards the liver. Other: Echogenic right kidney consistent with medical renal disease. IMPRESSION: 1. Negative for gallstones. There may be mild focal wall thickening but there is a negative sonographic Murphy. Trace fluid adjacent to gallbladder. Findings not strongly suggestive of acute gallbladder disease; if continued concern, nuclear medicine hepatobiliary imaging may be obtained. 2. Echogenic right kidney consistent with medical renal disease Electronically Signed   By: Donavan Foil M.D.   On: 02/14/2021 23:57    Labs: BMET Recent Labs  Lab 02/14/21 1611 02/14/21 2102 02/15/21 0618 02/16/21 0154  NA 135 135 135 136  K 4.2 4.1 3.5 3.0*  CL 101  --  99 96*  CO2 13*  --  16* 19*  GLUCOSE 89  --  129* 139*  BUN 180*  --  177* 177*  CREATININE 27.29*  --  27.50* 26.48*  CALCIUM 6.5*  --  6.5* 6.0*  PHOS  --   --  10.4* 9.4*   CBC Recent Labs  Lab 02/14/21 1611 02/14/21 2102 02/15/21 0618 02/16/21 0154  WBC 3.2*  --  3.3* 3.6*  NEUTROABS 1.6*   --   --   --   HGB 7.8* 8.5* 7.2* 7.0*  HCT 23.6* 25.0* 21.4* 20.4*  MCV 80.8  --  79.6* 77.6*  PLT 82*  --  72* 68*    Medications:     calcium carbonate  1 tablet Oral TID WC   carvedilol  25 mg Oral BID WC   Chlorhexidine Gluconate Cloth  6 each Topical Daily   Chlorhexidine Gluconate Cloth  6 each Topical Q0600   hydrALAZINE  100 mg Oral Q12H   pantoprazole (PROTONIX) IV  40 mg Intravenous QHS   sevelamer carbonate  800 mg Oral  TID WC   sodium bicarbonate  1,300 mg Oral TID   sodium chloride flush  10-40 mL Intracatheter Q12H      Antonio Quint, MD [facility] 02/16/2021, 9:05 AM

## 2021-02-16 NOTE — Progress Notes (Signed)
Routine Hep B testing of new HD patients show's this patient is Hep B SAg positive and will need to be run in the isolation room. HD will be done tomorrow morning instead of today.    Kelly Splinter, MD 02/16/2021, 8:16 PM

## 2021-02-16 NOTE — Progress Notes (Addendum)
NAME:  Antonio Keller, MRN:  [id number], DOB:  1961-05-08, LOS: 2 ADMISSION DATE:  02/14/2021, CONSULTATION DATE:  9/16 REFERRING MD:  Tegeler MD, CHIEF COMPLAINT:  N&V, abdominal pain   History of Present Illness:   Antonio Keller ,is a 60 y.o. male, who presented to the [facility] ED with a chief complaint of nausea, vomiting, and abdominal pain.  Reportedly, the patient has been having multiple episode of emesis in the last two weeks which has worsened this week. He has been unable to keep food down and endorses intermittent abdominal pain. He became increasingly fatigued on 9/16 and EMS was called.  ED course was notable for ABG of 7.22/30/114/12.5, Trop 99, MG 2.0, K 4.1, HGB 8.5, lipase 216. Lactate 0.7, AG 21, BUN 180 While in the ED he developed afib with RVR and was started on an amio gtt. Neph and Cardiology were consulted by the ED.   Significant Hospital Events: Including procedures, antibiotic start and stop dates in addition to other pertinent events   9/16 Admit. Afib RVR. Amio load  Interim History / Subjective:  No overnight issues Patient received his first hemodialysis session this morning, tolerated well   Objective   Blood pressure (!) 159/98, pulse 66, temperature 97.9 F (36.6 C), temperature source Oral, resp. rate (!) 9, weight 82.9 kg, SpO2 97 %.        Intake/Output Summary (Last 24 hours) at 02/16/2021 0956 Last data filed at 02/16/2021 0800 Gross per 24 hour  Intake 3004.55 ml  Output 1050 ml  Net 1954.55 ml   Filed Weights   02/15/21 0500 02/16/21 0347  Weight: 80.2 kg 82.9 kg    Examination:   Physical exam: General: Acute on chronically ill-appearing middle-aged African-American male, lying on the bed HEENT: [place]/AT, eyes anicteric.  Moist mucous membranes Neuro: Alert, awake, following commands, moving all 4 extremities Chest: Clear to auscultation bilaterally, no wheezes rhonchi Heart: Regular rate and rhythm, no murmurs or gallops Abdomen: Soft,  nontender, nondistended, bowel sounds present Skin: No rash   Resolved Hospital Problem list     Assessment & Plan:  End-stage renal disease, newly started on hemodialysis High anion gap metabolic acidosis Nephrology is following Received his first HD session this morning, tolerated well No signs of uremic encephalopathy for now Closely monitor Bicarbonate infusion was stopped Trend BMP and electrolytes  Paroxysmal atrial fibrillation Prolonged QT Cardiology input is appreciated Patient converted to sinus rhythm Amiodarone infusion was stopped Started on home Coreg Goal K above 4, goal MG above CHADS2-VASc score is 2 Started on IV heparin for stroke prophylaxis  Chronic systolic and diastolic congestive heart failure Demand cardiac ischemia Trend troponins Echocardiogram confirmed systolic and diastolic congestive heart failure with EF 40 to 45% Continue Coreg and hydralazine If blood pressure remains stable, can restart Imdur  Hypertension Blood pressure is well controlled Started back on Coreg and hydralazine  Pancytopenia Suspect secondary to chronic kidney disease.  Follow-up CBC Hemoglobin is at 7, transfuse if drops below 7  Best Practice (right click and "Reselect all SmartList Selections" daily)   Diet/type: Renal diet DVT prophylaxis: Subcu Lovenox GI prophylaxis: N/A Lines: N/A Foley:  N/A Code Status:  full code Last date of multidisciplinary goals of care discussion [9/16 Full scope of care]  Labs   CBC: Recent Labs  Lab 02/14/21 1611 02/14/21 2102 02/15/21 0618 02/16/21 0154  WBC 3.2*  --  3.3* 3.6*  NEUTROABS 1.6*  --   --   --   HGB  7.8* 8.5* 7.2* 7.0*  HCT 23.6* 25.0* 21.4* 20.4*  MCV 80.8  --  79.6* 77.6*  PLT 82*  --  72* 68*    Basic Metabolic Panel: Recent Labs  Lab 02/14/21 1611 02/14/21 1954 02/14/21 2102 02/15/21 0618 02/16/21 0154  NA 135  --  135 135 136  K 4.2  --  4.1 3.5 3.0*  CL 101  --   --  99 96*  CO2 13*   --   --  16* 19*  GLUCOSE 89  --   --  129* 139*  BUN 180*  --   --  177* 177*  CREATININE 27.29*  --   --  27.50* 26.48*  CALCIUM 6.5*  --   --  6.5* 6.0*  MG  --  2.0  --  2.0 1.9  PHOS  --   --   --  10.4* 9.4*   GFR: Estimated Creatinine Clearance: 3.3 mL/min (A) (by C-G formula based on SCr of 26.48 mg/dL (H)). Recent Labs  Lab 02/14/21 1611 02/15/21 0618 02/16/21 0154  WBC 3.2* 3.3* 3.6*  LATICACIDVEN 0.7  --   --     Liver Function Tests: Recent Labs  Lab 02/14/21 1611  AST 36  ALT 29  ALKPHOS 79  BILITOT 0.7  PROT 6.9  ALBUMIN 3.6   Recent Labs  Lab 02/14/21 1954 02/15/21 0618  LIPASE 216* 378*   No results for input(s): AMMONIA in the last 168 hours.  ABG    Component Value Date/Time   PHART 7.228 (L) 02/14/2021 2102   PCO2ART 30.0 (L) 02/14/2021 2102   PO2ART 114 (H) 02/14/2021 2102   HCO3 12.5 (L) 02/14/2021 2102   TCO2 13 (L) 02/14/2021 2102   ACIDBASEDEF 14.0 (H) 02/14/2021 2102   O2SAT 98.0 02/14/2021 2102     Coagulation Profile: Recent Labs  Lab 02/14/21 1954  INR 1.3*    Cardiac Enzymes: No results for input(s): CKTOTAL, CKMB, CKMBINDEX, TROPONINI in the last 168 hours.  HbA1C: No results found for: HGBA1C  CBG: Recent Labs  Lab 02/15/21 0037 02/15/21 0313 02/15/21 0745 02/15/21 1154  GLUCAP 124* 140* [facility] See Amion for pager If no response to pager, please call [phone] until 7pm After 7pm, Please call E-link [phone]

## 2021-02-16 NOTE — Progress Notes (Signed)
Date and time results received: 02/16/21 0300 (use smartphrase ".now" to insert current time)  Test: Calcium Critical Value: 6.0  Name of Provider Notified: Elink  Orders Received? Or Actions Taken?:

## 2021-02-16 NOTE — Progress Notes (Signed)
Patient called me into his room to notify me that his HD tunneled catheter site was bleeding.  Notified MD, orders received to stop heparin gtt and place thrombi pad in place.  Dressing was changed, along with antimicrobial disc and placed thrombi place underneath disc.  Will continue to monitor patient.

## 2021-02-16 NOTE — Progress Notes (Signed)
ANTICOAGULATION CONSULT NOTE - Initial Consult  Pharmacy Consult for heparin Indication: atrial fibrillation  No Known Allergies  Patient Measurements: Weight: 82.9 kg (182 lb 12.2 oz) Heparin Dosing Weight: 82 kg  Vital Signs: Temp: 97.9 F (36.6 C) (09/18 0700) Temp Source: Oral (09/18 0700) BP: 143/98 (09/18 1030) Pulse Rate: 67 (09/18 1030)  Labs: Recent Labs    02/14/21 1611 02/14/21 1954 02/14/21 2102 02/14/21 2216 02/15/21 0618 02/16/21 0154  HGB 7.8*  --  8.5*  --  7.2* 7.0*  HCT 23.6*  --  25.0*  --  21.4* 20.4*  PLT 82*  --   --   --  72* 68*  LABPROT  --  16.3*  --   --   --   --   INR  --  1.3*  --   --   --   --   CREATININE 27.29*  --   --   --  27.50* 26.48*  TROPONINIHS  --  99*  --  82*  --   --     Estimated Creatinine Clearance: 3.3 mL/min (A) (by C-G formula based on SCr of 26.48 mg/dL (H)).  Assessment: 60 yo f now with new onset afib  Cbc low - plt 68, per cards ok to continue heparin as long as plt > 50  No Anticoag PTA  CHA2DS2/VAS Stroke Risk Points  Current as of 7 minutes ago     2 >= 2 Points: High Risk  1 - 1.99 Points: Medium Risk  0 Points: Low Risk    No Change      Details    This score determines the patient's risk of having a stroke if the  patient has atrial fibrillation.       Points Metrics  1 Has Congestive Heart Failure:  Yes     Current as of 7 minutes ago  0 Has Vascular Disease:  No    Current as of 7 minutes ago  1 Has Hypertension:  Yes    Current as of 7 minutes ago  0 Age:  60    Current as of 7 minutes ago  0 Has Diabetes:  No    Current as of 7 minutes ago  0 Had Stroke:  No  Had TIA:  No  Had Thromboembolism:  No    Current as of 7 minutes ago  0 Male:  No    Current as of 7 minutes ago    Goal of Therapy:  Heparin level 0.3-0.7 units/ml Monitor platelets by anticoagulation protocol: Yes   Plan:  Add heparin - no bolus w low platelets Heparin 1100 units/hr Initial lvl 2100 Daily hep lvl  cbc  Barth Kirks, PharmD, BCCCP Clinical Pharmacist [phone]  Please check AMION for all [place] numbers  02/16/2021 10:56 AM

## 2021-02-16 NOTE — H&P (Signed)
H&P Note     CC:  ESRD Requesting Provider:  No ref. provider found  HPI: Antonio Keller is a 60 year old right-handed male who presents to [facility] with end-stage renal disease from uncontrolled HTN.  Patient recently had a right IJ tunneled line catheter placed and was started on dialysis.  Vascular surgery was called for permanent dialysis access.  He has no previous access procedures. On admission, he was complaining of abdominal pain.  This has resolved.   On exam, [name] was resting comfortably with no complaints.  He denied fevers, chills.  He was able to lay flat without issue. Pt with severe systolic heart failure, anemic, thrombocytopenic - 68.  The pt is on a statin for cholesterol management.  The pt is on a daily aspirin.   Other AC:  heparin gtt The pt is on multiple medications for hypertension.   The pt is not diabetic.   Tobacco hx:  none  Past Medical History:  Diagnosis Date   Hypertension    Renal disorder     Past Surgical History:  Procedure Laterality Date   IR FLUORO GUIDE CV LINE RIGHT  02/15/2021   IR US GUIDE VASC ACCESS RIGHT  02/15/2021    Social History   Socioeconomic History   Marital status: Married    Spouse name: Not on file   Number of children: Not on file   Years of education: Not on file   Highest education level: Not on file  Occupational History   Not on file  Tobacco Use   Smoking status: Never   Smokeless tobacco: Never  Substance and Sexual Activity   Alcohol use: Not Currently   Drug use: Never   Sexual activity: Yes    Partners: Female  Other Topics Concern   Not on file  Social History Narrative   Not on file   Social Determinants of Health   Financial Resource Strain: Not on file  Food Insecurity: Not on file  Transportation Needs: Not on file  Physical Activity: Not on file  Stress: Not on file  Social Connections: Not on file  Intimate Partner Violence: Not on file   No family history on  file.  Current Facility-Administered Medications  Medication Dose Route Frequency Provider Last Rate Last Admin   alteplase (CATHFLO ACTIVASE) injection 2 mg  2 mg Intracatheter Once PRN Gean Quint, MD       amiodarone (NEXTERONE) IV bolus only 150 mg/100 mL  150 mg Intravenous Once Hershal Coria, MD   Stopped at 02/14/21 2137   amiodarone (PACERONE) tablet 400 mg  400 mg Oral BID Geralynn Rile, MD   400 mg at 02/16/21 1107   Followed by   Derrill Memo ON 02/23/2021] amiodarone (PACERONE) tablet 200 mg  200 mg Oral Daily O'Neal, Cassie Freer, MD       aspirin chewable tablet 81 mg  81 mg Oral Daily Jacky Kindle, MD   81 mg at 02/16/21 1107   atorvastatin (LIPITOR) tablet 40 mg  40 mg Oral Daily Geralynn Rile, MD   40 mg at 02/16/21 1107   calcium acetate (PHOSLO) capsule 1,334 mg  1,334 mg Oral TID WC Gean Quint, MD   1,334 mg at 02/16/21 1028   calcium carbonate (OS-CAL - dosed in mg of elemental calcium) tablet 500 mg of elemental calcium  1 tablet Oral TID WC Jacky Kindle, MD   500 mg of elemental calcium at 02/16/21 1028   carvedilol (COREG) tablet 25 mg  25 mg Oral BID WC Jacky Kindle, MD   25 mg at 02/16/21 0840   Chlorhexidine Gluconate Cloth 2 % PADS 6 each  6 each Topical Daily Collier Bullock, MD       Chlorhexidine Gluconate Cloth 2 % PADS 6 each  6 each Topical Q0600 Gean Quint, MD       docusate sodium (COLACE) capsule 100 mg  100 mg Oral BID PRN Estill Cotta, NP       enoxaparin (LOVENOX) injection 30 mg  30 mg Subcutaneous Q24H Jacky Kindle, MD   30 mg at 02/16/21 1031   heparin ADULT infusion 100 units/mL (25000 units/222m)  1,100 Units/hr Intravenous Continuous CJacky Kindle MD 11 mL/hr at 02/16/21 1200 1,100 Units/hr at 02/16/21 1200   heparin injection 1,000 Units  1,000 Units Dialysis PRN SGean Quint MD       heparin sodium (porcine) injection    PRN MMichaelle Birks MD   3,800 Units at 02/15/21 1320   hydrALAZINE (APRESOLINE) tablet 100 mg  100  mg Oral Q12H CJacky Kindle MD   100 mg at 02/16/21 1028   isosorbide mononitrate (IMDUR) 24 hr tablet 30 mg  30 mg Oral Daily OGeralynn Rile MD   30 mg at 02/16/21 1107   lidocaine (XYLOCAINE) 1 % (with pres) injection    PRN MMichaelle Birks MD   10 mL at 02/15/21 1312   polyethylene glycol (MIRALAX / GLYCOLAX) packet 17 g  17 g Oral Daily PRN GEstill Cotta NP       sodium chloride flush (NS) 0.9 % injection 10-40 mL  10-40 mL Intracatheter Q12H CJacky Kindle MD   10 mL at 02/16/21 1122   sodium chloride flush (NS) 0.9 % injection 10-40 mL  10-40 mL Intracatheter PRN CJacky Kindle MD        No Known Allergies   REVIEW OF SYSTEMS:   '[X]'$  denotes positive finding, '[ ]'$  denotes negative finding Cardiac  Comments:  Chest pain or chest pressure:    Shortness of breath upon exertion:    Short of breath when lying flat:    Irregular heart rhythm:        Vascular    Pain in calf, thigh, or hip brought on by ambulation:    Pain in feet at night that wakes you up from your sleep:     Blood clot in your veins:    Leg swelling:         Pulmonary    Oxygen at home:    Productive cough:     Wheezing:         Neurologic    Sudden weakness in arms or legs:     Sudden numbness in arms or legs:     Sudden onset of difficulty speaking or slurred speech:    Temporary loss of vision in one eye:     Problems with dizziness:         Gastrointestinal    Blood in stool:     Vomited blood:         Genitourinary    Burning when urinating:     Blood in urine:        Psychiatric    Major depression:         Hematologic    Bleeding problems:    Problems with blood clotting too easily:        Skin    Rashes or ulcers:        Constitutional  Fever or chills:      PHYSICAL EXAMINATION:  Vitals:   02/16/21 1000 02/16/21 1030 02/16/21 1100 02/16/21 1200  BP: (!) 156/99 (!) 143/98 (!) 162/98 (!) 150/97  Pulse: 67 67 64 65  Resp: 16 15 (!) 0 (!) 0  Temp:      TempSrc:       SpO2: 98% 100% 100% 100%  Weight:        General:  WDWN in NAD; vital signs documented above Gait: Not observed HENT: WNL, normocephalic Pulmonary: normal non-labored breathing , without Rales, rhonchi,  wheezing Cardiac: irregular HR, with Murmurs  Abdomen: soft, NT, no masses Skin: with rashes Vascular Exam/Pulses:  Right Left  Radial 2+ (normal) 2+ (normal)  Ulnar 2+ (normal) 2+ (normal)                   Extremities: without ischemic changes, without Gangrene , without cellulitis; without open wounds;  Musculoskeletal: no muscle wasting or atrophy  Neurologic: A&O X 3;  No focal weakness or paresthesias are detected Psychiatric:  The pt has Normal affect.   Non-Invasive Vascular Imaging:   Bilateral lower extremity vein mapping demonstrates adequate cephalic vein for left arm brachiocephalic fistula.    ASSESSMENT/PLAN:  Dilin Brosey is a 60 y.o. male who presents with end stage renal disease  Based on vein mapping and examination, this patient's best permanent access option is: Left brachiocephalic. I would first proceed with left brachiocephalic arteriovenous fistula . I had an extensive discussion with this patient in regards to the nature of access surgery, including risk, benefits, and alternatives.   The patient is aware that the risks of access surgery include but are not limited to: bleeding, infection, steal syndrome, nerve damage, ischemic monomelic neuropathy, failure of access to mature, complications related to venous hypertension, and possible need for additional access procedures in the future.  I discussed with the patient the nature of the staged access procedure, specifically the need for a second operation to transpose the first stage fistula if it matures adequately.   The patient has agreed to proceed with the above procedure which will be scheduled once the patient has been further optimized.   AVF placement is an elective procedure. I would  appreciate improved hemoglobin and resolution of thrombocytopenia prior to proceeding the OR.   Place call me at any time as I am happy to take care of Aydeen and will make time for his procedure once clinically improved.   Broadus John, MD [facility] [phone]

## 2021-02-16 NOTE — Progress Notes (Signed)
Cardiology Progress Note  Patient ID: Antonio Keller MRN: [id number] DOB: 1960-09-16 Date of Encounter: 02/16/2021  Primary Cardiologist: None  Subjective   Chief Complaint: None.   HPI: Started on dialysis.  Paroxysmal A. fib noted.  Denies chest pain or trouble breathing.  Reports his heart was racing prior to admission.  Now hemodialysis.  Denies chest pain or trouble breathing.  ROS:  All other ROS reviewed and negative. Pertinent positives noted in the HPI.     Inpatient Medications  Scheduled Meds:  amiodarone  400 mg Oral BID   Followed by   Derrill Memo ON 02/23/2021] amiodarone  200 mg Oral Daily   aspirin  81 mg Oral Daily   calcium acetate  1,334 mg Oral TID WC   calcium carbonate  1 tablet Oral TID WC   carvedilol  25 mg Oral BID WC   Chlorhexidine Gluconate Cloth  6 each Topical Daily   Chlorhexidine Gluconate Cloth  6 each Topical Q0600   enoxaparin (LOVENOX) injection  30 mg Subcutaneous Q24H   hydrALAZINE  100 mg Oral Q12H   isosorbide mononitrate  30 mg Oral Daily   sodium chloride flush  10-40 mL Intracatheter Q12H   Continuous Infusions:  amiodarone Stopped (02/14/21 2137)   PRN Meds: alteplase, docusate sodium, heparin, heparin sodium (porcine), lidocaine, polyethylene glycol, sodium chloride flush   Vital Signs   Vitals:   02/16/21 0745 02/16/21 0800 02/16/21 0830 02/16/21 0900  BP: (!) 153/111 (!) 153/95  (!) 159/98  Pulse: 65 65 69 66  Resp: 10 (!) 0 16 (!) 9  Temp:      TempSrc:      SpO2: 99% 95% 99% 97%  Weight:        Intake/Output Summary (Last 24 hours) at 02/16/2021 1030 Last data filed at 02/16/2021 0800 Gross per 24 hour  Intake 2887.93 ml  Output 1050 ml  Net 1837.93 ml   Last 3 Weights 02/16/2021 02/15/2021 12/19/2020  Weight (lbs) 182 lb 12.2 oz 176 lb 12.9 oz 177 lb 11.1 oz  Weight (kg) 82.9 kg 80.2 kg 80.6 kg      Telemetry  Overnight telemetry shows sinus rhythm in the 70s, paroxysmal A. fib noted, which I personally reviewed.    ECG  The most recent ECG shows sinus rhythm, first-degree AV block, LVH with repolarization abnormality, which I personally reviewed.   Physical Exam   Vitals:   02/16/21 0745 02/16/21 0800 02/16/21 0830 02/16/21 0900  BP: (!) 153/111 (!) 153/95  (!) 159/98  Pulse: 65 65 69 66  Resp: 10 (!) 0 16 (!) 9  Temp:      TempSrc:      SpO2: 99% 95% 99% 97%  Weight:        Intake/Output Summary (Last 24 hours) at 02/16/2021 1030 Last data filed at 02/16/2021 0800 Gross per 24 hour  Intake 2887.93 ml  Output 1050 ml  Net 1837.93 ml    Last 3 Weights 02/16/2021 02/15/2021 12/19/2020  Weight (lbs) 182 lb 12.2 oz 176 lb 12.9 oz 177 lb 11.1 oz  Weight (kg) 82.9 kg 80.2 kg 80.6 kg    Body mass index is 24.79 kg/m.   General: Well nourished, well developed, in no acute distress Head: Atraumatic, normal size  Eyes: PEERLA, EOMI  Neck: Supple, no JVD Endocrine: No thryomegaly Cardiac: Normal S1, S2; RRR; no murmurs, rubs, or gallops Lungs: Diminished breath sounds bilaterally Abd: Soft, nontender, no hepatomegaly  Ext: No edema, pulses 2+ Musculoskeletal: No deformities,  BUE and BLE strength normal and equal Skin: Warm and dry, no rashes   Neuro: Alert and oriented to person, place, time, and situation, CNII-XII grossly intact, no focal deficits  Psych: Normal mood and affect   Labs  High Sensitivity Troponin:   Recent Labs  Lab 02/14/21 1954 02/14/21 2216  TROPONINIHS 99* 82*     Cardiac EnzymesNo results for input(s): TROPONINI in the last 168 hours. No results for input(s): TROPIPOC in the last 168 hours.  Chemistry Recent Labs  Lab 02/14/21 1611 02/14/21 2102 02/15/21 0618 02/16/21 0154  NA 135 135 135 136  K 4.2 4.1 3.5 3.0*  CL 101  --  99 96*  CO2 13*  --  16* 19*  GLUCOSE 89  --  129* 139*  BUN 180*  --  177* 177*  CREATININE 27.29*  --  27.50* 26.48*  CALCIUM 6.5*  --  6.5* 6.0*  PROT 6.9  --   --   --   ALBUMIN 3.6  --   --   --   AST 36  --   --   --   ALT  29  --   --   --   ALKPHOS 79  --   --   --   BILITOT 0.7  --   --   --   GFRNONAA 2*  --  NOT CALCULATED 2*  ANIONGAP 21*  --  20* 21*    Hematology Recent Labs  Lab 02/14/21 1611 02/14/21 2102 02/15/21 0618 02/16/21 0154  WBC 3.2*  --  3.3* 3.6*  RBC 2.92*  --  2.69* 2.63*  HGB 7.8* 8.5* 7.2* 7.0*  HCT 23.6* 25.0* 21.4* 20.4*  MCV 80.8  --  79.6* 77.6*  MCH 26.7  --  26.8 26.6  MCHC 33.1  --  33.6 34.3  RDW 15.7*  --  15.6* 15.1  PLT 82*  --  72* 68*   BNPNo results for input(s): BNP, PROBNP in the last 168 hours.  DDimer No results for input(s): DDIMER in the last 168 hours.   Radiology  DG Chest 2 View  Result Date: 02/14/2021 CLINICAL DATA:  Shortness of breath EXAM: CHEST - 2 VIEW COMPARISON:  Chest x-ray dated November 18, 2020 FINDINGS: Inferior cardiophrenic angles are excluded from the field-of-view on AP image. Unchanged cardiomegaly and tortuosity of the thoracic aorta. Lungs are clear. No evidence of pleural effusion or pneumothorax. IMPRESSION: No active cardiopulmonary disease. Electronically Signed   By: Yetta Glassman M.D.   On: 02/14/2021 17:03   IR Fluoro Guide CV Line Right  Result Date: 02/15/2021 INDICATION: ESRD requiring HD. EXAM: TUNNELED CENTRAL VENOUS HEMODIALYSIS CATHETER PLACEMENT WITH ULTRASOUND AND FLUOROSCOPIC GUIDANCE MEDICATIONS: None. ANESTHESIA/SEDATION: Moderate (conscious) sedation was employed during this procedure. A total of Versed 1 mg and Fentanyl 50 mcg was administered intravenously. Moderate Sedation Time: 22 minutes. The patient's level of consciousness and vital signs were monitored continuously by radiology nursing throughout the procedure under my direct supervision. FLUOROSCOPY TIME:  0 minutes 18 seconds (1 mGy). COMPLICATIONS: None immediate. PROCEDURE: Informed written consent was obtained from the patient after a discussion of the risks, benefits, and alternatives to treatment. Questions regarding the procedure were encouraged and  answered. The right neck and chest were prepped with chlorhexidine in a sterile fashion, and a sterile drape was applied covering the operative field. Maximum barrier sterile technique with sterile gowns and gloves were used for the procedure. A timeout was performed prior to the initiation  of the procedure. After creating a small venotomy incision, a micropuncture kit was utilized to access the internal jugular vein. Real-time ultrasound guidance was utilized for vascular access including the acquisition of a permanent ultrasound image documenting patency of the accessed vessel. The microwire was utilized to measure appropriate catheter length. A stiff Glidewire was advanced to the level of the IVC and the micropuncture sheath was exchanged for a peel-away sheath. A tunneled hemodialysis catheter measuring 23 cm from tip to cuff was tunneled in a retrograde fashion from the anterior chest wall to the venotomy incision. The catheter was then placed through the peel-away sheath with tips ultimately positioned within the superior aspect of the right atrium. Final catheter positioning was confirmed and documented with a spot radiographic image. The catheter aspirates and flushes normally. The catheter was flushed with appropriate volume heparin dwells. The catheter exit site was secured with a 2-0 nylon retention suture. The venotomy incision was closed with Dermabond. Dressings were applied. The patient tolerated the procedure well without immediate post procedural complication. IMPRESSION: Successful placement of 23 cm tip to cuff tunneled hemodialysis catheter via the right internal jugular vein with tips terminating within the superior aspect of the right atrium. The catheter is ready for immediate use. Michaelle Birks, MD [facility] Radiology Electronically Signed   By: Michaelle Birks M.D.   On: 02/15/2021 13:46   IR US Guide Vasc Access Right  Result Date:  02/15/2021 INDICATION: ESRD requiring HD. EXAM: TUNNELED CENTRAL VENOUS HEMODIALYSIS CATHETER PLACEMENT WITH ULTRASOUND AND FLUOROSCOPIC GUIDANCE MEDICATIONS: None. ANESTHESIA/SEDATION: Moderate (conscious) sedation was employed during this procedure. A total of Versed 1 mg and Fentanyl 50 mcg was administered intravenously. Moderate Sedation Time: 22 minutes. The patient's level of consciousness and vital signs were monitored continuously by radiology nursing throughout the procedure under my direct supervision. FLUOROSCOPY TIME:  0 minutes 18 seconds (1 mGy). COMPLICATIONS: None immediate. PROCEDURE: Informed written consent was obtained from the patient after a discussion of the risks, benefits, and alternatives to treatment. Questions regarding the procedure were encouraged and answered. The right neck and chest were prepped with chlorhexidine in a sterile fashion, and a sterile drape was applied covering the operative field. Maximum barrier sterile technique with sterile gowns and gloves were used for the procedure. A timeout was performed prior to the initiation of the procedure. After creating a small venotomy incision, a micropuncture kit was utilized to access the internal jugular vein. Real-time ultrasound guidance was utilized for vascular access including the acquisition of a permanent ultrasound image documenting patency of the accessed vessel. The microwire was utilized to measure appropriate catheter length. A stiff Glidewire was advanced to the level of the IVC and the micropuncture sheath was exchanged for a peel-away sheath. A tunneled hemodialysis catheter measuring 23 cm from tip to cuff was tunneled in a retrograde fashion from the anterior chest wall to the venotomy incision. The catheter was then placed through the peel-away sheath with tips ultimately positioned within the superior aspect of the right atrium. Final catheter positioning was confirmed and documented with a spot radiographic  image. The catheter aspirates and flushes normally. The catheter was flushed with appropriate volume heparin dwells. The catheter exit site was secured with a 2-0 nylon retention suture. The venotomy incision was closed with Dermabond. Dressings were applied. The patient tolerated the procedure well without immediate post procedural complication. IMPRESSION: Successful placement of 23 cm tip to cuff tunneled hemodialysis catheter via the right internal jugular  vein with tips terminating within the superior aspect of the right atrium. The catheter is ready for immediate use. Michaelle Birks, MD [facility] Radiology Electronically Signed   By: Michaelle Birks M.D.   On: 02/15/2021 13:46   DG Chest Port 1 View  Result Date: 02/14/2021 CLINICAL DATA:  Chest pain EXAM: PORTABLE CHEST 1 VIEW COMPARISON:  02/14/2021 FINDINGS: Mild cardiomegaly. No focal opacity or pleural effusion. Aortic atherosclerosis. No pneumothorax IMPRESSION: No active disease.  Mild cardiomegaly Electronically Signed   By: Donavan Foil M.D.   On: 02/14/2021 23:19   ECHOCARDIOGRAM COMPLETE  Result Date: 02/15/2021    ECHOCARDIOGRAM REPORT   Patient Name:   Antonio Keller Date of Exam: 02/15/2021 Medical Rec #:  [id number]     Height:       72.0 in Accession #:    [id number]    Weight:       176.8 lb Date of Birth:  June 03, 1960     BSA:          2.022 m Patient Age:    44 years      BP:           141/99 mmHg Patient Gender: M             HR:           57 bpm. Exam Location:  Inpatient Procedure: 2D Echo, 3D Echo, Cardiac Doppler and Color Doppler Indications:    Atrial fibrillation  History:        Patient has no prior history of Echocardiogram examinations.                 Risk Factors:Hypertension. ESRD.  Sonographer:    Clayton Lefort RDCS (AE) Referring Phys: [id number] [place]  1. Left ventricular ejection fraction, by estimation, is 40 to 45%. The left ventricle has mildly decreased  function. The left ventricle has no regional wall motion abnormalities. There is severe concentric left ventricular hypertrophy. Left ventricular  diastolic parameters are consistent with Grade II diastolic dysfunction (pseudonormalization). Elevated left ventricular end-diastolic pressure.  2. Right ventricular systolic function is normal. The right ventricular size is normal. Tricuspid regurgitation signal is inadequate for assessing PA pressure.  3. Left atrial size was severely dilated.  4. Right atrial size was mildly dilated.  5. The mitral valve is grossly normal. Mild mitral valve regurgitation.  6. The aortic valve is tricuspid. There is mild thickening of the aortic valve. Aortic valve regurgitation is trivial. No aortic stenosis is present. Aortic valve mean gradient measures 6.0 mmHg.  7. Aortic dilatation noted. There is mild dilatation of the aortic root, measuring 41 mm.  8. The inferior vena cava is normal in size with <50% respiratory variability, suggesting right atrial pressure of 8 mmHg. Comparison(s): No prior Echocardiogram. FINDINGS  Left Ventricle: Left ventricular ejection fraction, by estimation, is 40 to 45%. The left ventricle has mildly decreased function. The left ventricle has no regional wall motion abnormalities. The left ventricular internal cavity size was normal in size. There is severe concentric left ventricular hypertrophy. Left ventricular diastolic parameters are consistent with Grade II diastolic dysfunction (pseudonormalization). Elevated left ventricular end-diastolic pressure. Right Ventricle: The right ventricular size is normal. No increase in right ventricular wall thickness. Right ventricular systolic function is normal. Tricuspid regurgitation signal is inadequate for assessing PA pressure. Left Atrium: Left atrial size was severely dilated. Right Atrium: Right atrial size was mildly dilated. Pericardium: There is no evidence  of pericardial effusion. Mitral Valve:  The mitral valve is grossly normal. Mild mitral valve regurgitation. MV peak gradient, 6.6 mmHg. The mean mitral valve gradient is 2.0 mmHg. Tricuspid Valve: The tricuspid valve is grossly normal. Tricuspid valve regurgitation is trivial. Aortic Valve: The aortic valve is tricuspid. There is mild thickening of the aortic valve. There is mild aortic valve annular calcification. Aortic valve regurgitation is trivial. No aortic stenosis is present. Aortic valve mean gradient measures 6.0 mmHg. Aortic valve peak gradient measures 9.0 mmHg. Aortic valve area, by VTI measures 2.31 cm. Pulmonic Valve: The pulmonic valve was grossly normal. Pulmonic valve regurgitation is trivial. Aorta: Aortic dilatation noted. There is mild dilatation of the aortic root, measuring 41 mm. Venous: The inferior vena cava is normal in size with less than 50% respiratory variability, suggesting right atrial pressure of 8 mmHg. IAS/Shunts: No atrial level shunt detected by color flow Doppler.  LEFT VENTRICLE PLAX 2D LVIDd:         5.60 cm      Diastology LVIDs:         4.30 cm      LV e' medial:    4.95 cm/s LV PW:         2.00 cm      LV E/e' medial:  23.6 LV IVS:        2.00 cm      LV e' lateral:   10.10 cm/s LVOT diam:     2.30 cm      LV E/e' lateral: 11.6 LV SV:         81 LV SV Index:   40 LVOT Area:     4.15 cm                              3D Volume EF: LV Volumes (MOD)            3D EF:        45 % LV vol d, MOD A2C: 190.0 ml LV EDV:       232 ml LV vol d, MOD A4C: 166.0 ml LV ESV:       127 ml LV vol s, MOD A2C: 101.0 ml LV SV:        105 ml LV vol s, MOD A4C: 93.5 ml LV SV MOD A2C:     89.0 ml LV SV MOD A4C:     166.0 ml LV SV MOD BP:      88.2 ml RIGHT VENTRICLE             IVC RV Basal diam:  4.00 cm     IVC diam: 2.00 cm RV Mid diam:    2.20 cm RV S prime:     11.90 cm/s TAPSE (M-mode): 3.0 cm LEFT ATRIUM              Index       RIGHT ATRIUM           Index LA diam:        4.30 cm  2.13 cm/m  RA Area:     22.30 cm LA Vol  (A2C):   116.0 ml 57.37 ml/m RA Volume:   70.00 ml  34.62 ml/m LA Vol (A4C):   115.0 ml 56.88 ml/m LA Biplane Vol: 120.0 ml 59.35 ml/m  AORTIC VALVE AV Area (Vmax):    2.39 cm AV Area (Vmean):   2.21 cm AV Area (VTI):  2.31 cm AV Vmax:           150.00 cm/s AV Vmean:          115.000 cm/s AV VTI:            0.350 m AV Peak Grad:      9.0 mmHg AV Mean Grad:      6.0 mmHg LVOT Vmax:         86.20 cm/s LVOT Vmean:        61.100 cm/s LVOT VTI:          0.195 m LVOT/AV VTI ratio: 0.56  AORTA Ao Root diam: 4.10 cm Ao Asc diam:  3.70 cm MITRAL VALVE MV Area (PHT): 2.38 cm     SHUNTS MV Area VTI:   2.19 cm     Systemic VTI:  0.20 m MV Peak grad:  6.6 mmHg     Systemic Diam: 2.30 cm MV Mean grad:  2.0 mmHg MV Vmax:       1.28 m/s MV Vmean:      64.6 cm/s MV Decel Time: 319 msec MR Peak grad: 104.9 mmHg MR Mean grad: 68.0 mmHg MR Vmax:      512.00 cm/s MR Vmean:     387.0 cm/s MV E velocity: 117.00 cm/s MV A velocity: 58.70 cm/s MV E/A ratio:  1.99 Rozann Lesches MD Electronically signed by Rozann Lesches MD Signature Date/Time: 02/15/2021/3:01:03 PM    Final    US Abdomen Limited RUQ (LIVER/GB)  Result Date: 02/14/2021 CLINICAL DATA:  Epigastric pain EXAM: ULTRASOUND ABDOMEN LIMITED RIGHT UPPER QUADRANT COMPARISON:  Ultrasound 12/18/2020 FINDINGS: Gallbladder: No shadowing stones. Possible mild focal wall thickening up to 3.5 mm. Negative sonographic Murphy. Trace pericholecystic fluid Common bile duct: Diameter: 4.5 mm Liver: No focal lesion identified. Within normal limits in parenchymal echogenicity. Portal vein is patent on color Doppler imaging with normal direction of blood flow towards the liver. Other: Echogenic right kidney consistent with medical renal disease. IMPRESSION: 1. Negative for gallstones. There may be mild focal wall thickening but there is a negative sonographic Murphy. Trace fluid adjacent to gallbladder. Findings not strongly suggestive of acute gallbladder disease; if continued  concern, nuclear medicine hepatobiliary imaging may be obtained. 2. Echogenic right kidney consistent with medical renal disease Electronically Signed   By: Donavan Foil M.D.   On: 02/14/2021 23:57    Cardiac Studies  TTE 02/15/2021  1. Left ventricular ejection fraction, by estimation, is 40 to 45%. The  left ventricle has mildly decreased function. The left ventricle has no  regional wall motion abnormalities. There is severe concentric left  ventricular hypertrophy. Left ventricular   diastolic parameters are consistent with Grade II diastolic dysfunction  (pseudonormalization). Elevated left ventricular end-diastolic pressure.   2. Right ventricular systolic function is normal. The right ventricular  size is normal. Tricuspid regurgitation signal is inadequate for assessing  PA pressure.   3. Left atrial size was severely dilated.   4. Right atrial size was mildly dilated.   5. The mitral valve is grossly normal. Mild mitral valve regurgitation.   6. The aortic valve is tricuspid. There is mild thickening of the aortic  valve. Aortic valve regurgitation is trivial. No aortic stenosis is  present. Aortic valve mean gradient measures 6.0 mmHg.   7. Aortic dilatation noted. There is mild dilatation of the aortic root,  measuring 41 mm.   8. The inferior vena cava is normal in size with <50% respiratory  variability, suggesting right atrial  pressure of 8 mmHg.   Patient Profile  Antonio Keller is a 60 y.o. male with hypertension, CKD stage V who was admitted on 02/15/2021 with nausea vomiting with progression of kidney disease requiring initiation of hemodialysis.  Cardiology was consulted for atrial fibrillation as well as systolic heart failure.  Assessment & Plan   #New onset atrial fibrillation -CHADSVASC=2 (HTN, CHF).  -Reviewed EKG from the emergency room.  This is not ventricular tachycardia.  This is A. fib with aberrant conduction.  Suspect his critical illness drove his atrial  fibrillation.  He is converted back to sinus rhythm but having paroxysms of A. fib.  I think the best strategy would be short course of amiodarone.  We will start 400 mg twice daily for 7 days and then 200 mg daily for 21 days.  This will get him through starting dialysis.  I suspect his A. fib is just been driven by metabolic derangements as well as acidosis. -He does merit anticoagulation with a new diagnosis of systolic heart failure.  He is anemic but critical care medicine believes this is chronic.  Platelets are low however also believed to be chronic.  We will start him on a heparin drip.  If hemoglobin trends down we will stop this.  As long as platelets are above 50,000 I see no contraindication for heparin. -Would continue beta-blocker as well  #New onset systolic heart failure, EF 40-45% -Severe concentric LVH.  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  Continue with dialysis for volume removal. -He is on Coreg 25 mg twice daily. -Not a candidate for ACE/ARB/Arni/MRA given ESRD. -He is on hydralazine 100 mg 3 times daily.  I have added Imdur 30 mg daily.  We can titrate up for better BP control.  Suspect this will improve with dialysis. -Not a candidate for SGLT2 inhibitor currently.  #QTc prolongation -Not an issue.  He has LVH with repolarization abnormality.  #Elevated troponin -Demand ischemia.  I do not suspect an acute coronary syndrome.  Suspect this was minimally elevated in the setting of severe metabolic acidosis as well as hypertensive heart disease. -Can pursue outpatient cardiac stress test but I would not recommend this at this time. -Okay to continue aspirin 81 mg daily.  If we run into issues with low platelets or bleeding given anemia that should be stopped.  I have added a statin.  #Anion gap metabolic acidosis #CKD stage V now hemodialysis -Corrected with hemodialysis.  Per nephrology.  For questions or updates, please contact [place] Please consult  [email or web address] for contact info under   Time Spent with Patient: I have spent a total of 35 minutes with patient reviewing hospital notes, telemetry, EKGs, labs and examining the patient as well as establishing an assessment and plan that was discussed with the patient.  > 50% of time was spent in direct patient care.    Signed, Addison Naegeli. Audie Box, MD, [place]  02/16/2021 10:30 AM

## 2021-02-16 NOTE — Progress Notes (Signed)
[place] Progress Note Patient Name: Antonio Keller DOB: 1960/12/07 MRN: [id number]   Date of Service  02/16/2021  HPI/Events of Note  K at 3, mag 1.9 Hg 7. Calcium low.  Discussed with RN. Going for HD after 5 AM. Has prolonged qtc/a fib No active bleeding.  A: Hypokalemia. Metabolic acidosis. . First time going on HD. Hypocalcemia, anemia-of CKD.     eICU Interventions  - Kcl 10 meq q1 hr x 2 doses for now. PRBC, calcium and K replacement to decide by Nephrology team during HD in AM.      Intervention Category Intermediate Interventions: Electrolyte abnormality - evaluation and management;Other:  Elmer Sow 02/16/2021, 3:10 AM

## 2021-02-16 NOTE — Progress Notes (Signed)
[place] for heparin Indication: atrial fibrillation  No Known Allergies  Patient Measurements: Weight: 82.9 kg (182 lb 12.2 oz) Heparin Dosing Weight: 82 kg  Vital Signs: Temp: 97.9 F (36.6 C) (09/18 1600) Temp Source: Oral (09/18 1600) BP: 140/128 (09/18 2225) Pulse Rate: 67 (09/18 1800)  Labs: Recent Labs    02/14/21 1611 02/14/21 1954 02/14/21 2102 02/14/21 2216 02/15/21 0618 02/16/21 0154 02/16/21 2214  HGB 7.8*  --  8.5*  --  7.2* 7.0*  --   HCT 23.6*  --  25.0*  --  21.4* 20.4*  --   PLT 82*  --   --   --  72* 68*  --   LABPROT  --  16.3*  --   --   --   --   --   INR  --  1.3*  --   --   --   --   --   HEPARINUNFRC  --   --   --   --   --   --  <0.10*  CREATININE 27.29*  --   --   --  27.50* 26.48*  --   TROPONINIHS  --  99*  --  82*  --   --   --      Estimated Creatinine Clearance: 3.3 mL/min (A) (by C-G formula based on SCr of 26.48 mg/dL (H)).  Assessment: 60 yo f now with new onset afib. Heparin started. Plt 68, per cards ok to continue heparin as long as plt > 50. Hgb down to 7.   Heparin level undetectable on gtt at 1100 units/hr. Heparin has been turned off since 1600 due to HD tunneled cath site bleeding  Required thrombipad and multiple dressing changes - still with oozing.   Goal of Therapy:  Heparin level 0.3-0.7 units/ml Monitor platelets by anticoagulation protocol: Yes   Plan:  F/u [facility] plans  Sherlon Handing, PharmD, BCPS Please see amion for complete clinical pharmacist phone list 02/16/2021 10:49 PM

## 2021-02-17 DIAGNOSIS — I5021 Acute systolic (congestive) heart failure: Secondary | ICD-10-CM

## 2021-02-17 DIAGNOSIS — D649 Anemia, unspecified: Secondary | ICD-10-CM

## 2021-02-17 DIAGNOSIS — I248 Other forms of acute ischemic heart disease: Secondary | ICD-10-CM | POA: Diagnosis not present

## 2021-02-17 DIAGNOSIS — I5020 Unspecified systolic (congestive) heart failure: Secondary | ICD-10-CM

## 2021-02-17 DIAGNOSIS — R9431 Abnormal electrocardiogram [ECG] [EKG]: Secondary | ICD-10-CM

## 2021-02-17 DIAGNOSIS — D696 Thrombocytopenia, unspecified: Secondary | ICD-10-CM

## 2021-02-17 DIAGNOSIS — I132 Hypertensive heart and chronic kidney disease with heart failure and with stage 5 chronic kidney disease, or end stage renal disease: Secondary | ICD-10-CM

## 2021-02-17 DIAGNOSIS — I480 Paroxysmal atrial fibrillation: Secondary | ICD-10-CM | POA: Diagnosis not present

## 2021-02-17 DIAGNOSIS — N186 End stage renal disease: Secondary | ICD-10-CM | POA: Diagnosis present

## 2021-02-17 LAB — COMPREHENSIVE METABOLIC PANEL
ALT: 41 U/L (ref 0–44)
AST: 53 U/L — ABNORMAL HIGH (ref 15–41)
Albumin: 3 g/dL — ABNORMAL LOW (ref 3.5–5.0)
Alkaline Phosphatase: 80 U/L (ref 38–126)
Anion gap: 13 (ref 5–15)
BUN: 92 mg/dL — ABNORMAL HIGH (ref 6–20)
CO2: 23 mmol/L (ref 22–32)
Calcium: 7.4 mg/dL — ABNORMAL LOW (ref 8.9–10.3)
Chloride: 99 mmol/L (ref 98–111)
Creatinine, Ser: 16.65 mg/dL — ABNORMAL HIGH (ref 0.61–1.24)
GFR, Estimated: 3 mL/min — ABNORMAL LOW (ref >60)
Glucose, Bld: 117 mg/dL — ABNORMAL HIGH (ref 70–99)
Potassium: 3 mmol/L — ABNORMAL LOW (ref 3.5–5.1)
Sodium: 135 mmol/L (ref 135–145)
Total Bilirubin: 0.5 mg/dL (ref 0.3–1.2)
Total Protein: 6 g/dL — ABNORMAL LOW (ref 6.5–8.1)

## 2021-02-17 LAB — BASIC METABOLIC PANEL
Anion gap: 18 — ABNORMAL HIGH (ref 5–15)
BUN: 126 mg/dL — ABNORMAL HIGH (ref 6–20)
CO2: 22 mmol/L (ref 22–32)
Calcium: 6.6 mg/dL — ABNORMAL LOW (ref 8.9–10.3)
Chloride: 94 mmol/L — ABNORMAL LOW (ref 98–111)
Creatinine, Ser: 20.26 mg/dL — ABNORMAL HIGH (ref 0.61–1.24)
GFR, Estimated: 2 mL/min — ABNORMAL LOW (ref >60)
Glucose, Bld: 102 mg/dL — ABNORMAL HIGH (ref 70–99)
Potassium: 3 mmol/L — ABNORMAL LOW (ref 3.5–5.1)
Sodium: 134 mmol/L — ABNORMAL LOW (ref 135–145)

## 2021-02-17 LAB — MAGNESIUM
Magnesium: 1.7 mg/dL (ref 1.7–2.4)
Magnesium: 1.7 mg/dL (ref 1.7–2.4)

## 2021-02-17 LAB — GLUCOSE, CAPILLARY
Glucose-Capillary: 100 mg/dL — ABNORMAL HIGH (ref 70–99)
Glucose-Capillary: 141 mg/dL — ABNORMAL HIGH (ref 70–99)

## 2021-02-17 LAB — PHOSPHORUS: Phosphorus: 6.5 mg/dL — ABNORMAL HIGH (ref 2.5–4.6)

## 2021-02-17 LAB — HEPATITIS B SURFACE ANTIGEN: Hepatitis B Surface Ag: REACTIVE — AB

## 2021-02-17 MED ORDER — HEPARIN SODIUM (PORCINE) 1000 UNIT/ML IJ SOLN
INTRAMUSCULAR | Status: AC
  Administered 2021-02-17: 1000 [IU]
  Filled 2021-02-17: qty 4

## 2021-02-17 NOTE — Progress Notes (Signed)
Cardiology Progress Note  Patient ID: Antonio Keller MRN: [id number] DOB: [birth year] Date of Encounter: 02/17/2021  Primary Cardiologist: None  Subjective    Denies any chest pain or dyspnea  Inpatient Medications  Scheduled Meds:  amiodarone  400 mg Oral BID   Followed by   Derrill Memo ON 02/23/2021] amiodarone  200 mg Oral Daily   aspirin  81 mg Oral Daily   atorvastatin  40 mg Oral Daily   calcium acetate  1,334 mg Oral TID WC   calcium carbonate  1 tablet Oral TID WC   carvedilol  25 mg Oral BID WC   Chlorhexidine Gluconate Cloth  6 each Topical Daily   Chlorhexidine Gluconate Cloth  6 each Topical Q0600   enoxaparin (LOVENOX) injection  30 mg Subcutaneous Q24H   hydrALAZINE  100 mg Oral Q12H   isosorbide mononitrate  30 mg Oral Daily   sodium chloride flush  10-40 mL Intracatheter Q12H   Continuous Infusions:  amiodarone Stopped (02/14/21 2137)   PRN Meds: alteplase, docusate sodium, heparin, heparin sodium (porcine), lidocaine, polyethylene glycol, sodium chloride flush   Vital Signs   Vitals:   02/17/21 0700 02/17/21 0708 02/17/21 0800 02/17/21 0823  BP: (!) 148/128  134/84 134/84  Pulse: 65  63 69  Resp: (!) 0  (!) 9   Temp:  98.3 F (36.8 C)    TempSrc:  Oral    SpO2: 98%  99%   Weight:        Intake/Output Summary (Last 24 hours) at 02/17/2021 0931 Last data filed at 02/17/2021 0800 Gross per 24 hour  Intake 52.01 ml  Output 650 ml  Net -597.99 ml    Last 3 Weights 02/17/2021 02/16/2021 02/15/2021  Weight (lbs) 184 lb 11.9 oz 182 lb 12.2 oz 176 lb 12.9 oz  Weight (kg) 83.8 kg 82.9 kg 80.2 kg      Telemetry  NSR in 60s, no Afib, which I personally reviewed.   ECG  No new ECG, which I personally reviewed.   Physical Exam   Vitals:   02/17/21 0700 02/17/21 0708 02/17/21 0800 02/17/21 0823  BP: (!) 148/128  134/84 134/84  Pulse: 65  63 69  Resp: (!) 0  (!) 9   Temp:  98.3 F (36.8 C)    TempSrc:  Oral    SpO2: 98%  99%   Weight:         Intake/Output Summary (Last 24 hours) at 02/17/2021 0931 Last data filed at 02/17/2021 0800 Gross per 24 hour  Intake 52.01 ml  Output 650 ml  Net -597.99 ml     Last 3 Weights 02/17/2021 02/16/2021 02/15/2021  Weight (lbs) 184 lb 11.9 oz 182 lb 12.2 oz 176 lb 12.9 oz  Weight (kg) 83.8 kg 82.9 kg 80.2 kg    Body mass index is 25.06 kg/m.   General: Well nourished, well developed, in no acute distress Head: Atraumatic, normal size  Eyes: PEERLA, EOMI  Neck: Supple, no JVD Cardiac: Normal S1, S2; RRR; no murmurs, rubs, or gallops Lungs: CTAB Abd: Soft, nontender, no hepatomegaly  Ext: No edema Musculoskeletal: No deformities Skin: Warm and dry  Neuro: Alert and oriented to person, place, time, and situation Psych: Normal mood and affect   Labs  High Sensitivity Troponin:   Recent Labs  Lab 02/14/21 1954 02/14/21 2216  TROPONINIHS 99* 82*      Cardiac EnzymesNo results for input(s): TROPONINI in the last 168 hours. No results for input(s): TROPIPOC in the last  168 hours.  Chemistry Recent Labs  Lab 02/14/21 1611 02/14/21 2102 02/15/21 0618 02/16/21 0154 02/17/21 0008  NA 135   < > 135 136 134*  K 4.2   < > 3.5 3.0* 3.0*  CL 101  --  99 96* 94*  CO2 13*  --  16* 19* 22  GLUCOSE 89  --  129* 139* 102*  BUN 180*  --  177* 177* 126*  CREATININE 27.29*  --  27.50* 26.48* 20.26*  CALCIUM 6.5*  --  6.5* 6.0* 6.6*  PROT 6.9  --   --   --   --   ALBUMIN 3.6  --   --   --   --   AST 36  --   --   --   --   ALT 29  --   --   --   --   ALKPHOS 79  --   --   --   --   BILITOT 0.7  --   --   --   --   GFRNONAA 2*  --  NOT CALCULATED 2* 2*  ANIONGAP 21*  --  20* 21* 18*   < > = values in this interval not displayed.     Hematology Recent Labs  Lab 02/14/21 1611 02/14/21 2102 02/15/21 0618 02/16/21 0154  WBC 3.2*  --  3.3* 3.6*  RBC 2.92*  --  2.69* 2.63*  HGB 7.8* 8.5* 7.2* 7.0*  HCT 23.6* 25.0* 21.4* 20.4*  MCV 80.8  --  79.6* 77.6*  MCH 26.7  --  26.8 26.6   MCHC 33.1  --  33.6 34.3  RDW 15.7*  --  15.6* 15.1  PLT 82*  --  72* 68*    BNPNo results for input(s): BNP, PROBNP in the last 168 hours.  DDimer No results for input(s): DDIMER in the last 168 hours.   Radiology  IR Fluoro Guide CV Line Right  Result Date: 02/15/2021 INDICATION: ESRD requiring HD. EXAM: TUNNELED CENTRAL VENOUS HEMODIALYSIS CATHETER PLACEMENT WITH ULTRASOUND AND FLUOROSCOPIC GUIDANCE MEDICATIONS: None. ANESTHESIA/SEDATION: Moderate (conscious) sedation was employed during this procedure. A total of Versed 1 mg and Fentanyl 50 mcg was administered intravenously. Moderate Sedation Time: 22 minutes. The patient's level of consciousness and vital signs were monitored continuously by radiology nursing throughout the procedure under my direct supervision. FLUOROSCOPY TIME:  0 minutes 18 seconds (1 mGy). COMPLICATIONS: None immediate. PROCEDURE: Informed written consent was obtained from the patient after a discussion of the risks, benefits, and alternatives to treatment. Questions regarding the procedure were encouraged and answered. The right neck and chest were prepped with chlorhexidine in a sterile fashion, and a sterile drape was applied covering the operative field. Maximum barrier sterile technique with sterile gowns and gloves were used for the procedure. A timeout was performed prior to the initiation of the procedure. After creating a small venotomy incision, a micropuncture kit was utilized to access the internal jugular vein. Real-time ultrasound guidance was utilized for vascular access including the acquisition of a permanent ultrasound image documenting patency of the accessed vessel. The microwire was utilized to measure appropriate catheter length. A stiff Glidewire was advanced to the level of the IVC and the micropuncture sheath was exchanged for a peel-away sheath. A tunneled hemodialysis catheter measuring 23 cm from tip to cuff was tunneled in a retrograde fashion from  the anterior chest wall to the venotomy incision. The catheter was then placed through the peel-away sheath with tips ultimately  positioned within the superior aspect of the right atrium. Final catheter positioning was confirmed and documented with a spot radiographic image. The catheter aspirates and flushes normally. The catheter was flushed with appropriate volume heparin dwells. The catheter exit site was secured with a 2-0 nylon retention suture. The venotomy incision was closed with Dermabond. Dressings were applied. The patient tolerated the procedure well without immediate post procedural complication. IMPRESSION: Successful placement of 23 cm tip to cuff tunneled hemodialysis catheter via the right internal jugular vein with tips terminating within the superior aspect of the right atrium. The catheter is ready for immediate use. Michaelle Birks, MD [facility] Radiology Electronically Signed   By: Michaelle Birks M.D.   On: 02/15/2021 13:46   IR US Guide Vasc Access Right  Result Date: 02/15/2021 INDICATION: ESRD requiring HD. EXAM: TUNNELED CENTRAL VENOUS HEMODIALYSIS CATHETER PLACEMENT WITH ULTRASOUND AND FLUOROSCOPIC GUIDANCE MEDICATIONS: None. ANESTHESIA/SEDATION: Moderate (conscious) sedation was employed during this procedure. A total of Versed 1 mg and Fentanyl 50 mcg was administered intravenously. Moderate Sedation Time: 22 minutes. The patient's level of consciousness and vital signs were monitored continuously by radiology nursing throughout the procedure under my direct supervision. FLUOROSCOPY TIME:  0 minutes 18 seconds (1 mGy). COMPLICATIONS: None immediate. PROCEDURE: Informed written consent was obtained from the patient after a discussion of the risks, benefits, and alternatives to treatment. Questions regarding the procedure were encouraged and answered. The right neck and chest were prepped with chlorhexidine in a sterile fashion, and a  sterile drape was applied covering the operative field. Maximum barrier sterile technique with sterile gowns and gloves were used for the procedure. A timeout was performed prior to the initiation of the procedure. After creating a small venotomy incision, a micropuncture kit was utilized to access the internal jugular vein. Real-time ultrasound guidance was utilized for vascular access including the acquisition of a permanent ultrasound image documenting patency of the accessed vessel. The microwire was utilized to measure appropriate catheter length. A stiff Glidewire was advanced to the level of the IVC and the micropuncture sheath was exchanged for a peel-away sheath. A tunneled hemodialysis catheter measuring 23 cm from tip to cuff was tunneled in a retrograde fashion from the anterior chest wall to the venotomy incision. The catheter was then placed through the peel-away sheath with tips ultimately positioned within the superior aspect of the right atrium. Final catheter positioning was confirmed and documented with a spot radiographic image. The catheter aspirates and flushes normally. The catheter was flushed with appropriate volume heparin dwells. The catheter exit site was secured with a 2-0 nylon retention suture. The venotomy incision was closed with Dermabond. Dressings were applied. The patient tolerated the procedure well without immediate post procedural complication. IMPRESSION: Successful placement of 23 cm tip to cuff tunneled hemodialysis catheter via the right internal jugular vein with tips terminating within the superior aspect of the right atrium. The catheter is ready for immediate use. Michaelle Birks, MD [facility] Radiology Electronically Signed   By: Michaelle Birks M.D.   On: 02/15/2021 13:46   ECHOCARDIOGRAM COMPLETE  Result Date: 02/15/2021    ECHOCARDIOGRAM REPORT   Patient Name:   Antonio Keller Date of Exam: 02/15/2021 Medical Rec #:   [id number]     Height:       72.0 in Accession #:    [id number]    Weight:       176.8 lb Date of Birth:  [birth year]  BSA:          2.022 m Patient Age:    60 years      BP:           141/99 mmHg Patient Gender: M             HR:           57 bpm. Exam Location:  Inpatient Procedure: 2D Echo, 3D Echo, Cardiac Doppler and Color Doppler Indications:    Atrial fibrillation  History:        Patient has no prior history of Echocardiogram examinations.                 Risk Factors:Hypertension. ESRD.  Sonographer:    Clayton Lefort RDCS (AE) Referring Phys: [id number] [place]  1. Left ventricular ejection fraction, by estimation, is 40 to 45%. The left ventricle has mildly decreased function. The left ventricle has no regional wall motion abnormalities. There is severe concentric left ventricular hypertrophy. Left ventricular  diastolic parameters are consistent with Grade II diastolic dysfunction (pseudonormalization). Elevated left ventricular end-diastolic pressure.  2. Right ventricular systolic function is normal. The right ventricular size is normal. Tricuspid regurgitation signal is inadequate for assessing PA pressure.  3. Left atrial size was severely dilated.  4. Right atrial size was mildly dilated.  5. The mitral valve is grossly normal. Mild mitral valve regurgitation.  6. The aortic valve is tricuspid. There is mild thickening of the aortic valve. Aortic valve regurgitation is trivial. No aortic stenosis is present. Aortic valve mean gradient measures 6.0 mmHg.  7. Aortic dilatation noted. There is mild dilatation of the aortic root, measuring 41 mm.  8. The inferior vena cava is normal in size with <50% respiratory variability, suggesting right atrial pressure of 8 mmHg. Comparison(s): No prior Echocardiogram. FINDINGS  Left Ventricle: Left ventricular ejection fraction, by estimation, is 40 to 45%. The left ventricle has mildly decreased function. The left ventricle has no regional wall  motion abnormalities. The left ventricular internal cavity size was normal in size. There is severe concentric left ventricular hypertrophy. Left ventricular diastolic parameters are consistent with Grade II diastolic dysfunction (pseudonormalization). Elevated left ventricular end-diastolic pressure. Right Ventricle: The right ventricular size is normal. No increase in right ventricular wall thickness. Right ventricular systolic function is normal. Tricuspid regurgitation signal is inadequate for assessing PA pressure. Left Atrium: Left atrial size was severely dilated. Right Atrium: Right atrial size was mildly dilated. Pericardium: There is no evidence of pericardial effusion. Mitral Valve: The mitral valve is grossly normal. Mild mitral valve regurgitation. MV peak gradient, 6.6 mmHg. The mean mitral valve gradient is 2.0 mmHg. Tricuspid Valve: The tricuspid valve is grossly normal. Tricuspid valve regurgitation is trivial. Aortic Valve: The aortic valve is tricuspid. There is mild thickening of the aortic valve. There is mild aortic valve annular calcification. Aortic valve regurgitation is trivial. No aortic stenosis is present. Aortic valve mean gradient measures 6.0 mmHg. Aortic valve peak gradient measures 9.0 mmHg. Aortic valve area, by VTI measures 2.31 cm. Pulmonic Valve: The pulmonic valve was grossly normal. Pulmonic valve regurgitation is trivial. Aorta: Aortic dilatation noted. There is mild dilatation of the aortic root, measuring 41 mm. Venous: The inferior vena cava is normal in size with less than 50% respiratory variability, suggesting right atrial pressure of 8 mmHg. IAS/Shunts: No atrial level shunt detected by color flow Doppler.  LEFT VENTRICLE PLAX 2D LVIDd:         5.60 cm  Diastology LVIDs:         4.30 cm      LV e' medial:    4.95 cm/s LV PW:         2.00 cm      LV E/e' medial:  23.6 LV IVS:        2.00 cm      LV e' lateral:   10.10 cm/s LVOT diam:     2.30 cm      LV E/e'  lateral: 11.6 LV SV:         81 LV SV Index:   40 LVOT Area:     4.15 cm                              3D Volume EF: LV Volumes (MOD)            3D EF:        45 % LV vol d, MOD A2C: 190.0 ml LV EDV:       232 ml LV vol d, MOD A4C: 166.0 ml LV ESV:       127 ml LV vol s, MOD A2C: 101.0 ml LV SV:        105 ml LV vol s, MOD A4C: 93.5 ml LV SV MOD A2C:     89.0 ml LV SV MOD A4C:     166.0 ml LV SV MOD BP:      88.2 ml RIGHT VENTRICLE             IVC RV Basal diam:  4.00 cm     IVC diam: 2.00 cm RV Mid diam:    2.20 cm RV S prime:     11.90 cm/s TAPSE (M-mode): 3.0 cm LEFT ATRIUM              Index       RIGHT ATRIUM           Index LA diam:        4.30 cm  2.13 cm/m  RA Area:     22.30 cm LA Vol (A2C):   116.0 ml 57.37 ml/m RA Volume:   70.00 ml  34.62 ml/m LA Vol (A4C):   115.0 ml 56.88 ml/m LA Biplane Vol: 120.0 ml 59.35 ml/m  AORTIC VALVE AV Area (Vmax):    2.39 cm AV Area (Vmean):   2.21 cm AV Area (VTI):     2.31 cm AV Vmax:           150.00 cm/s AV Vmean:          115.000 cm/s AV VTI:            0.350 m AV Peak Grad:      9.0 mmHg AV Mean Grad:      6.0 mmHg LVOT Vmax:         86.20 cm/s LVOT Vmean:        61.100 cm/s LVOT VTI:          0.195 m LVOT/AV VTI ratio: 0.56  AORTA Ao Root diam: 4.10 cm Ao Asc diam:  3.70 cm MITRAL VALVE MV Area (PHT): 2.38 cm     SHUNTS MV Area VTI:   2.19 cm     Systemic VTI:  0.20 m MV Peak grad:  6.6 mmHg     Systemic Diam: 2.30 cm MV Mean grad:  2.0 mmHg MV Vmax:  1.28 m/s MV Vmean:      64.6 cm/s MV Decel Time: 319 msec MR Peak grad: 104.9 mmHg MR Mean grad: 68.0 mmHg MR Vmax:      512.00 cm/s MR Vmean:     387.0 cm/s MV E velocity: 117.00 cm/s MV A velocity: 58.70 cm/s MV E/A ratio:  1.99 Rozann Lesches MD Electronically signed by Rozann Lesches MD Signature Date/Time: 02/15/2021/3:01:03 PM    Final    VAS [place] UPPER EXT VEIN MAPPING (PRE-OP AVF)  Result Date: 02/16/2021 [place] MAPPING Patient Name:  Antonio Keller  Date of Exam:   02/16/2021  Medical Rec #: [id number]      Accession #:    [id number] Date of Birth: 1960-09-12      Patient Gender: M Patient Age:   17 years Exam Location:  [facility] Procedure:      VAS [place] UPPER EXT VEIN MAPPING (PRE-OP AVF) Referring Phys: JOSHUA ROBINS --------------------------------------------------------------------------------  Indications: Pre-access. Comparison Study: no prior Performing Technologist: Archie Patten RVS  Examination Guidelines: A complete evaluation includes B-mode imaging, spectral Doppler, color Doppler, and power Doppler as needed of all accessible portions of each vessel. Bilateral testing is considered an integral part of a complete examination. Limited examinations for reoccurring indications may be performed as noted. +-----------------+-------------+----------+---------+ Right Cephalic   Diameter (cm)Depth (cm)Findings  +-----------------+-------------+----------+---------+ Shoulder             0.13        0.24             +-----------------+-------------+----------+---------+ Prox upper arm       0.21        0.23             +-----------------+-------------+----------+---------+ Mid upper arm        0.22        0.35   branching +-----------------+-------------+----------+---------+ Dist upper arm       0.29        0.48             +-----------------+-------------+----------+---------+ Antecubital fossa    0.28        0.24             +-----------------+-------------+----------+---------+ Prox forearm         0.22        0.19             +-----------------+-------------+----------+---------+ Mid forearm          0.18        0.19             +-----------------+-------------+----------+---------+ Dist forearm         0.13        0.35             +-----------------+-------------+----------+---------+ Wrist                0.17        0.31             +-----------------+-------------+----------+---------+  +-----------------+-------------+----------+--------+ Right Basilic    Diameter (cm)Depth (cm)Findings +-----------------+-------------+----------+--------+ Prox upper arm       0.42        0.74            +-----------------+-------------+----------+--------+ Mid upper arm        0.32        0.72            +-----------------+-------------+----------+--------+ Dist upper arm       0.32  0.62            +-----------------+-------------+----------+--------+ Antecubital fossa    0.30        0.20            +-----------------+-------------+----------+--------+ Prox forearm         0.26        0.27            +-----------------+-------------+----------+--------+ Mid forearm          0.13        0.27            +-----------------+-------------+----------+--------+ Distal forearm       0.13        0.19            +-----------------+-------------+----------+--------+ +-----------------+-------------+----------+--------+ Left Cephalic    Diameter (cm)Depth (cm)Findings +-----------------+-------------+----------+--------+ Shoulder             0.31        0.27            +-----------------+-------------+----------+--------+ Prox upper arm       0.31        0.26            +-----------------+-------------+----------+--------+ Mid upper arm        0.24        0.26            +-----------------+-------------+----------+--------+ Dist upper arm       0.30        0.23            +-----------------+-------------+----------+--------+ Antecubital fossa    0.33        0.35            +-----------------+-------------+----------+--------+ Prox forearm         0.25        0.32            +-----------------+-------------+----------+--------+ Mid forearm          0.15        0.27            +-----------------+-------------+----------+--------+ Dist forearm         0.20        0.43            +-----------------+-------------+----------+--------+  Wrist                0.19        0.35            +-----------------+-------------+----------+--------+ +-----------------+-------------+----------+--------+ Left Basilic     Diameter (cm)Depth (cm)Findings +-----------------+-------------+----------+--------+ Prox upper arm       0.36        0.55            +-----------------+-------------+----------+--------+ Mid upper arm        0.36        0.52            +-----------------+-------------+----------+--------+ Dist upper arm       0.35        0.46            +-----------------+-------------+----------+--------+ Antecubital fossa    0.21        0.25            +-----------------+-------------+----------+--------+ Prox forearm         0.15        0.33            +-----------------+-------------+----------+--------+ Mid forearm          0.15  0.20            +-----------------+-------------+----------+--------+ Distal forearm       0.14        0.15            +-----------------+-------------+----------+--------+ *See table(s) above for measurements and observations.  Diagnosing physician:    Preliminary     Cardiac Studies  TTE 02/15/2021  1. Left ventricular ejection fraction, by estimation, is 40 to 45%. The  left ventricle has mildly decreased function. The left ventricle has no  regional wall motion abnormalities. There is severe concentric left  ventricular hypertrophy. Left ventricular   diastolic parameters are consistent with Grade II diastolic dysfunction  (pseudonormalization). Elevated left ventricular end-diastolic pressure.   2. Right ventricular systolic function is normal. The right ventricular  size is normal. Tricuspid regurgitation signal is inadequate for assessing  PA pressure.   3. Left atrial size was severely dilated.   4. Right atrial size was mildly dilated.   5. The mitral valve is grossly normal. Mild mitral valve regurgitation.   6. The aortic valve is tricuspid. There is  mild thickening of the aortic  valve. Aortic valve regurgitation is trivial. No aortic stenosis is  present. Aortic valve mean gradient measures 6.0 mmHg.   7. Aortic dilatation noted. There is mild dilatation of the aortic root,  measuring 41 mm.   8. The inferior vena cava is normal in size with <50% respiratory  variability, suggesting right atrial pressure of 8 mmHg.   Patient Profile  Antonio Keller is a 60 y.o. male with hypertension, CKD stage V who was admitted on 02/15/2021 with nausea vomiting with progression of kidney disease requiring initiation of hemodialysis.  Cardiology was consulted for atrial fibrillation as well as systolic heart failure.  Assessment & Plan   #New onset atrial fibrillation -CHADSVASC=2 (HTN, CHF).  -Suspect his critical illness drove his atrial fibrillation.  He is converted back to sinus rhythm but having paroxysms of A. fib.  Started on amiodarone with plan for 400 mg twice daily for 7 days and then 200 mg daily for 21 days.   -He does merit anticoagulation with a new diagnosis of systolic heart failure.  He is anemic but this was felt to be chronic.  Platelets are low however also believed to be chronic.  Would recommend heparin drip once OK to restart (held due to bleeding around tunneled catheter site).  If hemoglobin trends down we will stop this.  As long as platelets are above 50,000 and Hgb stable, no contraindication for heparin. -Continue coreg 25 mg BID  #New onset systolic heart failure, EF 40-45% -Severe concentric LVH.  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  Continue with dialysis for volume removal. -He is on Coreg 25 mg twice daily. -Not a candidate for ACE/ARB/Arni/MRA given ESRD. -He is on hydralazine 100 mg 3 times daily.  Have added Imdur 30 mg daily.  We can titrate up for better BP control.  Suspect this will improve with dialysis. -Not a candidate for SGLT2 inhibitor currently.  #QTc prolongation -He has LVH with  repolarization abnormality.  Check an EKG since starting amiodarone  #Elevated troponin -Suspect demand ischemia.  Do not suspect an acute coronary syndrome.  Suspect this was minimally elevated in the setting of severe metabolic acidosis as well as hypertensive heart disease. -Can pursue outpatient cardiac stress test  -Okay to continue aspirin 81 mg daily.  If we run into issues with low platelets or bleeding given anemia that  should be stopped.  Have added a statin.  #Anion gap metabolic acidosis #CKD stage V now hemodialysis -Corrected with hemodialysis.  Per nephrology.  For questions or updates, please contact [place] Please consult [email or web address] for contact info under    Donato Heinz, MD

## 2021-02-17 NOTE — TOC Initial Note (Addendum)
Transition of Care [facility]) - Initial/Assessment Note    Patient Details  Name: Antonio Keller MRN: [id number] Date of Birth: 1960-08-19  Transition of Care [facility]) CM/SW Contact:    Tom-Johnson, Renea Ee, RN Phone Number: 02/17/2021, 4:48 PM  Clinical Narrative:                 CM spoke with patient at bedside about needs for [facility] post hospital transition. Patient states he lives with his wife. Has two grown children and two grand children. Independent with ADL's prior to hospitalization. Drove self to and from appointments. Does not have any DME's as there was no need for it. Currently works for [employer]. Denies any financial assistance. Declined dialysis in the past and now agrees to be dialyzed. Renal navigator is working on dialysis clinic. No PT/OT orders noted. Wife to transport at discharge. CM will continue to follow with needs.   Barriers to Discharge: Continued Medical Work up   Patient Goals and CMS Choice Patient states their goals for this hospitalization and ongoing recovery are:: To go home   Choice offered to / list presented to : Patient  Expected Discharge Plan and Services         Living arrangements for the past 2 months: Apartment                                      Prior Living Arrangements/Services Living arrangements for the past 2 months: Apartment Lives with:: Spouse Patient language and need for interpreter reviewed:: Yes Do you feel safe going back to the place where you live?: Yes      Need for Family Participation in Patient Care: Yes (Comment) Care giver support system in place?: Yes (comment)   Criminal Activity/Legal Involvement Pertinent to Current Situation/Hospitalization: No - Comment as needed  Activities of Daily Living   ADL Screening (condition at time of admission) Patient's cognitive ability adequate to safely complete daily activities?: Yes Is the patient deaf or have difficulty hearing?: No Does the patient have  difficulty seeing, even when wearing glasses/contacts?: No Does the patient have difficulty concentrating, remembering, or making decisions?: No Patient able to express need for assistance with ADLs?: Yes Does the patient have difficulty dressing or bathing?: No Independently performs ADLs?: Yes (appropriate for developmental age) Does the patient have difficulty walking or climbing stairs?: No Weakness of Legs: None Weakness of Arms/Hands: None  Permission Sought/Granted Permission sought to share information with : Case Manager Permission granted to share information with : Yes, Verbal Permission Granted              Emotional Assessment Appearance:: Appears stated age Attitude/Demeanor/Rapport: Engaged Affect (typically observed): Accepting, Appropriate, Hopeful Orientation: : Oriented to Self, Oriented to Place, Oriented to  Time, Oriented to Situation Alcohol / Substance Use: Not Applicable    Admission diagnosis:  Nausea and vomiting [R11.2] Abdominal pain [R10.9] Patient Active Problem List   Diagnosis Date Noted   ESRD (end stage renal disease) ([place]) 02/17/2021   Nausea and vomiting 02/14/2021   Abdominal pain    New onset atrial fibrillation ([place])    CKD (chronic kidney disease), stage V ([place])    Essential hypertension 12/18/2020   PCP:  Jolinda Croak, MD Pharmacy:   [id number] [id number]: [phone]: [phone]    Social Determinants of Health (  SDOH) Interventions    Readmission Risk Interventions No flowsheet data found.

## 2021-02-17 NOTE — Progress Notes (Addendum)
Requested to see pt by CSW for HD arrangements. Unable to meet with pt this afternoon due to pt being off the unit. Will see pt later today if possible if not will speak with pt in the am.   Antonio Keller  Renal Navigator [phone]   Addendum at 4:27 pm: Met with pt and pt's wife at bedside. Discussed role. Pt and pt's wife prefer [facility] or [facility]. Wife is requesting 3rd shift appointment if possible. Advised pt and wife that I would investigate if that is an option at either clinic or other clinics. Referral made to [facility] admissions this afternoon. Will provide update to pt/pt's wife as information becomes available. Pt or pt's wife/support system to provide provide transport at HD appts at d/c.

## 2021-02-17 NOTE — Progress Notes (Addendum)
PROGRESS NOTE    Antonio Keller  LJQ:[id number] DOB: 09/08/1960 DOA: 02/14/2021 PCP: Jolinda Croak, MD   Brief Narrative:  Antonio Keller ,is a 60 y.o. male with past medical history of end-stage renal disease, not on dialysis, hypertension, who presented to the [facility] ED with a chief complaint of nausea, vomiting, and abdominal pain ongoing for 2 weeks, worse since 1 week.  Became increasingly fatigued so he called EMS on 02/14/2021.   ED course was notable for ABG of 7.22/30/114/12.5, Trop 99, MG 2.0, K 4.1, HGB 8.5, lipase 216. Lactate 0.7, AG 21, BUN 180 While in the ED he developed afib with RVR and was started on an amio gtt. he was admitted under ICU.  Neph and Cardiology were consulted by the ED.  Transferred under [place] on 02/17/2021.  Assessment & Plan:   Active Problems:   Essential hypertension   Nausea and vomiting   New onset atrial fibrillation (HCC)   ESRD (end stage renal disease) (HCC)  End-stage renal disease, newly started on hemodialysis/High anion gap metabolic acidosis: Nephrology following.  [place] placed on 02/15/2021 by IR.  Received first session of dialysis on 02/16/2021.  We have used consulted for permanent access however they prefer his platelets to get better before they do this.  Management per nephrology and we appreciate their help.  Paroxysmal new onset atrial fibrillation/Prolonged QT: CHADSVASC=2 (HTN, CHF).  Cardiology on board and managing.  Suspected this is critical illness driven.  Converted back to sinus rhythm.  Currently on amiodarone for total of 28 days.  Heparin is stopped due to some bleeding at the dialysis catheter.  Will defer to cardiology for reinitiation of heparin.  Appreciate their help.   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  Per cardiology, this is likely secondary to hypertensive cardiomyopathy.  Per cardiology, he is Not a candidate for ACE/ARB/Arni/MRA  given ESRD.  However I wonder if he will be a candidate once he will be deemed as permanent dialysis patient.  Currently he is on Coreg 25 mg p.o. twice daily, hydralazine 100 mg p.o. 3 times daily and Imdur 30 mg p.o. daily. Not a candidate for SGLT2 inhibitor currently.   Hypertension: Controlled.  Continue current medications as stated above.   Pancytopenia: Suspect secondary to chronic kidney disease.  Platelets and white blood cells fairly stable but some drop in hemoglobin up to 7.0 today.  Will need PRBC transfusion.  Defer to nephrology.  Discussed with Dr. Justin Mend and informed him about low hemoglobin.  DVT prophylaxis: enoxaparin (LOVENOX) injection 30 mg Start: 02/16/21 1100 SCDs Start: 02/14/21 2244   Code Status: Full Code  Family Communication:  None present at bedside.  Plan of care discussed with patient in length and he verbalized understanding and agreed with it.  Status is: Inpatient  Remains inpatient appropriate because:Inpatient level of care appropriate due to severity of illness  Dispo: The patient is from: Home              Anticipated d/c is to: Home              Patient currently is not medically stable to d/c.   Difficult to place patient No   Estimated body mass index is 25.06 kg/m as calculated from the following:   Height as of 12/19/20: 6' (1.829 m).   Weight as of this encounter: 83.8 kg.     Nutritional Assessment: Body mass  index is 25.06 kg/m.Marland Kitchen Seen by dietician.  I agree with the assessment and plan as outlined below: Nutrition Status:   Skin Assessment: I have examined the patient's skin and I agree with the wound assessment as performed by the wound care RN as outlined below:   Consultants:  Nephrology and cardiology  Procedures:  [facility] placement 02/15/2021.  Antimicrobials:  Anti-infectives (From admission, onward)    None          Subjective: Patient seen and examined.  He has no complaints at all.  Objective: Vitals:    02/17/21 0823 02/17/21 0955 02/17/21 1000 02/17/21 1103  BP: 134/84 129/86 (!) 153/91   Pulse: 69  62   Resp:   11   Temp:    97.8 F (36.6 C)  TempSrc:    Oral  SpO2:   98%   Weight:        Intake/Output Summary (Last 24 hours) at 02/17/2021 1126 Last data filed at 02/17/2021 0800 Gross per 24 hour  Intake 292.01 ml  Output 650 ml  Net -357.99 ml   Filed Weights   02/15/21 0500 02/16/21 0347 02/17/21 0459  Weight: 80.2 kg 82.9 kg 83.8 kg    Examination:  General exam: Appears calm and comfortable  Respiratory system: Clear to auscultation. Respiratory effort normal. Cardiovascular system: S1 & S2 heard, RRR. No JVD, murmurs, rubs, gallops or clicks. No pedal edema. Gastrointestinal system: Abdomen is nondistended, soft and nontender. No organomegaly or masses felt. Normal bowel sounds heard. Central nervous system: Alert and oriented. No focal neurological deficits. Extremities: Symmetric 5 x 5 power. Skin: No rashes, lesions or ulcers Psychiatry: Judgement and insight appear normal. Mood & affect appropriate.    Data Reviewed: I have personally reviewed following labs and imaging studies  CBC: Recent Labs  Lab 02/14/21 1611 02/14/21 2102 02/15/21 0618 02/16/21 0154  WBC 3.2*  --  3.3* 3.6*  NEUTROABS 1.6*  --   --   --   HGB 7.8* 8.5* 7.2* 7.0*  HCT 23.6* 25.0* 21.4* 20.4*  MCV 80.8  --  79.6* 77.6*  PLT 82*  --  72* 68*   Basic Metabolic Panel: Recent Labs  Lab 02/14/21 1611 02/14/21 1954 02/14/21 2102 02/15/21 0618 02/16/21 0154 02/17/21 0008  NA 135  --  135 135 136 134*  K 4.2  --  4.1 3.5 3.0* 3.0*  CL 101  --   --  99 96* 94*  CO2 13*  --   --  16* 19* 22  GLUCOSE 89  --   --  129* 139* 102*  BUN 180*  --   --  177* 177* 126*  CREATININE 27.29*  --   --  27.50* 26.48* 20.26*  CALCIUM 6.5*  --   --  6.5* 6.0* 6.6*  MG  --  2.0  --  2.0 1.9 1.7  PHOS  --   --   --  10.4* 9.4* 6.5*   GFR: Estimated Creatinine Clearance: 4.3 mL/min (A) (by C-G  formula based on SCr of 20.26 mg/dL (H)). Liver Function Tests: Recent Labs  Lab 02/14/21 1611  AST 36  ALT 29  ALKPHOS 79  BILITOT 0.7  PROT 6.9  ALBUMIN 3.6   Recent Labs  Lab 02/14/21 1954 02/15/21 0618  LIPASE 216* 378*   No results for input(s): AMMONIA in the last 168 hours. Coagulation Profile: Recent Labs  Lab 02/14/21 1954  INR 1.3*   Cardiac Enzymes: No results for input(s): CKTOTAL, CKMB,  CKMBINDEX, TROPONINI in the last 168 hours. BNP (last 3 results) No results for input(s): PROBNP in the last 8760 hours. HbA1C: No results for input(s): HGBA1C in the last 72 hours. CBG: Recent Labs  Lab 02/15/21 0745 02/15/21 1154 02/16/21 2019 02/16/21 2308 02/17/21 0455  GLUCAP 126* 122* 108* 129* 100*   Lipid Profile: Recent Labs    02/15/21 0618  TRIG 130   Thyroid Function Tests: No results for input(s): TSH, T4TOTAL, FREET4, T3FREE, THYROIDAB in the last 72 hours. Anemia Panel: No results for input(s): VITAMINB12, FOLATE, FERRITIN, TIBC, IRON, RETICCTPCT in the last 72 hours. Sepsis Labs: Recent Labs  Lab 02/14/21 1611  LATICACIDVEN 0.7    Recent Results (from the past 240 hour(s))  Resp Panel by RT-PCR (Flu A&B, Covid) Nasopharyngeal Swab     Status: None   Collection Time: 02/14/21  7:17 PM   Specimen: Nasopharyngeal Swab; Nasopharyngeal(NP) swabs in vial transport medium  Result Value Ref Range Status   SARS Coronavirus 2 by RT PCR NEGATIVE NEGATIVE Final    Comment: (NOTE) SARS-CoV-2 target nucleic acids are NOT DETECTED.  The SARS-CoV-2 RNA is generally detectable in upper respiratory specimens during the acute phase of infection. The lowest concentration of SARS-CoV-2 viral copies this assay can detect is 138 copies/mL. A negative result does not preclude SARS-Cov-2 infection and should not be used as the sole basis for treatment or other patient management decisions. A negative result may occur with  improper specimen  collection/handling, submission of specimen other than nasopharyngeal swab, presence of viral mutation(s) within the areas targeted by this assay, and inadequate number of viral copies(<138 copies/mL). A negative result must be combined with clinical observations, patient history, and epidemiological information. The expected result is Negative.  Fact Sheet for Patients:  EntrepreneurPulse.com.au  Fact Sheet for Healthcare Providers:  IncredibleEmployment.be  This test is no t yet approved or cleared by the [place] FDA and  has been authorized for detection and/or diagnosis of SARS-CoV-2 by FDA under an Emergency Use Authorization (EUA). This EUA will remain  in effect (meaning this test can be used) for the duration of the COVID-19 declaration under Section 564(b)(1) of the Act, 21 U.S.C.section 360bbb-3(b)(1), unless the authorization is terminated  or revoked sooner.       Influenza A by PCR NEGATIVE NEGATIVE Final   Influenza B by PCR NEGATIVE NEGATIVE Final    Comment: (NOTE) The Xpert Xpress SARS-CoV-2/FLU/RSV plus assay is intended as an aid in the diagnosis of influenza from Nasopharyngeal swab specimens and should not be used as a sole basis for treatment. Nasal washings and aspirates are unacceptable for Xpert Xpress SARS-CoV-2/FLU/RSV testing.  Fact Sheet for Patients: EntrepreneurPulse.com.au  Fact Sheet for Healthcare Providers: IncredibleEmployment.be  This test is not yet approved or cleared by the [place] FDA and has been authorized for detection and/or diagnosis of SARS-CoV-2 by FDA under an Emergency Use Authorization (EUA). This EUA will remain in effect (meaning this test can be used) for the duration of the COVID-19 declaration under Section 564(b)(1) of the Act, 21 U.S.C. section 360bbb-3(b)(1), unless the authorization is terminated or revoked.  Performed at [facility], [place] [address], [place],  [address]   MRSA Next Gen by PCR, Nasal     Status: None   Collection Time: 02/15/21 12:38 AM   Specimen: Nasal Mucosa; Nasal Swab  Result Value Ref Range Status   MRSA by PCR Next Gen NOT DETECTED NOT DETECTED Final  Comment: (NOTE) The GeneXpert MRSA Assay (FDA approved for NASAL specimens only), is one component of a comprehensive MRSA colonization surveillance program. It is not intended to diagnose MRSA infection nor to guide or monitor treatment for MRSA infections. Test performance is not FDA approved in patients less than 91 years old. Performed at [facility], [place] [address], [place], [place] [address]       Radiology Studies: IR Fluoro Guide CV Line Right  Result Date: 02/15/2021 INDICATION: ESRD requiring HD. EXAM: TUNNELED CENTRAL VENOUS HEMODIALYSIS CATHETER PLACEMENT WITH ULTRASOUND AND FLUOROSCOPIC GUIDANCE MEDICATIONS: None. ANESTHESIA/SEDATION: Moderate (conscious) sedation was employed during this procedure. A total of Versed 1 mg and Fentanyl 50 mcg was administered intravenously. Moderate Sedation Time: 22 minutes. The patient's level of consciousness and vital signs were monitored continuously by radiology nursing throughout the procedure under my direct supervision. FLUOROSCOPY TIME:  0 minutes 18 seconds (1 mGy). COMPLICATIONS: None immediate. PROCEDURE: Informed written consent was obtained from the patient after a discussion of the risks, benefits, and alternatives to treatment. Questions regarding the procedure were encouraged and answered. The right neck and chest were prepped with chlorhexidine in a sterile fashion, and a sterile drape was applied covering the operative field. Maximum barrier sterile technique with sterile gowns and gloves were used for the procedure. A timeout was performed prior to the initiation of the procedure. After creating a small venotomy incision, a micropuncture kit was utilized to  access the internal jugular vein. Real-time ultrasound guidance was utilized for vascular access including the acquisition of a permanent ultrasound image documenting patency of the accessed vessel. The microwire was utilized to measure appropriate catheter length. A stiff Glidewire was advanced to the level of the IVC and the micropuncture sheath was exchanged for a peel-away sheath. A tunneled hemodialysis catheter measuring 23 cm from tip to cuff was tunneled in a retrograde fashion from the anterior chest wall to the venotomy incision. The catheter was then placed through the peel-away sheath with tips ultimately positioned within the superior aspect of the right atrium. Final catheter positioning was confirmed and documented with a spot radiographic image. The catheter aspirates and flushes normally. The catheter was flushed with appropriate volume heparin dwells. The catheter exit site was secured with a 2-0 nylon retention suture. The venotomy incision was closed with Dermabond. Dressings were applied. The patient tolerated the procedure well without immediate post procedural complication. IMPRESSION: Successful placement of 23 cm tip to cuff tunneled hemodialysis catheter via the right internal jugular vein with tips terminating within the superior aspect of the right atrium. The catheter is ready for immediate use. Michaelle Birks, MD [facility] Radiology Electronically Signed   By: Michaelle Birks M.D.   On: 02/15/2021 13:46   IR US Guide Vasc Access Right  Result Date: 02/15/2021 INDICATION: ESRD requiring HD. EXAM: TUNNELED CENTRAL VENOUS HEMODIALYSIS CATHETER PLACEMENT WITH ULTRASOUND AND FLUOROSCOPIC GUIDANCE MEDICATIONS: None. ANESTHESIA/SEDATION: Moderate (conscious) sedation was employed during this procedure. A total of Versed 1 mg and Fentanyl 50 mcg was administered intravenously. Moderate Sedation Time: 22 minutes. The patient's level of  consciousness and vital signs were monitored continuously by radiology nursing throughout the procedure under my direct supervision. FLUOROSCOPY TIME:  0 minutes 18 seconds (1 mGy). COMPLICATIONS: None immediate. PROCEDURE: Informed written consent was obtained from the patient after a discussion of the risks, benefits, and alternatives to treatment. Questions regarding the procedure were encouraged and answered. The right neck and chest were prepped  with chlorhexidine in a sterile fashion, and a sterile drape was applied covering the operative field. Maximum barrier sterile technique with sterile gowns and gloves were used for the procedure. A timeout was performed prior to the initiation of the procedure. After creating a small venotomy incision, a micropuncture kit was utilized to access the internal jugular vein. Real-time ultrasound guidance was utilized for vascular access including the acquisition of a permanent ultrasound image documenting patency of the accessed vessel. The microwire was utilized to measure appropriate catheter length. A stiff Glidewire was advanced to the level of the IVC and the micropuncture sheath was exchanged for a peel-away sheath. A tunneled hemodialysis catheter measuring 23 cm from tip to cuff was tunneled in a retrograde fashion from the anterior chest wall to the venotomy incision. The catheter was then placed through the peel-away sheath with tips ultimately positioned within the superior aspect of the right atrium. Final catheter positioning was confirmed and documented with a spot radiographic image. The catheter aspirates and flushes normally. The catheter was flushed with appropriate volume heparin dwells. The catheter exit site was secured with a 2-0 nylon retention suture. The venotomy incision was closed with Dermabond. Dressings were applied. The patient tolerated the procedure well without immediate post procedural complication. IMPRESSION: Successful placement of 23  cm tip to cuff tunneled hemodialysis catheter via the right internal jugular vein with tips terminating within the superior aspect of the right atrium. The catheter is ready for immediate use. Michaelle Birks, MD [facility] Radiology Electronically Signed   By: Michaelle Birks M.D.   On: 02/15/2021 13:46   ECHOCARDIOGRAM COMPLETE  Result Date: 02/15/2021    ECHOCARDIOGRAM REPORT   Patient Name:   DEKE TILGHMAN Date of Exam: 02/15/2021 Medical Rec #:  [id number]     Height:       72.0 in Accession #:    [id number]    Weight:       176.8 lb Date of Birth:  01/13/1961     BSA:          2.022 m Patient Age:    51 years      BP:           141/99 mmHg Patient Gender: M             HR:           57 bpm. Exam Location:  Inpatient Procedure: 2D Echo, 3D Echo, Cardiac Doppler and Color Doppler Indications:    Atrial fibrillation  History:        Patient has no prior history of Echocardiogram examinations.                 Risk Factors:Hypertension. ESRD.  Sonographer:    Clayton Lefort RDCS (AE) Referring Phys: [id number] [place]  1. Left ventricular ejection fraction, by estimation, is 40 to 45%. The left ventricle has mildly decreased function. The left ventricle has no regional wall motion abnormalities. There is severe concentric left ventricular hypertrophy. Left ventricular  diastolic parameters are consistent with Grade II diastolic dysfunction (pseudonormalization). Elevated left ventricular end-diastolic pressure.  2. Right ventricular systolic function is normal. The right ventricular size is normal. Tricuspid regurgitation signal is inadequate for assessing PA pressure.  3. Left atrial size was severely dilated.  4. Right atrial size was mildly dilated.  5. The mitral valve is grossly normal. Mild mitral valve regurgitation.  6. The aortic valve is tricuspid. There is mild thickening of  the aortic valve. Aortic valve regurgitation is trivial. No aortic  stenosis is present. Aortic valve mean gradient measures 6.0 mmHg.  7. Aortic dilatation noted. There is mild dilatation of the aortic root, measuring 41 mm.  8. The inferior vena cava is normal in size with <50% respiratory variability, suggesting right atrial pressure of 8 mmHg. Comparison(s): No prior Echocardiogram. FINDINGS  Left Ventricle: Left ventricular ejection fraction, by estimation, is 40 to 45%. The left ventricle has mildly decreased function. The left ventricle has no regional wall motion abnormalities. The left ventricular internal cavity size was normal in size. There is severe concentric left ventricular hypertrophy. Left ventricular diastolic parameters are consistent with Grade II diastolic dysfunction (pseudonormalization). Elevated left ventricular end-diastolic pressure. Right Ventricle: The right ventricular size is normal. No increase in right ventricular wall thickness. Right ventricular systolic function is normal. Tricuspid regurgitation signal is inadequate for assessing PA pressure. Left Atrium: Left atrial size was severely dilated. Right Atrium: Right atrial size was mildly dilated. Pericardium: There is no evidence of pericardial effusion. Mitral Valve: The mitral valve is grossly normal. Mild mitral valve regurgitation. MV peak gradient, 6.6 mmHg. The mean mitral valve gradient is 2.0 mmHg. Tricuspid Valve: The tricuspid valve is grossly normal. Tricuspid valve regurgitation is trivial. Aortic Valve: The aortic valve is tricuspid. There is mild thickening of the aortic valve. There is mild aortic valve annular calcification. Aortic valve regurgitation is trivial. No aortic stenosis is present. Aortic valve mean gradient measures 6.0 mmHg. Aortic valve peak gradient measures 9.0 mmHg. Aortic valve area, by VTI measures 2.31 cm. Pulmonic Valve: The pulmonic valve was grossly normal. Pulmonic valve regurgitation is trivial. Aorta: Aortic dilatation noted. There is mild dilatation of  the aortic root, measuring 41 mm. Venous: The inferior vena cava is normal in size with less than 50% respiratory variability, suggesting right atrial pressure of 8 mmHg. IAS/Shunts: No atrial level shunt detected by color flow Doppler.  LEFT VENTRICLE PLAX 2D LVIDd:         5.60 cm      Diastology LVIDs:         4.30 cm      LV e' medial:    4.95 cm/s LV PW:         2.00 cm      LV E/e' medial:  23.6 LV IVS:        2.00 cm      LV e' lateral:   10.10 cm/s LVOT diam:     2.30 cm      LV E/e' lateral: 11.6 LV SV:         81 LV SV Index:   40 LVOT Area:     4.15 cm                              3D Volume EF: LV Volumes (MOD)            3D EF:        45 % LV vol d, MOD A2C: 190.0 ml LV EDV:       232 ml LV vol d, MOD A4C: 166.0 ml LV ESV:       127 ml LV vol s, MOD A2C: 101.0 ml LV SV:        105 ml LV vol s, MOD A4C: 93.5 ml LV SV MOD A2C:     89.0 ml LV SV MOD A4C:  166.0 ml LV SV MOD BP:      88.2 ml RIGHT VENTRICLE             IVC RV Basal diam:  4.00 cm     IVC diam: 2.00 cm RV Mid diam:    2.20 cm RV S prime:     11.90 cm/s TAPSE (M-mode): 3.0 cm LEFT ATRIUM              Index       RIGHT ATRIUM           Index LA diam:        4.30 cm  2.13 cm/m  RA Area:     22.30 cm LA Vol (A2C):   116.0 ml 57.37 ml/m RA Volume:   70.00 ml  34.62 ml/m LA Vol (A4C):   115.0 ml 56.88 ml/m LA Biplane Vol: 120.0 ml 59.35 ml/m  AORTIC VALVE AV Area (Vmax):    2.39 cm AV Area (Vmean):   2.21 cm AV Area (VTI):     2.31 cm AV Vmax:           150.00 cm/s AV Vmean:          115.000 cm/s AV VTI:            0.350 m AV Peak Grad:      9.0 mmHg AV Mean Grad:      6.0 mmHg LVOT Vmax:         86.20 cm/s LVOT Vmean:        61.100 cm/s LVOT VTI:          0.195 m LVOT/AV VTI ratio: 0.56  AORTA Ao Root diam: 4.10 cm Ao Asc diam:  3.70 cm MITRAL VALVE MV Area (PHT): 2.38 cm     SHUNTS MV Area VTI:   2.19 cm     Systemic VTI:  0.20 m MV Peak grad:  6.6 mmHg     Systemic Diam: 2.30 cm MV Mean grad:  2.0 mmHg MV Vmax:       1.28 m/s MV  Vmean:      64.6 cm/s MV Decel Time: 319 msec MR Peak grad: 104.9 mmHg MR Mean grad: 68.0 mmHg MR Vmax:      512.00 cm/s MR Vmean:     387.0 cm/s MV E velocity: 117.00 cm/s MV A velocity: 58.70 cm/s MV E/A ratio:  1.99 Rozann Lesches MD Electronically signed by Rozann Lesches MD Signature Date/Time: 02/15/2021/3:01:03 PM    Final    VAS [place] UPPER EXT VEIN MAPPING (PRE-OP AVF)  Result Date: 02/16/2021 [place] MAPPING Patient Name:  CAMDEN KNOTEK  Date of Exam:   02/16/2021 Medical Rec #: [id number]      Accession #:    [id number] Date of Birth: 06/29/1960      Patient Gender: M Patient Age:   11 years Exam [facility]  [facility] Procedure:      VAS [place] UPPER EXT VEIN MAPPING (PRE-OP AVF) Referring Phys: JOSHUA ROBINS --------------------------------------------------------------------------------  Indications: Pre-access. Comparison Study: no prior Performing Technologist: Archie Patten RVS  Examination Guidelines: A complete evaluation includes B-mode imaging, spectral Doppler, color Doppler, and power Doppler as needed of all accessible portions of each vessel. Bilateral testing is considered an integral part of a complete examination. Limited examinations for reoccurring indications may be performed as noted. +-----------------+-------------+----------+---------+ Right Cephalic   Diameter (cm)Depth (cm)Findings  +-----------------+-------------+----------+---------+ Shoulder             0.13  0.24             +-----------------+-------------+----------+---------+ Prox upper arm       0.21        0.23             +-----------------+-------------+----------+---------+ Mid upper arm        0.22        0.35   branching +-----------------+-------------+----------+---------+ Dist upper arm       0.29        0.48             +-----------------+-------------+----------+---------+ Antecubital fossa    0.28        0.24              +-----------------+-------------+----------+---------+ Prox forearm         0.22        0.19             +-----------------+-------------+----------+---------+ Mid forearm          0.18        0.19             +-----------------+-------------+----------+---------+ Dist forearm         0.13        0.35             +-----------------+-------------+----------+---------+ Wrist                0.17        0.31             +-----------------+-------------+----------+---------+ +-----------------+-------------+----------+--------+ Right Basilic    Diameter (cm)Depth (cm)Findings +-----------------+-------------+----------+--------+ Prox upper arm       0.42        0.74            +-----------------+-------------+----------+--------+ Mid upper arm        0.32        0.72            +-----------------+-------------+----------+--------+ Dist upper arm       0.32        0.62            +-----------------+-------------+----------+--------+ Antecubital fossa    0.30        0.20            +-----------------+-------------+----------+--------+ Prox forearm         0.26        0.27            +-----------------+-------------+----------+--------+ Mid forearm          0.13        0.27            +-----------------+-------------+----------+--------+ Distal forearm       0.13        0.19            +-----------------+-------------+----------+--------+ +-----------------+-------------+----------+--------+ Left Cephalic    Diameter (cm)Depth (cm)Findings +-----------------+-------------+----------+--------+ Shoulder             0.31        0.27            +-----------------+-------------+----------+--------+ Prox upper arm       0.31        0.26            +-----------------+-------------+----------+--------+ Mid upper arm        0.24        0.26            +-----------------+-------------+----------+--------+ Dist upper arm       0.30  0.23             +-----------------+-------------+----------+--------+ Antecubital fossa    0.33        0.35            +-----------------+-------------+----------+--------+ Prox forearm         0.25        0.32            +-----------------+-------------+----------+--------+ Mid forearm          0.15        0.27            +-----------------+-------------+----------+--------+ Dist forearm         0.20        0.43            +-----------------+-------------+----------+--------+ Wrist                0.19        0.35            +-----------------+-------------+----------+--------+ +-----------------+-------------+----------+--------+ Left Basilic     Diameter (cm)Depth (cm)Findings +-----------------+-------------+----------+--------+ Prox upper arm       0.36        0.55            +-----------------+-------------+----------+--------+ Mid upper arm        0.36        0.52            +-----------------+-------------+----------+--------+ Dist upper arm       0.35        0.46            +-----------------+-------------+----------+--------+ Antecubital fossa    0.21        0.25            +-----------------+-------------+----------+--------+ Prox forearm         0.15        0.33            +-----------------+-------------+----------+--------+ Mid forearm          0.15        0.20            +-----------------+-------------+----------+--------+ Distal forearm       0.14        0.15            +-----------------+-------------+----------+--------+ *See table(s) above for measurements and observations.  Diagnosing physician:    Preliminary     Scheduled Meds:  amiodarone  400 mg Oral BID   Followed by   Derrill Memo ON 02/23/2021] amiodarone  200 mg Oral Daily   aspirin  81 mg Oral Daily   atorvastatin  40 mg Oral Daily   calcium acetate  1,334 mg Oral TID WC   calcium carbonate  1 tablet Oral TID WC   carvedilol  25 mg Oral BID WC   Chlorhexidine Gluconate Cloth  6 each  Topical Daily   Chlorhexidine Gluconate Cloth  6 each Topical Q0600   enoxaparin (LOVENOX) injection  30 mg Subcutaneous Q24H   hydrALAZINE  100 mg Oral Q12H   isosorbide mononitrate  30 mg Oral Daily   sodium chloride flush  10-40 mL Intracatheter Q12H   Continuous Infusions:  amiodarone Stopped (02/14/21 2137)     LOS: 3 days   Time spent: 35 minutes   Darliss Cheney, MD [facility]  02/17/2021, 11:26 AM  Please page via [facility] and do not message via secure chat for anything urgent. Secure chat can be used for anything non urgent.  How to contact the [facility] Attending  or Consulting provider [place] or covering provider during after hours [place], for this patient?  Check the care team in [facility] and look for a) attending/consulting [facility] provider listed and b) the [facility] team listed. Page or secure chat [phone]. Log into [email or web address] and use [place] universal password to access. If you do not have the password, please contact the hospital operator. Locate the [facility] provider you are looking for under [facility] and page to a number that you can be directly reached. If you still have difficulty reaching the provider, please page the [facility] (Director on Call) for the Hospitalists listed on amion for assistance.

## 2021-02-17 NOTE — Plan of Care (Signed)

## 2021-02-17 NOTE — Progress Notes (Signed)
[facility] ROUNDING NOTE   Subjective:   Interval History: 60 year old male presented to the emergency room with nausea vomiting abdominal pain.  Multiple courses of emesis.  In the emergency room developed A. fib with rapid ventricular rate.  He is a new start to dialysis.  He has positive hepatitis B antigen.  He has a history of congestive heart failure systolic dysfunction ejection fraction 40-45%.  Blood pressure 148/52 pulse 64 temperature 97.9 O2 sats 98% room air  Sodium 134 potassium 3 chloride 94 CO2 22 BUN 126 creatinine 20.  Glucose 102 calcium 6.6 phosphorus 6.5.  Next dialysis session will be 02/17/2021.  Isolation patient to hepatitis B profile positive.    Objective:  Vital signs in last 24 hours:  Temp:  [97.9 F (36.6 C)-98.3 F (36.8 C)] 97.9 F (36.6 C) (09/19 0400) Pulse Rate:  [61-69] 64 (09/19 0500) Resp:  [0-17] 10 (09/19 0500) BP: (123-163)/(72-128) 124/82 (09/19 0500) SpO2:  [93 %-100 %] 99 % (09/19 0500) Weight:  [83.8 kg] 83.8 kg (09/19 0459)  Weight change: 0.9 kg Filed Weights   02/15/21 0500 02/16/21 0347 02/17/21 0459  Weight: 80.2 kg 82.9 kg 83.8 kg    Intake/Output: I/O last 3 completed shifts: In: 3289.1 [P.O.:200; I.V.:2892.1; IV Piggyback:197.1] Out: 1050 [Urine:550; Other:500]   Intake/Output this shift:  No intake/output data recorded.  Gen:nad CVS:rrr Resp:normal wob GSU:PJSR Ext:no edema Neuro: no focal deficits HD access: [facility]   Basic Metabolic Panel: Recent Labs  Lab 02/14/21 1611 02/14/21 1954 02/14/21 2102 02/15/21 0618 02/16/21 0154 02/17/21 0008  NA 135  --  135 135 136 134*  K 4.2  --  4.1 3.5 3.0* 3.0*  CL 101  --   --  99 96* 94*  CO2 13*  --   --  16* 19* 22  GLUCOSE 89  --   --  129* 139* 102*  BUN 180*  --   --  177* 177* 126*  CREATININE 27.29*  --   --  27.50* 26.48* 20.26*  CALCIUM 6.5*  --   --  6.5* 6.0* 6.6*  MG  --  2.0  --  2.0 1.9 1.7  PHOS  --   --   --  10.4* 9.4* 6.5*     Liver Function Tests: Recent Labs  Lab 02/14/21 1611  AST 36  ALT 29  ALKPHOS 79  BILITOT 0.7  PROT 6.9  ALBUMIN 3.6   Recent Labs  Lab 02/14/21 1954 02/15/21 0618  LIPASE 216* 378*   No results for input(s): AMMONIA in the last 168 hours.  CBC: Recent Labs  Lab 02/14/21 1611 02/14/21 2102 02/15/21 0618 02/16/21 0154  WBC 3.2*  --  3.3* 3.6*  NEUTROABS 1.6*  --   --   --   HGB 7.8* 8.5* 7.2* 7.0*  HCT 23.6* 25.0* 21.4* 20.4*  MCV 80.8  --  79.6* 77.6*  PLT 82*  --  72* 68*    Cardiac Enzymes: No results for input(s): CKTOTAL, CKMB, CKMBINDEX, TROPONINI in the last 168 hours.  BNP: Invalid input(s): POCBNP  CBG: Recent Labs  Lab 02/15/21 0745 02/15/21 1154 02/16/21 2019 02/16/21 2308 02/17/21 0455  GLUCAP 126* 122* 108* 129* 100*    Microbiology: Results for orders placed or performed during the hospital encounter of 02/14/21  Resp Panel by RT-PCR (Flu A&B, Covid) Nasopharyngeal Swab     Status: None   Collection Time: 02/14/21  7:17 PM   Specimen: Nasopharyngeal Swab; Nasopharyngeal(NP) swabs in  vial transport medium  Result Value Ref Range Status   SARS Coronavirus 2 by RT PCR NEGATIVE NEGATIVE Final    Comment: (NOTE) SARS-CoV-2 target nucleic acids are NOT DETECTED.  The SARS-CoV-2 RNA is generally detectable in upper respiratory specimens during the acute phase of infection. The lowest concentration of SARS-CoV-2 viral copies this assay can detect is 138 copies/mL. A negative result does not preclude SARS-Cov-2 infection and should not be used as the sole basis for treatment or other patient management decisions. A negative result may occur with  improper specimen collection/handling, submission of specimen other than nasopharyngeal swab, presence of viral mutation(s) within the areas targeted by this assay, and inadequate number of viral copies(<138 copies/mL). A negative result must be combined with clinical observations, patient  history, and epidemiological information. The expected result is Negative.  Fact Sheet for Patients:  EntrepreneurPulse.com.au  Fact Sheet for Healthcare Providers:  IncredibleEmployment.be  This test is no t yet approved or cleared by the [place] FDA and  has been authorized for detection and/or diagnosis of SARS-CoV-2 by FDA under an Emergency Use Authorization (EUA). This EUA will remain  in effect (meaning this test can be used) for the duration of the COVID-19 declaration under Section 564(b)(1) of the Act, 21 U.S.C.section 360bbb-3(b)(1), unless the authorization is terminated  or revoked sooner.       Influenza A by PCR NEGATIVE NEGATIVE Final   Influenza B by PCR NEGATIVE NEGATIVE Final    Comment: (NOTE) The Xpert Xpress SARS-CoV-2/FLU/RSV plus assay is intended as an aid in the diagnosis of influenza from Nasopharyngeal swab specimens and should not be used as a sole basis for treatment. Nasal washings and aspirates are unacceptable for Xpert Xpress SARS-CoV-2/FLU/RSV testing.  Fact Sheet for Patients: EntrepreneurPulse.com.au  Fact Sheet for Healthcare Providers: IncredibleEmployment.be  This test is not yet approved or cleared by the [place] FDA and has been authorized for detection and/or diagnosis of SARS-CoV-2 by FDA under an Emergency Use Authorization (EUA). This EUA will remain in effect (meaning this test can be used) for the duration of the COVID-19 declaration under Section 564(b)(1) of the Act, 21 U.S.C. section 360bbb-3(b)(1), unless the authorization is terminated or revoked.  Performed at [facility], [place] [address] [place],  [address]   MRSA Next Gen by PCR, Nasal     Status: None   Collection Time: 02/15/21 12:38 AM   Specimen: Nasal Mucosa; Nasal Swab  Result Value Ref Range Status   MRSA by PCR Next Gen NOT DETECTED NOT DETECTED Final     Comment: (NOTE) The GeneXpert MRSA Assay (FDA approved for NASAL specimens only), is one component of a comprehensive MRSA colonization surveillance program. It is not intended to diagnose MRSA infection nor to guide or monitor treatment for MRSA infections. Test performance is not FDA approved in patients less than 42 years old. Performed at [facility], [place] [address] [place],  [address]     Coagulation Studies: Recent Labs    02/14/21 1954  LABPROT 16.3*  INR 1.3*    Urinalysis: No results for input(s): COLORURINE, LABSPEC, PHURINE, GLUCOSEU, HGBUR, BILIRUBINUR, KETONESUR, PROTEINUR, UROBILINOGEN, NITRITE, LEUKOCYTESUR in the last 72 hours.  Invalid input(s): APPERANCEUR    Imaging: IR Fluoro Guide CV Line Right  Result Date: 02/15/2021 INDICATION: ESRD requiring HD. EXAM: TUNNELED CENTRAL VENOUS HEMODIALYSIS CATHETER PLACEMENT WITH ULTRASOUND AND FLUOROSCOPIC GUIDANCE MEDICATIONS: None. ANESTHESIA/SEDATION: Moderate (conscious) sedation was employed during this procedure. A total of Versed  1 mg and Fentanyl 50 mcg was administered intravenously. Moderate Sedation Time: 22 minutes. The patient's level of consciousness and vital signs were monitored continuously by radiology nursing throughout the procedure under my direct supervision. FLUOROSCOPY TIME:  0 minutes 18 seconds (1 mGy). COMPLICATIONS: None immediate. PROCEDURE: Informed written consent was obtained from the patient after a discussion of the risks, benefits, and alternatives to treatment. Questions regarding the procedure were encouraged and answered. The right neck and chest were prepped with chlorhexidine in a sterile fashion, and a sterile drape was applied covering the operative field. Maximum barrier sterile technique with sterile gowns and gloves were used for the procedure. A timeout was performed prior to the initiation of the procedure. After creating a small venotomy incision, a micropuncture kit  was utilized to access the internal jugular vein. Real-time ultrasound guidance was utilized for vascular access including the acquisition of a permanent ultrasound image documenting patency of the accessed vessel. The microwire was utilized to measure appropriate catheter length. A stiff Glidewire was advanced to the level of the IVC and the micropuncture sheath was exchanged for a peel-away sheath. A tunneled hemodialysis catheter measuring 23 cm from tip to cuff was tunneled in a retrograde fashion from the anterior chest wall to the venotomy incision. The catheter was then placed through the peel-away sheath with tips ultimately positioned within the superior aspect of the right atrium. Final catheter positioning was confirmed and documented with a spot radiographic image. The catheter aspirates and flushes normally. The catheter was flushed with appropriate volume heparin dwells. The catheter exit site was secured with a 2-0 nylon retention suture. The venotomy incision was closed with Dermabond. Dressings were applied. The patient tolerated the procedure well without immediate post procedural complication. IMPRESSION: Successful placement of 23 cm tip to cuff tunneled hemodialysis catheter via the right internal jugular vein with tips terminating within the superior aspect of the right atrium. The catheter is ready for immediate use. Michaelle Birks, MD [facility] Electronically Signed   By: Michaelle Birks M.D.   On: 02/15/2021 13:46   IR US Guide Vasc Access Right  Result Date: 02/15/2021 INDICATION: ESRD requiring HD. EXAM: TUNNELED CENTRAL VENOUS HEMODIALYSIS CATHETER PLACEMENT WITH ULTRASOUND AND FLUOROSCOPIC GUIDANCE MEDICATIONS: None. ANESTHESIA/SEDATION: Moderate (conscious) sedation was employed during this procedure. A total of Versed 1 mg and Fentanyl 50 mcg was administered intravenously. Moderate Sedation Time: 22 minutes. The patient's  level of consciousness and vital signs were monitored continuously by radiology nursing throughout the procedure under my direct supervision. FLUOROSCOPY TIME:  0 minutes 18 seconds (1 mGy). COMPLICATIONS: None immediate. PROCEDURE: Informed written consent was obtained from the patient after a discussion of the risks, benefits, and alternatives to treatment. Questions regarding the procedure were encouraged and answered. The right neck and chest were prepped with chlorhexidine in a sterile fashion, and a sterile drape was applied covering the operative field. Maximum barrier sterile technique with sterile gowns and gloves were used for the procedure. A timeout was performed prior to the initiation of the procedure. After creating a small venotomy incision, a micropuncture kit was utilized to access the internal jugular vein. Real-time ultrasound guidance was utilized for vascular access including the acquisition of a permanent ultrasound image documenting patency of the accessed vessel. The microwire was utilized to measure appropriate catheter length. A stiff Glidewire was advanced to the level of the IVC and the micropuncture sheath was exchanged for a peel-away sheath. A tunneled hemodialysis catheter  measuring 23 cm from tip to cuff was tunneled in a retrograde fashion from the anterior chest wall to the venotomy incision. The catheter was then placed through the peel-away sheath with tips ultimately positioned within the superior aspect of the right atrium. Final catheter positioning was confirmed and documented with a spot radiographic image. The catheter aspirates and flushes normally. The catheter was flushed with appropriate volume heparin dwells. The catheter exit site was secured with a 2-0 nylon retention suture. The venotomy incision was closed with Dermabond. Dressings were applied. The patient tolerated the procedure well without immediate post procedural complication. IMPRESSION: Successful  placement of 23 cm tip to cuff tunneled hemodialysis catheter via the right internal jugular vein with tips terminating within the superior aspect of the right atrium. The catheter is ready for immediate use. Michaelle Birks, MD [facility] Electronically Signed   By: Michaelle Birks M.D.   On: 02/15/2021 13:46   ECHOCARDIOGRAM COMPLETE  Result Date: 02/15/2021    ECHOCARDIOGRAM REPORT   Patient Name:   Antonio Keller Date of Exam: 02/15/2021 Medical Rec #:  [id number]     Height:       72.0 in Accession #:    [id number]    Weight:       176.8 lb Date of Birth:  29-Oct-1960     BSA:          2.022 m Patient Age:    58 years      BP:           141/99 mmHg Patient Gender: M             HR:           57 bpm. Exam Location:  Inpatient Procedure: 2D Echo, 3D Echo, Cardiac Doppler and Color Doppler Indications:    Atrial fibrillation  History:        Patient has no prior history of Echocardiogram examinations.                 Risk Factors:Hypertension. ESRD.  Sonographer:    Clayton Lefort RDCS (AE) Referring Phys: [id number] [place]  1. Left ventricular ejection fraction, by estimation, is 40 to 45%. The left ventricle has mildly decreased function. The left ventricle has no regional wall motion abnormalities. There is severe concentric left ventricular hypertrophy. Left ventricular  diastolic parameters are consistent with Grade II diastolic dysfunction (pseudonormalization). Elevated left ventricular end-diastolic pressure.  2. Right ventricular systolic function is normal. The right ventricular size is normal. Tricuspid regurgitation signal is inadequate for assessing PA pressure.  3. Left atrial size was severely dilated.  4. Right atrial size was mildly dilated.  5. The mitral valve is grossly normal. Mild mitral valve regurgitation.  6. The aortic valve is tricuspid. There is mild thickening of the aortic valve. Aortic valve regurgitation is trivial.  No aortic stenosis is present. Aortic valve mean gradient measures 6.0 mmHg.  7. Aortic dilatation noted. There is mild dilatation of the aortic root, measuring 41 mm.  8. The inferior vena cava is normal in size with <50% respiratory variability, suggesting right atrial pressure of 8 mmHg. Comparison(s): No prior Echocardiogram. FINDINGS  Left Ventricle: Left ventricular ejection fraction, by estimation, is 40 to 45%. The left ventricle has mildly decreased function. The left ventricle has no regional wall motion abnormalities. The left ventricular internal cavity size was normal in size. There is severe concentric left ventricular hypertrophy. Left ventricular diastolic parameters are  consistent with Grade II diastolic dysfunction (pseudonormalization). Elevated left ventricular end-diastolic pressure. Right Ventricle: The right ventricular size is normal. No increase in right ventricular wall thickness. Right ventricular systolic function is normal. Tricuspid regurgitation signal is inadequate for assessing PA pressure. Left Atrium: Left atrial size was severely dilated. Right Atrium: Right atrial size was mildly dilated. Pericardium: There is no evidence of pericardial effusion. Mitral Valve: The mitral valve is grossly normal. Mild mitral valve regurgitation. MV peak gradient, 6.6 mmHg. The mean mitral valve gradient is 2.0 mmHg. Tricuspid Valve: The tricuspid valve is grossly normal. Tricuspid valve regurgitation is trivial. Aortic Valve: The aortic valve is tricuspid. There is mild thickening of the aortic valve. There is mild aortic valve annular calcification. Aortic valve regurgitation is trivial. No aortic stenosis is present. Aortic valve mean gradient measures 6.0 mmHg. Aortic valve peak gradient measures 9.0 mmHg. Aortic valve area, by VTI measures 2.31 cm. Pulmonic Valve: The pulmonic valve was grossly normal. Pulmonic valve regurgitation is trivial. Aorta: Aortic dilatation noted. There is mild  dilatation of the aortic root, measuring 41 mm. Venous: The inferior vena cava is normal in size with less than 50% respiratory variability, suggesting right atrial pressure of 8 mmHg. IAS/Shunts: No atrial level shunt detected by color flow Doppler.  LEFT VENTRICLE PLAX 2D LVIDd:         5.60 cm      Diastology LVIDs:         4.30 cm      LV e' medial:    4.95 cm/s LV PW:         2.00 cm      LV E/e' medial:  23.6 LV IVS:        2.00 cm      LV e' lateral:   10.10 cm/s LVOT diam:     2.30 cm      LV E/e' lateral: 11.6 LV SV:         81 LV SV Index:   40 LVOT Area:     4.15 cm                              3D Volume EF: LV Volumes (MOD)            3D EF:        45 % LV vol d, MOD A2C: 190.0 ml LV EDV:       232 ml LV vol d, MOD A4C: 166.0 ml LV ESV:       127 ml LV vol s, MOD A2C: 101.0 ml LV SV:        105 ml LV vol s, MOD A4C: 93.5 ml LV SV MOD A2C:     89.0 ml LV SV MOD A4C:     166.0 ml LV SV MOD BP:      88.2 ml RIGHT VENTRICLE             IVC RV Basal diam:  4.00 cm     IVC diam: 2.00 cm RV Mid diam:    2.20 cm RV S prime:     11.90 cm/s TAPSE (M-mode): 3.0 cm LEFT ATRIUM              Index       RIGHT ATRIUM           Index LA diam:        4.30 cm  2.13 cm/m  RA  Area:     22.30 cm LA Vol (A2C):   116.0 ml 57.37 ml/m RA Volume:   70.00 ml  34.62 ml/m LA Vol (A4C):   115.0 ml 56.88 ml/m LA Biplane Vol: 120.0 ml 59.35 ml/m  AORTIC VALVE AV Area (Vmax):    2.39 cm AV Area (Vmean):   2.21 cm AV Area (VTI):     2.31 cm AV Vmax:           150.00 cm/s AV Vmean:          115.000 cm/s AV VTI:            0.350 m AV Peak Grad:      9.0 mmHg AV Mean Grad:      6.0 mmHg LVOT Vmax:         86.20 cm/s LVOT Vmean:        61.100 cm/s LVOT VTI:          0.195 m LVOT/AV VTI ratio: 0.56  AORTA Ao Root diam: 4.10 cm Ao Asc diam:  3.70 cm MITRAL VALVE MV Area (PHT): 2.38 cm     SHUNTS MV Area VTI:   2.19 cm     Systemic VTI:  0.20 m MV Peak grad:  6.6 mmHg     Systemic Diam: 2.30 cm MV Mean grad:  2.0 mmHg MV Vmax:        1.28 m/s MV Vmean:      64.6 cm/s MV Decel Time: 319 msec MR Peak grad: 104.9 mmHg MR Mean grad: 68.0 mmHg MR Vmax:      512.00 cm/s MR Vmean:     387.0 cm/s MV E velocity: 117.00 cm/s MV A velocity: 58.70 cm/s MV E/A ratio:  1.99 Rozann Lesches MD Electronically signed by Rozann Lesches MD Signature Date/Time: 02/15/2021/3:01:03 PM    Final    VAS [place] UPPER EXT VEIN MAPPING (PRE-OP AVF)  Result Date: 02/16/2021 [place] MAPPING Patient Name:  Antonio Keller  Date of Exam:   02/16/2021 Medical Rec #: [id number]      Accession #:    [id number] Date of Birth: 03-19-1961      Patient Gender: M Patient Age:   51 years Exam Location:  [facility] Procedure:      VAS [place] UPPER EXT VEIN MAPPING (PRE-OP AVF) Referring Phys: JOSHUA ROBINS --------------------------------------------------------------------------------  Indications: Pre-access. Comparison Study: no prior Performing Technologist: Archie Patten RVS  Examination Guidelines: A complete evaluation includes B-mode imaging, spectral Doppler, color Doppler, and power Doppler as needed of all accessible portions of each vessel. Bilateral testing is considered an integral part of a complete examination. Limited examinations for reoccurring indications may be performed as noted. +-----------------+-------------+----------+---------+ Right Cephalic   Diameter (cm)Depth (cm)Findings  +-----------------+-------------+----------+---------+ Shoulder             0.13        0.24             +-----------------+-------------+----------+---------+ Prox upper arm       0.21        0.23             +-----------------+-------------+----------+---------+ Mid upper arm        0.22        0.35   branching +-----------------+-------------+----------+---------+ Dist upper arm       0.29        0.48             +-----------------+-------------+----------+---------+ Antecubital fossa    0.28  0.24              +-----------------+-------------+----------+---------+ Prox forearm         0.22        0.19             +-----------------+-------------+----------+---------+ Mid forearm          0.18        0.19             +-----------------+-------------+----------+---------+ Dist forearm         0.13        0.35             +-----------------+-------------+----------+---------+ Wrist                0.17        0.31             +-----------------+-------------+----------+---------+ +-----------------+-------------+----------+--------+ Right Basilic    Diameter (cm)Depth (cm)Findings +-----------------+-------------+----------+--------+ Prox upper arm       0.42        0.74            +-----------------+-------------+----------+--------+ Mid upper arm        0.32        0.72            +-----------------+-------------+----------+--------+ Dist upper arm       0.32        0.62            +-----------------+-------------+----------+--------+ Antecubital fossa    0.30        0.20            +-----------------+-------------+----------+--------+ Prox forearm         0.26        0.27            +-----------------+-------------+----------+--------+ Mid forearm          0.13        0.27            +-----------------+-------------+----------+--------+ Distal forearm       0.13        0.19            +-----------------+-------------+----------+--------+ +-----------------+-------------+----------+--------+ Left Cephalic    Diameter (cm)Depth (cm)Findings +-----------------+-------------+----------+--------+ Shoulder             0.31        0.27            +-----------------+-------------+----------+--------+ Prox upper arm       0.31        0.26            +-----------------+-------------+----------+--------+ Mid upper arm        0.24        0.26            +-----------------+-------------+----------+--------+ Dist upper arm       0.30        0.23             +-----------------+-------------+----------+--------+ Antecubital fossa    0.33        0.35            +-----------------+-------------+----------+--------+ Prox forearm         0.25        0.32            +-----------------+-------------+----------+--------+ Mid forearm          0.15        0.27            +-----------------+-------------+----------+--------+ Dist forearm  0.20        0.43            +-----------------+-------------+----------+--------+ Wrist                0.19        0.35            +-----------------+-------------+----------+--------+ +-----------------+-------------+----------+--------+ Left Basilic     Diameter (cm)Depth (cm)Findings +-----------------+-------------+----------+--------+ Prox upper arm       0.36        0.55            +-----------------+-------------+----------+--------+ Mid upper arm        0.36        0.52            +-----------------+-------------+----------+--------+ Dist upper arm       0.35        0.46            +-----------------+-------------+----------+--------+ Antecubital fossa    0.21        0.25            +-----------------+-------------+----------+--------+ Prox forearm         0.15        0.33            +-----------------+-------------+----------+--------+ Mid forearm          0.15        0.20            +-----------------+-------------+----------+--------+ Distal forearm       0.14        0.15            +-----------------+-------------+----------+--------+ *See table(s) above for measurements and observations.  Diagnosing physician:    Preliminary      Medications:    amiodarone Stopped (02/14/21 2137)    amiodarone  400 mg Oral BID   Followed by   Derrill Memo ON 02/23/2021] amiodarone  200 mg Oral Daily   aspirin  81 mg Oral Daily   atorvastatin  40 mg Oral Daily   calcium acetate  1,334 mg Oral TID WC   calcium carbonate  1 tablet Oral TID WC   carvedilol  25 mg Oral BID  WC   Chlorhexidine Gluconate Cloth  6 each Topical Daily   Chlorhexidine Gluconate Cloth  6 each Topical Q0600   enoxaparin (LOVENOX) injection  30 mg Subcutaneous Q24H   hydrALAZINE  100 mg Oral Q12H   isosorbide mononitrate  30 mg Oral Daily   sodium chloride flush  10-40 mL Intracatheter Q12H   alteplase, docusate sodium, heparin, heparin sodium (porcine), lidocaine, polyethylene glycol, sodium chloride flush  Assessment/ Plan:  CKD5, 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 HD#1 9/18, next dialysis planned for 02/17/2021 -Consulted VVS for permanent access, appreciate assistance -CLIP for outpatient placement -Continue to monitor daily Cr, Dose meds for GFR<15 -Monitor Daily I/Os, Daily weight  -Maintain MAP>65 for optimal renal perfusion.  -Agree with holding ACE-I, avoid further nephrotoxins including NSAIDS, Morphine.  Unless absolutely necessary, avoid CT with contrast and/or MRI with gadolinium.      Vomiting, nausea -likely uremic symptoms, starting HD as above   Hypocalcemia -replete PRN, 3.5Ca bath with HD, switching binder to phoslo. On oscal   Afib w/ RVR -cardio on board, currently on amio   Hypertension: -home meds on hold, BP acceptable   Metabolic acidosis, metabolic acidemia -  2/2 to severe CKD -on nahco3 PO, will stop this now that he is on HD   Anemia due to chronic kidney disease: -Transfuse for Hgb<7 g/dL -iron panel ordered   CKD-MBD -PTH ordered -will change renvela to phoslo given hypocalcemia   LOS: 3 Sherril Croon @TODAY @7 :00 AM

## 2021-02-18 DIAGNOSIS — N186 End stage renal disease: Secondary | ICD-10-CM | POA: Diagnosis not present

## 2021-02-18 DIAGNOSIS — I5021 Acute systolic (congestive) heart failure: Secondary | ICD-10-CM | POA: Diagnosis not present

## 2021-02-18 DIAGNOSIS — I248 Other forms of acute ischemic heart disease: Secondary | ICD-10-CM | POA: Diagnosis not present

## 2021-02-18 DIAGNOSIS — I4891 Unspecified atrial fibrillation: Secondary | ICD-10-CM | POA: Diagnosis not present

## 2021-02-18 LAB — PREPARE RBC (CROSSMATCH)

## 2021-02-18 LAB — CBC
HCT: 19.6 % — ABNORMAL LOW (ref 39.0–52.0)
Hemoglobin: 6.4 g/dL — CL (ref 13.0–17.0)
MCH: 26.3 pg (ref 26.0–34.0)
MCHC: 32.7 g/dL (ref 30.0–36.0)
MCV: 80.7 fL (ref 80.0–100.0)
Platelets: 48 10*3/uL — ABNORMAL LOW (ref 150–400)
RBC: 2.43 MIL/uL — ABNORMAL LOW (ref 4.22–5.81)
RDW: 15.3 % (ref 11.5–15.5)
WBC: 3.9 10*3/uL — ABNORMAL LOW (ref 4.0–10.5)
nRBC: 0 % (ref 0.0–0.2)

## 2021-02-18 LAB — GLUCOSE, CAPILLARY: Glucose-Capillary: 103 mg/dL — ABNORMAL HIGH (ref 70–99)

## 2021-02-18 LAB — BASIC METABOLIC PANEL
Anion gap: 17 — ABNORMAL HIGH (ref 5–15)
BUN: 95 mg/dL — ABNORMAL HIGH (ref 6–20)
CO2: 23 mmol/L (ref 22–32)
Calcium: 7.4 mg/dL — ABNORMAL LOW (ref 8.9–10.3)
Chloride: 95 mmol/L — ABNORMAL LOW (ref 98–111)
Creatinine, Ser: 17.11 mg/dL — ABNORMAL HIGH (ref 0.61–1.24)
GFR, Estimated: 3 mL/min — ABNORMAL LOW (ref >60)
Glucose, Bld: 103 mg/dL — ABNORMAL HIGH (ref 70–99)
Potassium: 3.1 mmol/L — ABNORMAL LOW (ref 3.5–5.1)
Sodium: 135 mmol/L (ref 135–145)

## 2021-02-18 LAB — CALCIUM, IONIZED: Calcium, Ionized, Serum: 3.5 mg/dL — ABNORMAL LOW (ref 4.5–5.6)

## 2021-02-18 LAB — HEPATITIS B SURFACE ANTIBODY, QUANTITATIVE: Hep B S AB Quant (Post): <3.1 m[IU]/mL — ABNORMAL LOW (ref >9.9)

## 2021-02-18 LAB — HEMOGLOBIN AND HEMATOCRIT, BLOOD
HCT: 21.5 % — ABNORMAL LOW (ref 39.0–52.0)
Hemoglobin: 7.2 g/dL — ABNORMAL LOW (ref 13.0–17.0)

## 2021-02-18 LAB — PTH, INTACT AND CALCIUM
Calcium, Total (PTH): 6.4 mg/dL — CL (ref 8.6–10.2)
PTH: 903 pg/mL — ABNORMAL HIGH (ref 15–65)

## 2021-02-18 MED ORDER — SODIUM CHLORIDE 0.9% IV SOLUTION: Freq: Once | INTRAVENOUS | Status: DC

## 2021-02-18 MED ORDER — CHLORHEXIDINE GLUCONATE CLOTH 2 % EX PADS
6.0000 | MEDICATED_PAD | Freq: Every day | CUTANEOUS | Status: DC
  Administered 2021-02-18 – 2021-02-19 (×2): 6 via TOPICAL

## 2021-02-18 MED ORDER — CALCITRIOL 0.25 MCG PO CAPS
0.2500 ug | ORAL_CAPSULE | Freq: Every day | ORAL | Status: DC
  Administered 2021-02-18 – 2021-02-22 (×5): 0.25 ug via ORAL
  Filled 2021-02-18 (×5): qty 1

## 2021-02-18 NOTE — Progress Notes (Signed)
Cardiology Progress Note  Patient ID: Antonio Keller MRN: [id number] DOB: Dec 16, 1960 Date of Encounter: 02/18/2021  Primary Cardiologist: None  Subjective   Hemoglobin down to 6.4 today, giving 1 unit PRBCs.  He is doing well, denies any chest pain, dyspnea, or palpitations.  Inpatient Medications  Scheduled Meds:  amiodarone  400 mg Oral BID   Followed by   Derrill Memo ON 02/23/2021] amiodarone  200 mg Oral Daily   aspirin  81 mg Oral Daily   atorvastatin  40 mg Oral Daily   calcium acetate  1,334 mg Oral TID WC   calcium carbonate  1 tablet Oral TID WC   carvedilol  25 mg Oral BID WC   Chlorhexidine Gluconate Cloth  6 each Topical Daily   Chlorhexidine Gluconate Cloth  6 each Topical Q0600   enoxaparin (LOVENOX) injection  30 mg Subcutaneous Q24H   hydrALAZINE  100 mg Oral Q12H   isosorbide mononitrate  30 mg Oral Daily   sodium chloride flush  10-40 mL Intracatheter Q12H   Continuous Infusions:   PRN Meds: docusate sodium, heparin sodium (porcine), lidocaine, polyethylene glycol, sodium chloride flush   Vital Signs   Vitals:   02/18/21 0700 02/18/21 0703 02/18/21 0900 02/18/21 1000  BP: 125/77  132/73 125/80  Pulse: 64  69 67  Resp: '11  10 12  '$ Temp:  97.9 F (36.6 C)    TempSrc:  Oral    SpO2: 100%  95% 98%  Weight:        Intake/Output Summary (Last 24 hours) at 02/18/2021 1024 Last data filed at 02/17/2021 1800 Gross per 24 hour  Intake 480 ml  Output 1000 ml  Net -520 ml    Last 3 Weights 02/18/2021 02/17/2021 02/17/2021  Weight (lbs) 182 lb 5.1 oz 182 lb 5.1 oz 184 lb 11.9 oz  Weight (kg) 82.7 kg 82.7 kg 83.8 kg      Telemetry  NSR in 60s, intermittent Afib which I personally reviewed.   ECG  NSR, rate 63, LVH with repolarization abnormalities, QTc 523 which I personally reviewed.   Physical Exam   Vitals:   02/18/21 0700 02/18/21 0703 02/18/21 0900 02/18/21 1000  BP: 125/77  132/73 125/80  Pulse: 64  69 67  Resp: '11  10 12  '$ Temp:  97.9 F (36.6  C)    TempSrc:  Oral    SpO2: 100%  95% 98%  Weight:        Intake/Output Summary (Last 24 hours) at 02/18/2021 1024 Last data filed at 02/17/2021 1800 Gross per 24 hour  Intake 480 ml  Output 1000 ml  Net -520 ml     Last 3 Weights 02/18/2021 02/17/2021 02/17/2021  Weight (lbs) 182 lb 5.1 oz 182 lb 5.1 oz 184 lb 11.9 oz  Weight (kg) 82.7 kg 82.7 kg 83.8 kg    Body mass index is 24.73 kg/m.   General: Well nourished, well developed, in no acute distress Head: Atraumatic, normal size  Eyes: PEERLA, EOMI  Neck: Supple, no JVD Cardiac: Normal S1, S2; RRR; no murmurs, rubs, or gallops Lungs: CTAB Abd: Soft, nontender, no hepatomegaly  Ext: No edema Musculoskeletal: No deformities Skin: Warm and dry  Neuro: Alert and oriented to person, place, time, and situation Psych: Normal mood and affect   Labs  High Sensitivity Troponin:   Recent Labs  Lab 02/14/21 1954 02/14/21 2216  TROPONINIHS 99* 82*      Cardiac EnzymesNo results for input(s): TROPONINI in the last 168 hours. No  results for input(s): TROPIPOC in the last 168 hours.  Chemistry Recent Labs  Lab 02/14/21 1611 02/14/21 2102 02/17/21 0008 02/17/21 2152 02/18/21 0247  NA 135   < > 134* 135 135  K 4.2   < > 3.0* 3.0* 3.1*  CL 101   < > 94* 99 95*  CO2 13*   < > '22 23 23  '$ GLUCOSE 89   < > 102* 117* 103*  BUN 180*   < > 126* 92* 95*  CREATININE 27.29*   < > 20.26* 16.65* 17.11*  CALCIUM 6.5*   < > 6.6*  6.4* 7.4* 7.4*  PROT 6.9  --   --  6.0*  --   ALBUMIN 3.6  --   --  3.0*  --   AST 36  --   --  53*  --   ALT 29  --   --  41  --   ALKPHOS 79  --   --  80  --   BILITOT 0.7  --   --  0.5  --   GFRNONAA 2*   < > 2* 3* 3*  ANIONGAP 21*   < > 18* 13 17*   < > = values in this interval not displayed.     Hematology Recent Labs  Lab 02/15/21 0618 02/16/21 0154 02/18/21 0247  WBC 3.3* 3.6* 3.9*  RBC 2.69* 2.63* 2.43*  HGB 7.2* 7.0* 6.4*  HCT 21.4* 20.4* 19.6*  MCV 79.6* 77.6* 80.7  MCH 26.8 26.6  26.3  MCHC 33.6 34.3 32.7  RDW 15.6* 15.1 15.3  PLT 72* 68* 48*    BNPNo results for input(s): BNP, PROBNP in the last 168 hours.  DDimer No results for input(s): DDIMER in the last 168 hours.   Radiology  VAS [place] UPPER EXT VEIN MAPPING (PRE-OP AVF)  Result Date: 02/16/2021 UPPER EXTREMITY VEIN MAPPING Patient Name:  Antonio Keller  Date of Exam:   02/16/2021 Medical Rec #: [id number]      Accession #:    [id number] Date of Birth: Jun 03, 1960      Patient Gender: M Patient Age:   60 years Exam Location:  [facility] Procedure:      VAS [place] UPPER EXT VEIN MAPPING (PRE-OP AVF) Referring Phys: JOSHUA ROBINS --------------------------------------------------------------------------------  Indications: Pre-access. Comparison Study: no prior Performing Technologist: Archie Patten RVS  Examination Guidelines: A complete evaluation includes B-mode imaging, spectral Doppler, color Doppler, and power Doppler as needed of all accessible portions of each vessel. Bilateral testing is considered an integral part of a complete examination. Limited examinations for reoccurring indications may be performed as noted. +-----------------+-------------+----------+---------+ Right Cephalic   Diameter (cm)Depth (cm)Findings  +-----------------+-------------+----------+---------+ Shoulder             0.13        0.24             +-----------------+-------------+----------+---------+ Prox upper arm       0.21        0.23             +-----------------+-------------+----------+---------+ Mid upper arm        0.22        0.35   branching +-----------------+-------------+----------+---------+ Dist upper arm       0.29        0.48             +-----------------+-------------+----------+---------+ Antecubital fossa    0.28        0.24             +-----------------+-------------+----------+---------+  Prox forearm         0.22        0.19              +-----------------+-------------+----------+---------+ Mid forearm          0.18        0.19             +-----------------+-------------+----------+---------+ Dist forearm         0.13        0.35             +-----------------+-------------+----------+---------+ Wrist                0.17        0.31             +-----------------+-------------+----------+---------+ +-----------------+-------------+----------+--------+ Right Basilic    Diameter (cm)Depth (cm)Findings +-----------------+-------------+----------+--------+ Prox upper arm       0.42        0.74            +-----------------+-------------+----------+--------+ Mid upper arm        0.32        0.72            +-----------------+-------------+----------+--------+ Dist upper arm       0.32        0.62            +-----------------+-------------+----------+--------+ Antecubital fossa    0.30        0.20            +-----------------+-------------+----------+--------+ Prox forearm         0.26        0.27            +-----------------+-------------+----------+--------+ Mid forearm          0.13        0.27            +-----------------+-------------+----------+--------+ Distal forearm       0.13        0.19            +-----------------+-------------+----------+--------+ +-----------------+-------------+----------+--------+ Left Cephalic    Diameter (cm)Depth (cm)Findings +-----------------+-------------+----------+--------+ Shoulder             0.31        0.27            +-----------------+-------------+----------+--------+ Prox upper arm       0.31        0.26            +-----------------+-------------+----------+--------+ Mid upper arm        0.24        0.26            +-----------------+-------------+----------+--------+ Dist upper arm       0.30        0.23            +-----------------+-------------+----------+--------+ Antecubital fossa    0.33        0.35             +-----------------+-------------+----------+--------+ Prox forearm         0.25        0.32            +-----------------+-------------+----------+--------+ Mid forearm          0.15        0.27            +-----------------+-------------+----------+--------+ Dist forearm         0.20        0.43            +-----------------+-------------+----------+--------+  Wrist                0.19        0.35            +-----------------+-------------+----------+--------+ +-----------------+-------------+----------+--------+ Left Basilic     Diameter (cm)Depth (cm)Findings +-----------------+-------------+----------+--------+ Prox upper arm       0.36        0.55            +-----------------+-------------+----------+--------+ Mid upper arm        0.36        0.52            +-----------------+-------------+----------+--------+ Dist upper arm       0.35        0.46            +-----------------+-------------+----------+--------+ Antecubital fossa    0.21        0.25            +-----------------+-------------+----------+--------+ Prox forearm         0.15        0.33            +-----------------+-------------+----------+--------+ Mid forearm          0.15        0.20            +-----------------+-------------+----------+--------+ Distal forearm       0.14        0.15            +-----------------+-------------+----------+--------+ *See table(s) above for measurements and observations.  Diagnosing physician: Orlie Pollen Electronically signed by Orlie Pollen on 02/16/2021 at 2:56:24 PM.    Final     Cardiac Studies  TTE 02/15/2021  1. Left ventricular ejection fraction, by estimation, is 40 to 45%. The  left ventricle has mildly decreased function. The left ventricle has no  regional wall motion abnormalities. There is severe concentric left  ventricular hypertrophy. Left ventricular   diastolic parameters are consistent with Grade II diastolic dysfunction   (pseudonormalization). Elevated left ventricular end-diastolic pressure.   2. Right ventricular systolic function is normal. The right ventricular  size is normal. Tricuspid regurgitation signal is inadequate for assessing  PA pressure.   3. Left atrial size was severely dilated.   4. Right atrial size was mildly dilated.   5. The mitral valve is grossly normal. Mild mitral valve regurgitation.   6. The aortic valve is tricuspid. There is mild thickening of the aortic  valve. Aortic valve regurgitation is trivial. No aortic stenosis is  present. Aortic valve mean gradient measures 6.0 mmHg.   7. Aortic dilatation noted. There is mild dilatation of the aortic root,  measuring 41 mm.   8. The inferior vena cava is normal in size with <50% respiratory  variability, suggesting right atrial pressure of 8 mmHg.   Patient Profile  Antonio Keller is a 60 y.o. male with hypertension, CKD stage V who was admitted on 02/15/2021 with nausea vomiting with progression of kidney disease requiring initiation of hemodialysis.  Cardiology was consulted for atrial fibrillation as well as systolic heart failure.  Assessment & Plan   #New onset atrial fibrillation -CHADSVASC=2 (HTN, CHF).  -Suspect his critical illness drove his atrial fibrillation.  He is converted back to sinus rhythm but having paroxysms of A. fib.  Started on amiodarone with plan for 400 mg twice daily for 7 days and then 200 mg daily for 21 days.   -He does merit anticoagulation with a new diagnosis of  systolic heart failure.  He is anemic but this was felt to be chronic.  He was started on heparin drip but developed bleeding around tunneled catheter site.  Hemoglobin down to 6.4 and platelets down to 48.  Would hold off on anticoagulation at this time.  Can revisit if improvement in anemia/thrombocytopenia  #New onset systolic heart failure, EF 40-45% -Severe concentric LVH.  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  Continue with  dialysis for volume removal. -He is on Coreg 25 mg twice daily. -Not a candidate for ACE/ARB/Arni/MRA given ESRD. -He is on hydralazine 100 mg 3 times daily.  Have added Imdur 30 mg daily.  We can titrate up for better BP control.  Suspect this will improve with dialysis. -Not a candidate for SGLT2 inhibitor currently.  #QTc prolongation -He has LVH with repolarization abnormality, QTc 523 on EKG 9/19.  Will monitor  #Elevated troponin -Suspect demand ischemia.  Do not suspect an acute coronary syndrome.  Suspect this was minimally elevated in the setting of severe metabolic acidosis as well as hypertensive heart disease. -Can pursue outpatient cardiac stress test  -Can discontinue aspirin given anemia/thrombocytopenia.  -Have added a statin.  #Anion gap metabolic acidosis #CKD stage V now hemodialysis -Corrected with hemodialysis.  Per nephrology.  For questions or updates, please contact [place] Please consult [email or web address] for contact info under    Donato Heinz, MD

## 2021-02-18 NOTE — Progress Notes (Addendum)
PROGRESS NOTE    Antonio Keller  [id number] DOB: 08-29-1960 DOA: 02/14/2021 PCP: Jolinda Croak, MD   Brief Narrative:  Prestan Brand ,is a 60 y.o. male with past medical history of end-stage renal disease, not on dialysis, hypertension, who presented to the [facility] ED with a chief complaint of nausea, vomiting, and abdominal pain ongoing for 2 weeks, worse since 1 week.  Became increasingly fatigued so he called EMS on 02/14/2021.   ED course was notable for ABG of 7.22/30/114/12.5, Trop 99, MG 2.0, K 4.1, HGB 8.5, lipase 216. Lactate 0.7, AG 21, BUN 180 While in the ED he developed afib with RVR and was started on an amio gtt. he was admitted under ICU.  Neph and Cardiology were consulted by the ED.  Transferred under [facility] on 02/17/2021.  Assessment & Plan:   Active Problems:   Essential hypertension   Nausea and vomiting   New onset atrial fibrillation (HCC)   ESRD (end stage renal disease) (HCC)  End-stage renal disease, newly started on hemodialysis/High anion gap metabolic acidosis: Nephrology following.  [place] placed on 02/15/2021 by IR.  Received first session of dialysis on 02/16/2021.  VS consulted for permanent access however they prefer his platelets to get better before they do this.  Management per nephrology and we appreciate their help.    Hypokalemia: 3.1.  Defer to nephrology.  Acute hepatitis B infection: Hepatitis B surface antigen positive but patient completely asymptomatic and LFTs normal.  No indication to treat acutely.  Will need follow-up with ID as outpatient.  Paroxysmal new onset atrial fibrillation/Prolonged QT: CHADSVASC=2 (HTN, CHF).  Cardiology on board and managing.  Suspected this is critical illness driven.  Converted back to sinus rhythm.  Currently on amiodarone for total of 28 days.  Heparin is stopped due to some bleeding at the dialysis catheter.  Now more drop in hemoglobin.  Cardiology recommends holding off on heparin for now.   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  Per cardiology, this is likely secondary to hypertensive cardiomyopathy.  Per cardiology, he is Not a candidate for ACE/ARB/Arni/MRA given ESRD.  However I wonder if he will be a candidate once he will be deemed as permanent dialysis patient.  Currently he is on Coreg 25 mg p.o. twice daily, hydralazine 100 mg p.o. 3 times daily and Imdur 30 mg p.o. daily. Not a candidate for SGLT2 inhibitor currently.   Hypertension: Controlled.  Continue current medications as stated above.   Pancytopenia: Suspect secondary to chronic kidney disease.  Platelets and white blood cells fairly stable but some drop in hemoglobin up to 7.0 yesterday.  Notified Dr. Justin Mend and defer to him for blood transfusion but it appears that patient did not receive any yesterday and his hemoglobin is further down to 6.4 today.  Type and screen was ordered by night hospitalist but no transfusion orders.  Will order 1 unit transfusion.    DVT prophylaxis: enoxaparin (LOVENOX) injection 30 mg Start: 02/16/21 1100 SCDs Start: 02/14/21 2244   Code Status: Full Code  Family Communication:  None present at bedside.  Plan of care discussed with patient in length and he verbalized understanding and agreed with it.  Status is: Inpatient  Remains inpatient appropriate because:Inpatient level of care appropriate due to severity of illness  Dispo: The patient is from: Home              Anticipated d/c  is to: Home              Patient currently is not medically stable to d/c.   Difficult to place patient No   Estimated body mass index is 24.73 kg/m as calculated from the following:   Height as of 12/19/20: 6' (1.829 m).   Weight as of this encounter: 82.7 kg.     Nutritional Assessment: Body mass index is 24.73 kg/m.Marland Kitchen Seen by dietician.  I agree with the assessment and plan as outlined below: Nutrition Status:   Skin  Assessment: I have examined the patient's skin and I agree with the wound assessment as performed by the wound care RN as outlined below:   Consultants:  Nephrology and cardiology  Procedures:  [facility] placement 02/15/2021.  Antimicrobials:  Anti-infectives (From admission, onward)    None          Subjective: Seen and examined.  No complaints  Objective: Vitals:   02/18/21 0700 02/18/21 0703 02/18/21 0900 02/18/21 1000  BP: 125/77  132/73 125/80  Pulse: 64  69 67  Resp: '11  10 12  '$ Temp:  97.9 F (36.6 C)    TempSrc:  Oral    SpO2: 100%  95% 98%  Weight:        Intake/Output Summary (Last 24 hours) at 02/18/2021 1100 Last data filed at 02/17/2021 1800 Gross per 24 hour  Intake 480 ml  Output 1000 ml  Net -520 ml    Filed Weights   02/17/21 1300 02/17/21 1505 02/18/21 0500  Weight: 83.8 kg 82.7 kg 82.7 kg    Examination:  General exam: Appears calm and comfortable  Respiratory system: Clear to auscultation. Respiratory effort normal. Cardiovascular system: S1 & S2 heard, RRR. No JVD, murmurs, rubs, gallops or clicks. No pedal edema. Gastrointestinal system: Abdomen is nondistended, soft and nontender. No organomegaly or masses felt. Normal bowel sounds heard. Central nervous system: Alert and oriented. No focal neurological deficits. Extremities: Symmetric 5 x 5 power. Skin: No rashes, lesions or ulcers.  Psychiatry: Judgement and insight appear normal. Mood & affect appropriate.   Data Reviewed: I have personally reviewed following labs and imaging studies  CBC: Recent Labs  Lab 02/14/21 1611 02/14/21 2102 02/15/21 0618 02/16/21 0154 02/18/21 0247  WBC 3.2*  --  3.3* 3.6* 3.9*  NEUTROABS 1.6*  --   --   --   --   HGB 7.8* 8.5* 7.2* 7.0* 6.4*  HCT 23.6* 25.0* 21.4* 20.4* 19.6*  MCV 80.8  --  79.6* 77.6* 80.7  PLT 82*  --  72* 68* 48*    Basic Metabolic Panel: Recent Labs  Lab 02/14/21 1954 02/14/21 2102 02/15/21 0618 02/16/21 0154  02/17/21 0008 02/17/21 2152 02/18/21 0247  NA  --    < > 135 136 134* 135 135  K  --    < > 3.5 3.0* 3.0* 3.0* 3.1*  CL  --   --  99 96* 94* 99 95*  CO2  --   --  16* 19* '22 23 23  '$ GLUCOSE  --   --  129* 139* 102* 117* 103*  BUN  --   --  177* 177* 126* 92* 95*  CREATININE  --   --  27.50* 26.48* 20.26* 16.65* 17.11*  CALCIUM  --   --  6.5* 6.0* 6.6*  6.4* 7.4* 7.4*  MG 2.0  --  2.0 1.9 1.7 1.7  --   PHOS  --   --  10.4* 9.4* 6.5*  --   --    < > =  values in this interval not displayed.    GFR: Estimated Creatinine Clearance: 5 mL/min (A) (by C-G formula based on SCr of 17.11 mg/dL (H)). Liver Function Tests: Recent Labs  Lab 02/14/21 1611 02/17/21 2152  AST 36 53*  ALT 29 41  ALKPHOS 79 80  BILITOT 0.7 0.5  PROT 6.9 6.0*  ALBUMIN 3.6 3.0*    Recent Labs  Lab 02/14/21 1954 02/15/21 0618  LIPASE 216* 378*    No results for input(s): AMMONIA in the last 168 hours. Coagulation Profile: Recent Labs  Lab 02/14/21 1954  INR 1.3*    Cardiac Enzymes: No results for input(s): CKTOTAL, CKMB, CKMBINDEX, TROPONINI in the last 168 hours. BNP (last 3 results) No results for input(s): PROBNP in the last 8760 hours. HbA1C: No results for input(s): HGBA1C in the last 72 hours. CBG: Recent Labs  Lab 02/16/21 2019 02/16/21 2308 02/17/21 0455 02/17/21 1931 02/18/21 0045  GLUCAP 108* 129* 100* 141* 103*    Lipid Profile: No results for input(s): CHOL, HDL, LDLCALC, TRIG, CHOLHDL, LDLDIRECT in the last 72 hours.  Thyroid Function Tests: No results for input(s): TSH, T4TOTAL, FREET4, T3FREE, THYROIDAB in the last 72 hours. Anemia Panel: No results for input(s): VITAMINB12, FOLATE, FERRITIN, TIBC, IRON, RETICCTPCT in the last 72 hours. Sepsis Labs: Recent Labs  Lab 02/14/21 1611  LATICACIDVEN 0.7     Recent Results (from the past 240 hour(s))  Resp Panel by RT-PCR (Flu A&B, Covid) Nasopharyngeal Swab     Status: None   Collection Time: 02/14/21  7:17 PM    Specimen: Nasopharyngeal Swab; Nasopharyngeal(NP) swabs in vial transport medium  Result Value Ref Range Status   SARS Coronavirus 2 by RT PCR NEGATIVE NEGATIVE Final    Comment: (NOTE) SARS-CoV-2 target nucleic acids are NOT DETECTED.  The SARS-CoV-2 RNA is generally detectable in upper respiratory specimens during the acute phase of infection. The lowest concentration of SARS-CoV-2 viral copies this assay can detect is 138 copies/mL. A negative result does not preclude SARS-Cov-2 infection and should not be used as the sole basis for treatment or other patient management decisions. A negative result may occur with  improper specimen collection/handling, submission of specimen other than nasopharyngeal swab, presence of viral mutation(s) within the areas targeted by this assay, and inadequate number of viral copies(<138 copies/mL). A negative result must be combined with clinical observations, patient history, and epidemiological information. The expected result is Negative.  Fact Sheet for Patients:  EntrepreneurPulse.com.au  Fact Sheet for Healthcare Providers:  IncredibleEmployment.be  This test is no t yet approved or cleared by the [place] FDA and  has been authorized for detection and/or diagnosis of SARS-CoV-2 by FDA under an Emergency Use Authorization (EUA). This EUA will remain  in effect (meaning this test can be used) for the duration of the COVID-19 declaration under Section 564(b)(1) of the Act, 21 U.S.C.section 360bbb-3(b)(1), unless the authorization is terminated  or revoked sooner.       Influenza A by PCR NEGATIVE NEGATIVE Final   Influenza B by PCR NEGATIVE NEGATIVE Final    Comment: (NOTE) The Xpert Xpress SARS-CoV-2/FLU/RSV plus assay is intended as an aid in the diagnosis of influenza from Nasopharyngeal swab specimens and should not be used as a sole basis for treatment. Nasal washings and aspirates are  unacceptable for Xpert Xpress SARS-CoV-2/FLU/RSV testing.  Fact Sheet for Patients: EntrepreneurPulse.com.au  Fact Sheet for Healthcare Providers: IncredibleEmployment.be  This test is not yet approved or cleared by the [place] FDA  and has been authorized for detection and/or diagnosis of SARS-CoV-2 by FDA under an Emergency Use Authorization (EUA). This EUA will remain in effect (meaning this test can be used) for the duration of the COVID-19 declaration under Section 564(b)(1) of the Act, 21 U.S.C. section 360bbb-3(b)(1), unless the authorization is terminated or revoked.  Performed at [facility], [place] [address] [place], [place] [address]   MRSA Next Gen by PCR, Nasal     Status: None   Collection Time: 02/15/21 12:38 AM   Specimen: Nasal Mucosa; Nasal Swab  Result Value Ref Range Status   MRSA by PCR Next Gen NOT DETECTED NOT DETECTED Final    Comment: (NOTE) The GeneXpert MRSA Assay (FDA approved for NASAL specimens only), is one component of a comprehensive MRSA colonization surveillance program. It is not intended to diagnose MRSA infection nor to guide or monitor treatment for MRSA infections. Test performance is not FDA approved in patients less than 28 years old. Performed at [facility], [place] [address], [place], [place] [address]        Radiology Studies: VAS [place] UPPER EXT VEIN MAPPING (PRE-OP AVF)  Result Date: 02/16/2021 [place] MAPPING Patient Name:  TREA KENWORTHY  Date of Exam:   02/16/2021 Medical Rec #: [id number]      Accession #:    [id number] Date of Birth: 03-Jan-1961      Patient Gender: M Patient Age:   52 years Exam Location:  [facility] Procedure:      VAS [place] UPPER EXT VEIN MAPPING (PRE-OP AVF) Referring Phys: JOSHUA ROBINS --------------------------------------------------------------------------------  Indications: Pre-access. Comparison Study: no prior Performing Technologist:  Archie Patten RVS  Examination Guidelines: A complete evaluation includes B-mode imaging, spectral Doppler, color Doppler, and power Doppler as needed of all accessible portions of each vessel. Bilateral testing is considered an integral part of a complete examination. Limited examinations for reoccurring indications may be performed as noted. +-----------------+-------------+----------+---------+ Right Cephalic   Diameter (cm)Depth (cm)Findings  +-----------------+-------------+----------+---------+ Shoulder             0.13        0.24             +-----------------+-------------+----------+---------+ Prox upper arm       0.21        0.23             +-----------------+-------------+----------+---------+ Mid upper arm        0.22        0.35   branching +-----------------+-------------+----------+---------+ Dist upper arm       0.29        0.48             +-----------------+-------------+----------+---------+ Antecubital fossa    0.28        0.24             +-----------------+-------------+----------+---------+ Prox forearm         0.22        0.19             +-----------------+-------------+----------+---------+ Mid forearm          0.18        0.19             +-----------------+-------------+----------+---------+ Dist forearm         0.13        0.35             +-----------------+-------------+----------+---------+ Wrist  0.17        0.31             +-----------------+-------------+----------+---------+ +-----------------+-------------+----------+--------+ Right Basilic    Diameter (cm)Depth (cm)Findings +-----------------+-------------+----------+--------+ Prox upper arm       0.42        0.74            +-----------------+-------------+----------+--------+ Mid upper arm        0.32        0.72            +-----------------+-------------+----------+--------+ Dist upper arm       0.32        0.62             +-----------------+-------------+----------+--------+ Antecubital fossa    0.30        0.20            +-----------------+-------------+----------+--------+ Prox forearm         0.26        0.27            +-----------------+-------------+----------+--------+ Mid forearm          0.13        0.27            +-----------------+-------------+----------+--------+ Distal forearm       0.13        0.19            +-----------------+-------------+----------+--------+ +-----------------+-------------+----------+--------+ Left Cephalic    Diameter (cm)Depth (cm)Findings +-----------------+-------------+----------+--------+ Shoulder             0.31        0.27            +-----------------+-------------+----------+--------+ Prox upper arm       0.31        0.26            +-----------------+-------------+----------+--------+ Mid upper arm        0.24        0.26            +-----------------+-------------+----------+--------+ Dist upper arm       0.30        0.23            +-----------------+-------------+----------+--------+ Antecubital fossa    0.33        0.35            +-----------------+-------------+----------+--------+ Prox forearm         0.25        0.32            +-----------------+-------------+----------+--------+ Mid forearm          0.15        0.27            +-----------------+-------------+----------+--------+ Dist forearm         0.20        0.43            +-----------------+-------------+----------+--------+ Wrist                0.19        0.35            +-----------------+-------------+----------+--------+ +-----------------+-------------+----------+--------+ Left Basilic     Diameter (cm)Depth (cm)Findings +-----------------+-------------+----------+--------+ Prox upper arm       0.36        0.55            +-----------------+-------------+----------+--------+ Mid upper arm        0.36        0.52             +-----------------+-------------+----------+--------+  Dist upper arm       0.35        0.46            +-----------------+-------------+----------+--------+ Antecubital fossa    0.21        0.25            +-----------------+-------------+----------+--------+ Prox forearm         0.15        0.33            +-----------------+-------------+----------+--------+ Mid forearm          0.15        0.20            +-----------------+-------------+----------+--------+ Distal forearm       0.14        0.15            +-----------------+-------------+----------+--------+ *See table(s) above for measurements and observations.  Diagnosing physician: Orlie Pollen Electronically signed by Orlie Pollen on 02/16/2021 at 2:56:24 PM.    Final     Scheduled Meds:  sodium chloride   Intravenous Once   amiodarone  400 mg Oral BID   Followed by   Derrill Memo ON 02/23/2021] amiodarone  200 mg Oral Daily   atorvastatin  40 mg Oral Daily   calcium acetate  1,334 mg Oral TID WC   calcium carbonate  1 tablet Oral TID WC   carvedilol  25 mg Oral BID WC   Chlorhexidine Gluconate Cloth  6 each Topical Daily   Chlorhexidine Gluconate Cloth  6 each Topical Q0600   enoxaparin (LOVENOX) injection  30 mg Subcutaneous Q24H   hydrALAZINE  100 mg Oral Q12H   isosorbide mononitrate  30 mg Oral Daily   sodium chloride flush  10-40 mL Intracatheter Q12H   Continuous Infusions:     LOS: 4 days   Time spent: 29 minutes   Darliss Cheney, MD [facility]  02/18/2021, 11:00 AM  Please page via [place] and do not message via secure chat for anything urgent. Secure chat can be used for anything non urgent.  How to contact the [facility] Attending or Consulting provider [place] or covering provider during after hours [place], for this patient?  Check the care team in [facility] and look for a) attending/consulting [facility] provider listed and b) the [facility] team listed. Page or secure chat [phone]. Log into [email or web address] and use [facility]'s universal password to access. If you do not have the password, please contact the hospital operator. Locate the [facility] provider you are looking for under [facility] and page to a number that you can be directly reached. If you still have difficulty reaching the provider, please page the [facility] (Director on Call) for the Hospitalists listed on amion for assistance.

## 2021-02-18 NOTE — Progress Notes (Signed)
Patient had a critical lab of 6.4 hbg noted in chart from 0320 and lab of 3.1 potassium at 0600  both relayed to [facility] from [facility].No further concerns.

## 2021-02-18 NOTE — Plan of Care (Signed)
Care plan reviewed with patient.

## 2021-02-18 NOTE — Progress Notes (Signed)
[facility] ROUNDING NOTE   Subjective:   Interval History: 60 year old male presented to the emergency room with nausea vomiting abdominal pain.  Multiple courses of emesis.  In the emergency room developed A. fib with rapid ventricular rate.  He is a new start to dialysis.  He has positive hepatitis B antigen.  He has a history of congestive heart failure systolic dysfunction ejection fraction 40-45%.  Blood pressure 128/89 pulse 65 temperature 97.9 O2 sats 98%.  Dialysis 02/17/2021 with 1.3 L removed.  Does have some urine output  Sodium 135 potassium 3.1 chloride 95 CO2 23 BUN 95 creatinine 17.1 glucose 103 hemoglobin 6.4  Appears to tolerated dialysis 02/17/2021 with 1.3 L removed.  Next dialysis will be 02/19/2021    Objective:  Vital signs in last 24 hours:  Temp:  [97.5 F (36.4 C)-99.1 F (37.3 C)] 97.9 F (36.6 C) (09/20 0703) Pulse Rate:  [62-111] 67 (09/20 1000) Resp:  [0-24] 12 (09/20 1000) BP: (115-152)/(70-94) 125/80 (09/20 1000) SpO2:  [95 %-100 %] 98 % (09/20 1000) Weight:  [82.7 kg-83.8 kg] 82.7 kg (09/20 0500)  Weight change: 0 kg Filed Weights   02/17/21 1300 02/17/21 1505 02/18/21 0500  Weight: 83.8 kg 82.7 kg 82.7 kg    Intake/Output: I/O last 3 completed shifts: In: 57 [P.O.:720] Out: 1900 [Urine:900; Other:1000]   Intake/Output this shift:  No intake/output data recorded.  Gen:nad CVS:rrr Resp:normal wob [id number] Ext:no edema Neuro: no focal deficits HD access: [facility]   Basic Metabolic Panel: Recent Labs  Lab 02/14/21 1954 02/14/21 2102 02/15/21 0618 02/16/21 0154 02/17/21 0008 02/17/21 2152 02/18/21 0247  NA  --    < > 135 136 134* 135 135  K  --    < > 3.5 3.0* 3.0* 3.0* 3.1*  CL  --   --  99 96* 94* 99 95*  CO2  --   --  16* 19* '22 23 23  '$ GLUCOSE  --   --  129* 139* 102* 117* 103*  BUN  --   --  177* 177* 126* 92* 95*  CREATININE  --   --  27.50* 26.48* 20.26* 16.65* 17.11*  CALCIUM  --   --  6.5* 6.0* 6.6*   6.4* 7.4* 7.4*  MG 2.0  --  2.0 1.9 1.7 1.7  --   PHOS  --   --  10.4* 9.4* 6.5*  --   --    < > = values in this interval not displayed.     Liver Function Tests: Recent Labs  Lab 02/14/21 1611 02/17/21 2152  AST 36 53*  ALT 29 41  ALKPHOS 79 80  BILITOT 0.7 0.5  PROT 6.9 6.0*  ALBUMIN 3.6 3.0*    Recent Labs  Lab 02/14/21 1954 02/15/21 0618  LIPASE 216* 378*    No results for input(s): AMMONIA in the last 168 hours.  CBC: Recent Labs  Lab 02/14/21 1611 02/14/21 2102 02/15/21 0618 02/16/21 0154 02/18/21 0247  WBC 3.2*  --  3.3* 3.6* 3.9*  NEUTROABS 1.6*  --   --   --   --   HGB 7.8* 8.5* 7.2* 7.0* 6.4*  HCT 23.6* 25.0* 21.4* 20.4* 19.6*  MCV 80.8  --  79.6* 77.6* 80.7  PLT 82*  --  72* 68* 48*     Cardiac Enzymes: No results for input(s): CKTOTAL, CKMB, CKMBINDEX, TROPONINI in the last 168 hours.  BNP: Invalid input(s): POCBNP  CBG: Recent Labs  Lab 02/16/21 2019 02/16/21  2308 02/17/21 0455 02/17/21 1931 02/18/21 0045  GLUCAP 108* 129* 100* 141* 103*     Microbiology: Results for orders placed or performed during the hospital encounter of 02/14/21  Resp Panel by RT-PCR (Flu A&B, Covid) Nasopharyngeal Swab     Status: None   Collection Time: 02/14/21  7:17 PM   Specimen: Nasopharyngeal Swab; Nasopharyngeal(NP) swabs in vial transport medium  Result Value Ref Range Status   SARS Coronavirus 2 by RT PCR NEGATIVE NEGATIVE Final    Comment: (NOTE) SARS-CoV-2 target nucleic acids are NOT DETECTED.  The SARS-CoV-2 RNA is generally detectable in upper respiratory specimens during the acute phase of infection. The lowest concentration of SARS-CoV-2 viral copies this assay can detect is 138 copies/mL. A negative result does not preclude SARS-Cov-2 infection and should not be used as the sole basis for treatment or other patient management decisions. A negative result may occur with  improper specimen collection/handling, submission of specimen  other than nasopharyngeal swab, presence of viral mutation(s) within the areas targeted by this assay, and inadequate number of viral copies(<138 copies/mL). A negative result must be combined with clinical observations, patient history, and epidemiological information. The expected result is Negative.  Fact Sheet for Patients:  EntrepreneurPulse.com.au  Fact Sheet for Healthcare Providers:  IncredibleEmployment.be  This test is no t yet approved or cleared by the [place] FDA and  has been authorized for detection and/or diagnosis of SARS-CoV-2 by FDA under an Emergency Use Authorization (EUA). This EUA will remain  in effect (meaning this test can be used) for the duration of the COVID-19 declaration under Section 564(b)(1) of the Act, 21 U.S.C.section 360bbb-3(b)(1), unless the authorization is terminated  or revoked sooner.       Influenza A by PCR NEGATIVE NEGATIVE Final   Influenza B by PCR NEGATIVE NEGATIVE Final    Comment: (NOTE) The Xpert Xpress SARS-CoV-2/FLU/RSV plus assay is intended as an aid in the diagnosis of influenza from Nasopharyngeal swab specimens and should not be used as a sole basis for treatment. Nasal washings and aspirates are unacceptable for Xpert Xpress SARS-CoV-2/FLU/RSV testing.  Fact Sheet for Patients: EntrepreneurPulse.com.au  Fact Sheet for Healthcare Providers: IncredibleEmployment.be  This test is not yet approved or cleared by the [place] FDA and has been authorized for detection and/or diagnosis of SARS-CoV-2 by FDA under an Emergency Use Authorization (EUA). This EUA will remain in effect (meaning this test can be used) for the duration of the COVID-19 declaration under Section 564(b)(1) of the Act, 21 U.S.C. section 360bbb-3(b)(1), unless the authorization is terminated or revoked.  Performed at [facility], [place] [address] [place],  [place] [address]   MRSA Next Gen by PCR, Nasal     Status: None   Collection Time: 02/15/21 12:38 AM   Specimen: Nasal Mucosa; Nasal Swab  Result Value Ref Range Status   MRSA by PCR Next Gen NOT DETECTED NOT DETECTED Final    Comment: (NOTE) The GeneXpert MRSA Assay (FDA approved for NASAL specimens only), is one component of a comprehensive MRSA colonization surveillance program. It is not intended to diagnose MRSA infection nor to guide or monitor treatment for MRSA infections. Test performance is not FDA approved in patients less than 62 years old. Performed at [facility], [place] [address] [place], [place] [address]     Coagulation Studies: No results for input(s): LABPROT, INR in the last 72 hours.   Urinalysis: No results for input(s): COLORURINE, LABSPEC, [place], [place], [place],  BILIRUBINUR, KETONESUR, PROTEINUR, UROBILINOGEN, NITRITE, LEUKOCYTESUR in the last 72 hours.  Invalid input(s): APPERANCEUR    Imaging: VAS [place] UPPER EXT VEIN MAPPING (PRE-OP AVF)  Result Date: 02/16/2021 UPPER EXTREMITY VEIN MAPPING Patient Name:  Antonio Keller  Date of Exam:   02/16/2021 Medical Rec #: [id number]      Accession #:    [id number] Date of Birth: 1961/02/10      Patient Gender: M Patient Age:   41 years Exam Location:  [facility] Procedure:      VAS [place] UPPER EXT VEIN MAPPING (PRE-OP AVF) Referring Phys: JOSHUA ROBINS --------------------------------------------------------------------------------  Indications: Pre-access. Comparison Study: no prior Performing Technologist: Archie Patten RVS  Examination Guidelines: A complete evaluation includes B-mode imaging, spectral Doppler, color Doppler, and power Doppler as needed of all accessible portions of each vessel. Bilateral testing is considered an integral part of a complete examination. Limited examinations for reoccurring indications may be performed as noted. +-----------------+-------------+----------+---------+ Right  Cephalic   Diameter (cm)Depth (cm)Findings  +-----------------+-------------+----------+---------+ Shoulder             0.13        0.24             +-----------------+-------------+----------+---------+ Prox upper arm       0.21        0.23             +-----------------+-------------+----------+---------+ Mid upper arm        0.22        0.35   branching +-----------------+-------------+----------+---------+ Dist upper arm       0.29        0.48             +-----------------+-------------+----------+---------+ Antecubital fossa    0.28        0.24             +-----------------+-------------+----------+---------+ Prox forearm         0.22        0.19             +-----------------+-------------+----------+---------+ Mid forearm          0.18        0.19             +-----------------+-------------+----------+---------+ Dist forearm         0.13        0.35             +-----------------+-------------+----------+---------+ Wrist                0.17        0.31             +-----------------+-------------+----------+---------+ +-----------------+-------------+----------+--------+ Right Basilic    Diameter (cm)Depth (cm)Findings +-----------------+-------------+----------+--------+ Prox upper arm       0.42        0.74            +-----------------+-------------+----------+--------+ Mid upper arm        0.32        0.72            +-----------------+-------------+----------+--------+ Dist upper arm       0.32        0.62            +-----------------+-------------+----------+--------+ Antecubital fossa    0.30        0.20            +-----------------+-------------+----------+--------+ Prox forearm         0.26  0.27            +-----------------+-------------+----------+--------+ Mid forearm          0.13        0.27            +-----------------+-------------+----------+--------+ Distal forearm       0.13        0.19             +-----------------+-------------+----------+--------+ +-----------------+-------------+----------+--------+ Left Cephalic    Diameter (cm)Depth (cm)Findings +-----------------+-------------+----------+--------+ Shoulder             0.31        0.27            +-----------------+-------------+----------+--------+ Prox upper arm       0.31        0.26            +-----------------+-------------+----------+--------+ Mid upper arm        0.24        0.26            +-----------------+-------------+----------+--------+ Dist upper arm       0.30        0.23            +-----------------+-------------+----------+--------+ Antecubital fossa    0.33        0.35            +-----------------+-------------+----------+--------+ Prox forearm         0.25        0.32            +-----------------+-------------+----------+--------+ Mid forearm          0.15        0.27            +-----------------+-------------+----------+--------+ Dist forearm         0.20        0.43            +-----------------+-------------+----------+--------+ Wrist                0.19        0.35            +-----------------+-------------+----------+--------+ +-----------------+-------------+----------+--------+ Left Basilic     Diameter (cm)Depth (cm)Findings +-----------------+-------------+----------+--------+ Prox upper arm       0.36        0.55            +-----------------+-------------+----------+--------+ Mid upper arm        0.36        0.52            +-----------------+-------------+----------+--------+ Dist upper arm       0.35        0.46            +-----------------+-------------+----------+--------+ Antecubital fossa    0.21        0.25            +-----------------+-------------+----------+--------+ Prox forearm         0.15        0.33            +-----------------+-------------+----------+--------+ Mid forearm          0.15        0.20             +-----------------+-------------+----------+--------+ Distal forearm       0.14        0.15            +-----------------+-------------+----------+--------+ *See table(s) above for measurements and observations.  Diagnosing physician: Orlie Pollen  Electronically signed by Orlie Pollen on 02/16/2021 at 2:56:24 PM.    Final      Medications:      sodium chloride   Intravenous Once   amiodarone  400 mg Oral BID   Followed by   Derrill Memo ON 02/23/2021] amiodarone  200 mg Oral Daily   atorvastatin  40 mg Oral Daily   calcium acetate  1,334 mg Oral TID WC   calcium carbonate  1 tablet Oral TID WC   carvedilol  25 mg Oral BID WC   Chlorhexidine Gluconate Cloth  6 each Topical Daily   Chlorhexidine Gluconate Cloth  6 each Topical Q0600   enoxaparin (LOVENOX) injection  30 mg Subcutaneous Q24H   hydrALAZINE  100 mg Oral Q12H   isosorbide mononitrate  30 mg Oral Daily   sodium chloride flush  10-40 mL Intracatheter Q12H   docusate sodium, heparin sodium (porcine), lidocaine, polyethylene glycol, sodium chloride flush  Assessment/ Plan:  CKD5, 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 -started HD .  02/16/2021, 02/17/2021.  Next dialysis be 02/19/2021 -Consulted VVS for permanent access, appreciate assistance.  Clip process  Vomiting, nausea -likely uremic symptoms, starting HD as above   Hypocalcemia -replete PRN, 3.5Ca bath with HD, switching binder to phoslo. On oscal.  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  We will start vitamin D.  Continues on calcium supplementation.  Follow renal panel daily   LOS: 4 Sherril Croon '@TODAY''@11'$ :08 AM

## 2021-02-19 DIAGNOSIS — N189 Chronic kidney disease, unspecified: Secondary | ICD-10-CM | POA: Diagnosis not present

## 2021-02-19 DIAGNOSIS — I509 Heart failure, unspecified: Secondary | ICD-10-CM | POA: Diagnosis not present

## 2021-02-19 DIAGNOSIS — I4891 Unspecified atrial fibrillation: Secondary | ICD-10-CM | POA: Diagnosis not present

## 2021-02-19 DIAGNOSIS — N178 Other acute kidney failure: Secondary | ICD-10-CM | POA: Diagnosis not present

## 2021-02-19 DIAGNOSIS — I248 Other forms of acute ischemic heart disease: Secondary | ICD-10-CM | POA: Diagnosis not present

## 2021-02-19 DIAGNOSIS — I132 Hypertensive heart and chronic kidney disease with heart failure and with stage 5 chronic kidney disease, or end stage renal disease: Principal | ICD-10-CM

## 2021-02-19 LAB — RENAL FUNCTION PANEL
Albumin: 2.9 g/dL — ABNORMAL LOW (ref 3.5–5.0)
Anion gap: 15 (ref 5–15)
BUN: 106 mg/dL — ABNORMAL HIGH (ref 6–20)
CO2: 23 mmol/L (ref 22–32)
Calcium: 7.6 mg/dL — ABNORMAL LOW (ref 8.9–10.3)
Chloride: 97 mmol/L — ABNORMAL LOW (ref 98–111)
Creatinine, Ser: 17.68 mg/dL — ABNORMAL HIGH (ref 0.61–1.24)
GFR, Estimated: 3 mL/min — ABNORMAL LOW (ref >60)
Glucose, Bld: 121 mg/dL — ABNORMAL HIGH (ref 70–99)
Phosphorus: 6.5 mg/dL — ABNORMAL HIGH (ref 2.5–4.6)
Potassium: 3.2 mmol/L — ABNORMAL LOW (ref 3.5–5.1)
Sodium: 135 mmol/L (ref 135–145)

## 2021-02-19 LAB — CBC
HCT: 22.2 % — ABNORMAL LOW (ref 39.0–52.0)
Hemoglobin: 7.4 g/dL — ABNORMAL LOW (ref 13.0–17.0)
MCH: 27 pg (ref 26.0–34.0)
MCHC: 33.3 g/dL (ref 30.0–36.0)
MCV: 81 fL (ref 80.0–100.0)
Platelets: 45 10*3/uL — ABNORMAL LOW (ref 150–400)
RBC: 2.74 MIL/uL — ABNORMAL LOW (ref 4.22–5.81)
RDW: 15.1 % (ref 11.5–15.5)
WBC: 4 10*3/uL (ref 4.0–10.5)
nRBC: 0 % (ref 0.0–0.2)

## 2021-02-19 LAB — FOLATE: Folate: 7.8 ng/mL (ref >5.9)

## 2021-02-19 LAB — DIC (DISSEMINATED INTRAVASCULAR COAGULATION)PANEL
D-Dimer, Quant: 5.53 ug/mL-FEU — ABNORMAL HIGH (ref 0.00–0.50)
Fibrinogen: 285 mg/dL (ref 210–475)
INR: 1.3 — ABNORMAL HIGH (ref 0.8–1.2)
Platelets: 46 10*3/uL — ABNORMAL LOW (ref 150–400)
Prothrombin Time: 15.7 seconds — ABNORMAL HIGH (ref 11.4–15.2)
Smear Review: NONE SEEN
aPTT: 45 seconds — ABNORMAL HIGH (ref 24–36)

## 2021-02-19 LAB — VITAMIN B12: Vitamin B-12: 285 pg/mL (ref 180–914)

## 2021-02-19 LAB — RETICULOCYTES
Immature Retic Fract: 2.7 % (ref 2.3–15.9)
RBC.: 2.76 MIL/uL — ABNORMAL LOW (ref 4.22–5.81)
Retic Count, Absolute: 13.8 10*3/uL — ABNORMAL LOW (ref 19.0–186.0)
Retic Ct Pct: 0.5 % (ref 0.4–3.1)

## 2021-02-19 LAB — IRON AND TIBC
Iron: 72 ug/dL (ref 45–182)
Saturation Ratios: 28 % (ref 17.9–39.5)
TIBC: 259 ug/dL (ref 250–450)
UIBC: 187 ug/dL

## 2021-02-19 LAB — DIRECT ANTIGLOBULIN TEST (NOT AT ARMC)
DAT, IgG: NEGATIVE
DAT, complement: NEGATIVE

## 2021-02-19 LAB — FERRITIN: Ferritin: 347 ng/mL — ABNORMAL HIGH (ref 24–336)

## 2021-02-19 LAB — LACTATE DEHYDROGENASE: LDH: 218 U/L — ABNORMAL HIGH (ref 98–192)

## 2021-02-19 MED ORDER — HEPARIN SODIUM (PORCINE) 1000 UNIT/ML IJ SOLN
INTRAMUSCULAR | Status: AC
  Filled 2021-02-19: qty 4

## 2021-02-19 NOTE — Progress Notes (Signed)
[facility] ROUNDING NOTE   Subjective:   Interval History: 60 year old male presented to the emergency room with nausea vomiting abdominal pain.  Multiple courses of emesis.  In the emergency room developed A. fib with rapid ventricular rate.  He is a new start to dialysis.  He has positive hepatitis B antigen.  He has a history of congestive heart failure systolic dysfunction ejection fraction 40-45%.  Blood pressure 113/64 pulse 63 temperature 98.3 O2 sats 98% room air.  Dialysis 02/17/2021 with 1.3 L removed.  Does have some urine output  Sodium 135 potassium 3.2 chloride 97 CO2 23 BUN 106 creatinine 17 glucose 121 calcium 7.6 phosphorus 6.5 albumin 2.9 hemoglobin 7.4  Appears to tolerated dialysis 02/17/2021 with 1.3 L removed.  Next dialysis will be 02/19/2021    Objective:  Vital signs in last 24 hours:  Temp:  [97.8 F (36.6 C)-99 F (37.2 C)] 98.3 F (36.8 C) (09/21 0744) Pulse Rate:  [62-88] 63 (09/21 0600) Resp:  [0-15] 10 (09/21 0600) BP: (100-147)/(62-93) 114/78 (09/21 0600) SpO2:  [95 %-100 %] 97 % (09/21 0600)  Weight change:  Filed Weights   02/17/21 1300 02/17/21 1505 02/18/21 0500  Weight: 83.8 kg 82.7 kg 82.7 kg    Intake/Output: I/O last 3 completed shifts: In: [id number] [P.O.:240; I.V.:20; Blood:384] Out: 450 [Urine:450]   Intake/Output this shift:  No intake/output data recorded.  Gen:nad CVS:rrr Resp:normal wob [id number] Ext:no edema Neuro: no focal deficits HD access: [facility]   Basic Metabolic Panel: Recent Labs  Lab 02/14/21 1954 02/14/21 2102 02/15/21 0618 02/16/21 0154 02/17/21 0008 02/17/21 2152 02/18/21 0247 02/19/21 0208  NA  --    < > 135 136 134* 135 135 135  K  --    < > 3.5 3.0* 3.0* 3.0* 3.1* 3.2*  CL  --   --  99 96* 94* 99 95* 97*  CO2  --   --  16* 19* '22 23 23 23  '$ GLUCOSE  --   --  129* 139* 102* 117* 103* 121*  BUN  --   --  177* 177* 126* 92* 95* 106*  CREATININE  --   --  27.50* 26.48* 20.26* 16.65*  17.11* 17.68*  CALCIUM  --   --  6.5* 6.0* 6.6*  6.4* 7.4* 7.4* 7.6*  MG 2.0  --  2.0 1.9 1.7 1.7  --   --   PHOS  --   --  10.4* 9.4* 6.5*  --   --  6.5*   < > = values in this interval not displayed.     Liver Function Tests: Recent Labs  Lab 02/14/21 1611 02/17/21 2152 02/19/21 0208  AST 36 53*  --   ALT 29 41  --   ALKPHOS 79 80  --   BILITOT 0.7 0.5  --   PROT 6.9 6.0*  --   ALBUMIN 3.6 3.0* 2.9*    Recent Labs  Lab 02/14/21 1954 02/15/21 0618  LIPASE 216* 378*    No results for input(s): AMMONIA in the last 168 hours.  CBC: Recent Labs  Lab 02/14/21 1611 02/14/21 2102 02/15/21 0618 02/16/21 0154 02/18/21 0247 02/18/21 2100 02/19/21 0208  WBC 3.2*  --  3.3* 3.6* 3.9*  --  4.0  NEUTROABS 1.6*  --   --   --   --   --   --   HGB 7.8*   < > 7.2* 7.0* 6.4* 7.2* 7.4*  HCT 23.6*   < > 21.4*  20.4* 19.6* 21.5* 22.2*  MCV 80.8  --  79.6* 77.6* 80.7  --  81.0  PLT 82*  --  72* 68* 48*  --  45*   < > = values in this interval not displayed.     Cardiac Enzymes: No results for input(s): CKTOTAL, CKMB, CKMBINDEX, TROPONINI in the last 168 hours.  BNP: Invalid input(s): POCBNP  CBG: Recent Labs  Lab 02/16/21 2019 02/16/21 2308 02/17/21 0455 02/17/21 1931 02/18/21 0045  GLUCAP 108* 129* 100* 141* 103*     Microbiology: Results for orders placed or performed during the hospital encounter of 02/14/21  Resp Panel by RT-PCR (Flu A&B, Covid) Nasopharyngeal Swab     Status: None   Collection Time: 02/14/21  7:17 PM   Specimen: Nasopharyngeal Swab; Nasopharyngeal(NP) swabs in vial transport medium  Result Value Ref Range Status   SARS Coronavirus 2 by RT PCR NEGATIVE NEGATIVE Final    Comment: (NOTE) SARS-CoV-2 target nucleic acids are NOT DETECTED.  The SARS-CoV-2 RNA is generally detectable in upper respiratory specimens during the acute phase of infection. The lowest concentration of SARS-CoV-2 viral copies this assay can detect is 138 copies/mL. A  negative result does not preclude SARS-Cov-2 infection and should not be used as the sole basis for treatment or other patient management decisions. A negative result may occur with  improper specimen collection/handling, submission of specimen other than nasopharyngeal swab, presence of viral mutation(s) within the areas targeted by this assay, and inadequate number of viral copies(<138 copies/mL). A negative result must be combined with clinical observations, patient history, and epidemiological information. The expected result is Negative.  Fact Sheet for Patients:  EntrepreneurPulse.com.au  Fact Sheet for Healthcare Providers:  IncredibleEmployment.be  This test is no t yet approved or cleared by the [place] FDA and  has been authorized for detection and/or diagnosis of SARS-CoV-2 by FDA under an Emergency Use Authorization (EUA). This EUA will remain  in effect (meaning this test can be used) for the duration of the COVID-19 declaration under Section 564(b)(1) of the Act, 21 U.S.C.section 360bbb-3(b)(1), unless the authorization is terminated  or revoked sooner.       Influenza A by PCR NEGATIVE NEGATIVE Final   Influenza B by PCR NEGATIVE NEGATIVE Final    Comment: (NOTE) The Xpert Xpress SARS-CoV-2/FLU/RSV plus assay is intended as an aid in the diagnosis of influenza from Nasopharyngeal swab specimens and should not be used as a sole basis for treatment. Nasal washings and aspirates are unacceptable for Xpert Xpress SARS-CoV-2/FLU/RSV testing.  Fact Sheet for Patients: EntrepreneurPulse.com.au  Fact Sheet for Healthcare Providers: IncredibleEmployment.be  This test is not yet approved or cleared by the [place] FDA and has been authorized for detection and/or diagnosis of SARS-CoV-2 by FDA under an Emergency Use Authorization (EUA). This EUA will remain in effect (meaning this test can  be used) for the duration of the COVID-19 declaration under Section 564(b)(1) of the Act, 21 U.S.C. section 360bbb-3(b)(1), unless the authorization is terminated or revoked.  Performed at [facility], [place] [address] [place], [place] [address]   MRSA Next Gen by PCR, Nasal     Status: None   Collection Time: 02/15/21 12:38 AM   Specimen: Nasal Mucosa; Nasal Swab  Result Value Ref Range Status   MRSA by PCR Next Gen NOT DETECTED NOT DETECTED Final    Comment: (NOTE) The GeneXpert MRSA Assay (FDA approved for NASAL specimens only), is one component of a comprehensive MRSA colonization  surveillance program. It is not intended to diagnose MRSA infection nor to guide or monitor treatment for MRSA infections. Test performance is not FDA approved in patients less than 44 years old. Performed at [facility], [place] [address], [place], [place] [address]     Coagulation Studies: No results for input(s): LABPROT, INR in the last 72 hours.   Urinalysis: No results for input(s): COLORURINE, LABSPEC, PHURINE, GLUCOSEU, HGBUR, BILIRUBINUR, KETONESUR, PROTEINUR, UROBILINOGEN, NITRITE, LEUKOCYTESUR in the last 72 hours.  Invalid input(s): APPERANCEUR    Imaging: No results found.   Medications:      sodium chloride   Intravenous Once   amiodarone  400 mg Oral BID   Followed by   Derrill Memo ON 02/23/2021] amiodarone  200 mg Oral Daily   atorvastatin  40 mg Oral Daily   calcitRIOL  0.25 mcg Oral Daily   calcium acetate  1,334 mg Oral TID WC   calcium carbonate  1 tablet Oral TID WC   carvedilol  25 mg Oral BID WC   Chlorhexidine Gluconate Cloth  6 each Topical Q0600   enoxaparin (LOVENOX) injection  30 mg Subcutaneous Q24H   hydrALAZINE  100 mg Oral Q12H   isosorbide mononitrate  30 mg Oral Daily   sodium chloride flush  10-40 mL Intracatheter Q12H   docusate sodium, heparin sodium (porcine), lidocaine, polyethylene glycol, sodium chloride flush  Assessment/ Plan:   CKD5, 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 -started HD for uremic symptoms.  02/16/2021, 02/17/2021.  Next dialysis be 02/19/2021 -Consulted VVS for permanent access, appreciate assistance.  Clip process       Hypocalcemia resolved.  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  We will start vitamin D.  Continues on calcium supplementation.  Follow renal panel daily   LOS: 5 Sherril Croon '@TODAY''@8'$ :25 AM

## 2021-02-19 NOTE — Progress Notes (Signed)
PROGRESS NOTE    Antonio Keller  [id number] DOB: 1961/06/01 DOA: 02/14/2021 PCP: Jolinda Croak, MD   Brief Narrative:  Antonio Keller ,is a 60 y.o. male with past medical history of end-stage renal disease, not on dialysis, hypertension, who presented to the [facility] ED with a chief complaint of nausea, vomiting, and abdominal pain ongoing for 2 weeks, worse since 1 week.  Became increasingly fatigued so he called EMS on 02/14/2021.   ED course was notable for ABG of 7.22/30/114/12.5, Trop 99, MG 2.0, K 4.1, HGB 8.5, lipase 216. Lactate 0.7, AG 21, BUN 180 While in the ED he developed afib with RVR and was started on an amio gtt. he was admitted under ICU.  Neph and Cardiology were consulted by the ED.  Transferred under [place] on 02/17/2021.  Assessment & Plan:   Active Problems:   Essential hypertension   Nausea and vomiting   New onset atrial fibrillation (HCC)   ESRD (end stage renal disease) (HCC)  End-stage renal disease, newly started on hemodialysis/High anion gap metabolic acidosis: Nephrology following.  Bailey placed on 02/15/2021 by IR.  Received first session of dialysis on 02/16/2021.  VS consulted for permanent access however they prefer his platelets to get better before they do this.  Management per nephrology and we appreciate their help.    Hypokalemia: Defer to nephrology.  Acute hepatitis B infection: Hepatitis B surface antigen positive but patient completely asymptomatic and LFTs normal.  No indication to treat acutely.  Will need follow-up with ID as outpatient.  Paroxysmal new onset atrial fibrillation/Prolonged QT: CHADSVASC=2 (HTN, CHF).  Cardiology on board and managing.  Suspected this is critical illness driven.  Converted back to sinus rhythm.  Currently on amiodarone for total of 28 days.  Heparin is stopped due to some bleeding at the dialysis catheter. Cardiology recommends holding off on heparin for now due to persistent thrombocytopenia.   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  Per cardiology, this is likely secondary to hypertensive cardiomyopathy.  Per cardiology, he is Not a candidate for ACE/ARB/Arni/MRA given ESRD.  However I wonder if he will be a candidate once he will be deemed as permanent dialysis patient.  Currently he is on Coreg 25 mg p.o. twice daily, hydralazine 100 mg p.o. 3 times daily and Imdur 30 mg p.o. daily. Not a candidate for SGLT2 inhibitor currently.   Hypertension: Controlled.  Continue current medications as stated above.   Pancytopenia: Could very well be due to CKD but should seek further work-up for possible other causes.  I am going to consult oncology, I have paged to Dr. Julien Nordmann, waiting for callback  DVT prophylaxis: enoxaparin (LOVENOX) injection 30 mg Start: 02/16/21 1100 SCDs Start: 02/14/21 2244   Code Status: Full Code  Family Communication:  None present at bedside.  Plan of care discussed with patient in length and he verbalized understanding and agreed with it.  Status is: Inpatient  Remains inpatient appropriate because:Inpatient level of care appropriate due to severity of illness  Dispo: The patient is from: Home              Anticipated d/c is to: Home              Patient currently is not medically stable to d/c.   Difficult to place patient No   Estimated body mass index is 24.73 kg/m as calculated from the following:  Height as of 12/19/20: 6' (1.829 m).   Weight as of this encounter: 82.7 kg.     Nutritional Assessment: Body mass index is 24.73 kg/m.Marland Kitchen Seen by dietician.  I agree with the assessment and plan as outlined below: Nutrition Status:   Skin Assessment: I have examined the patient's skin and I agree with the wound assessment as performed by the wound care RN as outlined below:   Consultants:  Nephrology and cardiology  Procedures:  [facility] placement 02/15/2021.  Antimicrobials:   Anti-infectives (From admission, onward)    None          Subjective: Seen and examined in dialysis unit.  He has no complaints.  Objective: Vitals:   02/19/21 0830 02/19/21 0900 02/19/21 0930 02/19/21 1000  BP: (!) 136/93 (!) 138/97 (!) 136/97 (!) 134/92  Pulse: 61 61 62 61  Resp: '12 12 12   '$ Temp:      TempSrc:      SpO2:      Weight:        Intake/Output Summary (Last 24 hours) at 02/19/2021 1032 Last data filed at 02/18/2021 2200 Gross per 24 hour  Intake 644 ml  Output 200 ml  Net 444 ml    Filed Weights   02/17/21 1300 02/17/21 1505 02/18/21 0500  Weight: 83.8 kg 82.7 kg 82.7 kg    Examination:  General exam: Appears calm and comfortable  Respiratory system: Clear to auscultation. Respiratory effort normal. Cardiovascular system: S1 & S2 heard, RRR. No JVD, murmurs, rubs, gallops or clicks. No pedal edema. Gastrointestinal system: Abdomen is nondistended, soft and nontender. No organomegaly or masses felt. Normal bowel sounds heard. Central nervous system: Alert and oriented. No focal neurological deficits. Extremities: Symmetric 5 x 5 power. Skin: No rashes, lesions or ulcers.  Psychiatry: Judgement and insight appear normal. Mood & affect appropriate.    Data Reviewed: I have personally reviewed following labs and imaging studies  CBC: Recent Labs  Lab 02/14/21 1611 02/14/21 2102 02/15/21 0618 02/16/21 0154 02/18/21 0247 02/18/21 2100 02/19/21 0208  WBC 3.2*  --  3.3* 3.6* 3.9*  --  4.0  NEUTROABS 1.6*  --   --   --   --   --   --   HGB 7.8*   < > 7.2* 7.0* 6.4* 7.2* 7.4*  HCT 23.6*   < > 21.4* 20.4* 19.6* 21.5* 22.2*  MCV 80.8  --  79.6* 77.6* 80.7  --  81.0  PLT 82*  --  72* 68* 48*  --  45*   < > = values in this interval not displayed.    Basic Metabolic Panel: Recent Labs  Lab 02/14/21 1954 02/14/21 2102 02/15/21 0618 02/16/21 0154 02/17/21 0008 02/17/21 2152 02/18/21 0247 02/19/21 0208  NA  --    < > 135 136 134* 135 135  135  K  --    < > 3.5 3.0* 3.0* 3.0* 3.1* 3.2*  CL  --   --  99 96* 94* 99 95* 97*  CO2  --   --  16* 19* '22 23 23 23  '$ GLUCOSE  --   --  129* 139* 102* 117* 103* 121*  BUN  --   --  177* 177* 126* 92* 95* 106*  CREATININE  --   --  27.50* 26.48* 20.26* 16.65* 17.11* 17.68*  CALCIUM  --   --  6.5* 6.0* 6.6*  6.4* 7.4* 7.4* 7.6*  MG 2.0  --  2.0 1.9 1.7 1.7  --   --  PHOS  --   --  10.4* 9.4* 6.5*  --   --  6.5*   < > = values in this interval not displayed.    GFR: Estimated Creatinine Clearance: 4.9 mL/min (A) (by C-G formula based on SCr of 17.68 mg/dL (H)). Liver Function Tests: Recent Labs  Lab 02/14/21 1611 02/17/21 2152 02/19/21 0208  AST 36 53*  --   ALT 29 41  --   ALKPHOS 79 80  --   BILITOT 0.7 0.5  --   PROT 6.9 6.0*  --   ALBUMIN 3.6 3.0* 2.9*    Recent Labs  Lab 02/14/21 1954 02/15/21 0618  LIPASE 216* 378*    No results for input(s): AMMONIA in the last 168 hours. Coagulation Profile: Recent Labs  Lab 02/14/21 1954  INR 1.3*    Cardiac Enzymes: No results for input(s): CKTOTAL, CKMB, CKMBINDEX, TROPONINI in the last 168 hours. BNP (last 3 results) No results for input(s): PROBNP in the last 8760 hours. HbA1C: No results for input(s): HGBA1C in the last 72 hours. CBG: Recent Labs  Lab 02/16/21 2019 02/16/21 2308 02/17/21 0455 02/17/21 1931 02/18/21 0045  GLUCAP 108* 129* 100* 141* 103*    Lipid Profile: No results for input(s): CHOL, HDL, LDLCALC, TRIG, CHOLHDL, LDLDIRECT in the last 72 hours.  Thyroid Function Tests: No results for input(s): TSH, T4TOTAL, FREET4, T3FREE, THYROIDAB in the last 72 hours. Anemia Panel: No results for input(s): VITAMINB12, FOLATE, FERRITIN, TIBC, IRON, RETICCTPCT in the last 72 hours. Sepsis Labs: Recent Labs  Lab 02/14/21 1611  LATICACIDVEN 0.7     Recent Results (from the past 240 hour(s))  Resp Panel by RT-PCR (Flu A&B, Covid) Nasopharyngeal Swab     Status: None   Collection Time: 02/14/21   7:17 PM   Specimen: Nasopharyngeal Swab; Nasopharyngeal(NP) swabs in vial transport medium  Result Value Ref Range Status   SARS Coronavirus 2 by RT PCR NEGATIVE NEGATIVE Final    Comment: (NOTE) SARS-CoV-2 target nucleic acids are NOT DETECTED.  The SARS-CoV-2 RNA is generally detectable in upper respiratory specimens during the acute phase of infection. The lowest concentration of SARS-CoV-2 viral copies this assay can detect is 138 copies/mL. A negative result does not preclude SARS-Cov-2 infection and should not be used as the sole basis for treatment or other patient management decisions. A negative result may occur with  improper specimen collection/handling, submission of specimen other than nasopharyngeal swab, presence of viral mutation(s) within the areas targeted by this assay, and inadequate number of viral copies(<138 copies/mL). A negative result must be combined with clinical observations, patient history, and epidemiological information. The expected result is Negative.  Fact Sheet for Patients:  EntrepreneurPulse.com.au  Fact Sheet for Healthcare Providers:  IncredibleEmployment.be  This test is no t yet approved or cleared by the [place] FDA and  has been authorized for detection and/or diagnosis of SARS-CoV-2 by FDA under an Emergency Use Authorization (EUA). This EUA will remain  in effect (meaning this test can be used) for the duration of the COVID-19 declaration under Section 564(b)(1) of the Act, 21 U.S.C.section 360bbb-3(b)(1), unless the authorization is terminated  or revoked sooner.       Influenza A by PCR NEGATIVE NEGATIVE Final   Influenza B by PCR NEGATIVE NEGATIVE Final    Comment: (NOTE) The Xpert Xpress SARS-CoV-2/FLU/RSV plus assay is intended as an aid in the diagnosis of influenza from Nasopharyngeal swab specimens and should not be used as a sole basis for  treatment. Nasal washings and aspirates  are unacceptable for Xpert Xpress SARS-CoV-2/FLU/RSV testing.  Fact Sheet for Patients: EntrepreneurPulse.com.au  Fact Sheet for Healthcare Providers: IncredibleEmployment.be  This test is not yet approved or cleared by the [place] FDA and has been authorized for detection and/or diagnosis of SARS-CoV-2 by FDA under an Emergency Use Authorization (EUA). This EUA will remain in effect (meaning this test can be used) for the duration of the COVID-19 declaration under Section 564(b)(1) of the Act, 21 U.S.C. section 360bbb-3(b)(1), unless the authorization is terminated or revoked.  Performed at [facility], [place] [address] [place], [place] [address]   MRSA Next Gen by PCR, Nasal     Status: None   Collection Time: 02/15/21 12:38 AM   Specimen: Nasal Mucosa; Nasal Swab  Result Value Ref Range Status   MRSA by PCR Next Gen NOT DETECTED NOT DETECTED Final    Comment: (NOTE) The GeneXpert MRSA Assay (FDA approved for NASAL specimens only), is one component of a comprehensive MRSA colonization surveillance program. It is not intended to diagnose MRSA infection nor to guide or monitor treatment for MRSA infections. Test performance is not FDA approved in patients less than 74 years old. Performed at [facility], [place] [address] [place], [place] [address]        Radiology Studies: No results found.  Scheduled Meds:  sodium chloride   Intravenous Once   amiodarone  400 mg Oral BID   Followed by   Derrill Memo ON 02/23/2021] amiodarone  200 mg Oral Daily   atorvastatin  40 mg Oral Daily   calcitRIOL  0.25 mcg Oral Daily   calcium acetate  1,334 mg Oral TID WC   calcium carbonate  1 tablet Oral TID WC   carvedilol  25 mg Oral BID WC   Chlorhexidine Gluconate Cloth  6 each Topical Q0600   enoxaparin (LOVENOX) injection  30 mg Subcutaneous Q24H   hydrALAZINE  100 mg Oral Q12H   isosorbide mononitrate  30 mg Oral Daily   sodium  chloride flush  10-40 mL Intracatheter Q12H   Continuous Infusions:     LOS: 5 days   Time spent: 28 minutes   Darliss Cheney, MD [facility]  02/19/2021, 10:32 AM  Please page via Shea [facility] and do not message via secure chat for anything urgent. Secure chat can be used for anything non urgent.  How to contact the [facility] Attending or Consulting provider [place] or covering provider during after hours [place], for this patient?  Check the care team in [facility] and look for a) attending/consulting [facility] provider listed and b) the [facility] team listed. Page or secure chat [phone]. Log into [email or web address] and use [place]'s universal password to access. If you do not have the password, please contact the hospital operator. Locate the [facility] provider you are looking for under [facility] and page to a number that you can be directly reached. If you still have difficulty reaching the provider, please page the [facility] (Director on Call) for the Hospitalists listed on amion for assistance.

## 2021-02-19 NOTE — Consult Note (Signed)
[place] [phone]   [phone]  CONSULT NOTE  REFERRING PHYSICIAN: Dr. Darliss Cheney  REASON FOR CONSULTATION:  60 years old African male with pancytopenia  HPI Antonio Keller is a 60 y.o. male with past medical history significant for hypertension and end-stage renal disease currently on hemodialysis.  The patient was admitted to [facility] from the emergency department complaining of nausea and vomiting as well as abdominal pain.  He has a history of chronic kidney disease seen by nephrology in the past but he declined dialysis.  He also has a history of uncontrolled hypertension.  His condition was getting worse and he presented to the emergency department for further evaluation.  He finally agreed to proceed with dialysis.  During his evaluation he was found to have pancytopenia with total white blood count of 3.2, hemoglobin 7.8 and hematocrit 23.6% and platelets count 82,000.  He has similar problems with anemia and thrombocytopenia in the past.  His total white blood count has been fluctuating but mostly normal before his admission.  His hepatitis B surface antigen was reactive on 02/16/2021.  He was tested for HIV in the past and this was reported to be negative.  I was consulted for evaluation of his anemia and thrombocytopenia. When seen today the patient is feeling fine except for fatigue.  He just came from dialysis. He denied having any current chest pain, shortness of breath, cough or hemoptysis.  He has no current nausea, vomiting, diarrhea or constipation.  He has no fever or chills. Family history is unremarkable for any malignancy or blood disorder. Social history the patient is married and has 2 children 1 in the [place] and the other 1 in [place].  He is originally from [place] and has been in the [place] for the last 10-12 years.  He is working in an Engineer, building services.  He has no history for smoking, alcohol or drug  abuse.  HPI  Past Medical History:  Diagnosis Date   Hypertension    Renal disorder     Past Surgical History:  Procedure Laterality Date   IR FLUORO GUIDE CV LINE RIGHT  02/15/2021   IR US GUIDE VASC ACCESS RIGHT  02/15/2021    No family history on file.  Social History Social History   Tobacco Use   Smoking status: Never   Smokeless tobacco: Never  Substance Use Topics   Alcohol use: Not Currently   Drug use: Never    No Known Allergies  Current Facility-Administered Medications  Medication Dose Route Frequency Provider Last Rate Last Admin   0.9 %  sodium chloride infusion (Manually program via Guardrails IV Fluids)   Intravenous Once Darliss Cheney, MD       amiodarone (PACERONE) tablet 400 mg  400 mg Oral BID Geralynn Rile, MD   400 mg at 02/19/21 1258   Followed by   Derrill Memo ON 02/23/2021] amiodarone (PACERONE) tablet 200 mg  200 mg Oral Daily O'Neal, Cassie Freer, MD       atorvastatin (LIPITOR) tablet 40 mg  40 mg Oral Daily O'Neal, Cassie Freer, MD   40 mg at 02/19/21 1259   calcitRIOL (ROCALTROL) capsule 0.25 mcg  0.25 mcg Oral Daily Edrick Oh, MD   0.25 mcg at 02/19/21 1300   calcium acetate (PHOSLO) capsule 1,334 mg  1,334 mg Oral TID WC Gean Quint, MD   1,334 mg at 02/19/21 1259   calcium carbonate (OS-CAL - dosed in mg  of elemental calcium) tablet 500 mg of elemental calcium  1 tablet Oral TID WC Jacky Kindle, MD   500 mg of elemental calcium at 02/19/21 1258   carvedilol (COREG) tablet 25 mg  25 mg Oral BID WC Jacky Kindle, MD   25 mg at 02/18/21 1609   Chlorhexidine Gluconate Cloth 2 % PADS 6 each  6 each Topical Q0600 Edrick Oh, MD   6 each at 02/18/21 1740   docusate sodium (COLACE) capsule 100 mg  100 mg Oral BID PRN Estill Cotta, NP       enoxaparin (LOVENOX) injection 30 mg  30 mg Subcutaneous Q24H Jacky Kindle, MD   30 mg at 02/19/21 1300   heparin sodium (porcine) 1000 UNIT/ML injection            heparin sodium (porcine)  injection    PRN Michaelle Birks, MD   3,800 Units at 02/15/21 1320   hydrALAZINE (APRESOLINE) tablet 100 mg  100 mg Oral Q12H Jacky Kindle, MD   100 mg at 02/19/21 1259   isosorbide mononitrate (IMDUR) 24 hr tablet 30 mg  30 mg Oral Daily Geralynn Rile, MD   30 mg at 02/19/21 1259   lidocaine (XYLOCAINE) 1 % (with pres) injection    PRN Michaelle Birks, MD   10 mL at 02/15/21 1312   polyethylene glycol (MIRALAX / GLYCOLAX) packet 17 g  17 g Oral Daily PRN Estill Cotta, NP       sodium chloride flush (NS) 0.9 % injection 10-40 mL  10-40 mL Intracatheter Q12H Jacky Kindle, MD   10 mL at 02/19/21 1300   sodium chloride flush (NS) 0.9 % injection 10-40 mL  10-40 mL Intracatheter PRN Jacky Kindle, MD        Review of Systems  Constitutional: positive for fatigue Eyes: negative Ears, nose, mouth, throat, and face: negative Respiratory: negative Cardiovascular: negative Gastrointestinal: negative Genitourinary:negative Integument/breast: negative Hematologic/lymphatic: negative Musculoskeletal:negative Neurological: negative Behavioral/Psych: negative Endocrine: negative Allergic/Immunologic: negative  Physical Exam  TKP:TWSFK, healthy, no distress, well nourished, and well developed SKIN: skin color, texture, turgor are normal, no rashes or significant lesions HEAD: Normocephalic, No masses, lesions, tenderness or abnormalities EYES: normal, PERRLA, Conjunctiva are pink and non-injected EARS: External ears normal, Canals clear OROPHARYNX:no exudate, no erythema, and lips, buccal mucosa, and tongue normal  NECK: supple, no adenopathy, no JVD LYMPH:  no palpable lymphadenopathy, no hepatosplenomegaly LUNGS: clear to auscultation , and palpation HEART: regular rate & rhythm, no murmurs, and no gallops ABDOMEN:abdomen soft, non-tender, normal bowel sounds, and no masses or organomegaly BACK: No CVA tenderness, Range of motion is normal EXTREMITIES:no joint deformities,  effusion, or inflammation, no edema  NEURO: alert & oriented x 3 with fluent speech, no focal motor/sensory deficits  PERFORMANCE STATUS: ECOG 1  LABORATORY DATA: Lab Results  Component Value Date   WBC 4.0 02/19/2021   HGB 7.4 (L) 02/19/2021   HCT 22.2 (L) 02/19/2021   MCV 81.0 02/19/2021   PLT 45 (L) 02/19/2021    @LASTCHEM @  RADIOGRAPHIC STUDIES: DG Chest 2 View  Result Date: 02/14/2021 CLINICAL DATA:  Shortness of breath EXAM: CHEST - 2 VIEW COMPARISON:  Chest x-ray dated November 18, 2020 FINDINGS: Inferior cardiophrenic angles are excluded from the field-of-view on AP image. Unchanged cardiomegaly and tortuosity of the thoracic aorta. Lungs are clear. No evidence of pleural effusion or pneumothorax. IMPRESSION: No active cardiopulmonary disease. Electronically Signed   By: Yetta Glassman M.D.   On: 02/14/2021 17:03  IR Fluoro Guide CV Line Right  Result Date: 02/15/2021 INDICATION: ESRD requiring HD. EXAM: TUNNELED CENTRAL VENOUS HEMODIALYSIS CATHETER PLACEMENT WITH ULTRASOUND AND FLUOROSCOPIC GUIDANCE MEDICATIONS: None. ANESTHESIA/SEDATION: Moderate (conscious) sedation was employed during this procedure. A total of Versed 1 mg and Fentanyl 50 mcg was administered intravenously. Moderate Sedation Time: 22 minutes. The patient's level of consciousness and vital signs were monitored continuously by radiology nursing throughout the procedure under my direct supervision. FLUOROSCOPY TIME:  0 minutes 18 seconds (1 mGy). COMPLICATIONS: None immediate. PROCEDURE: Informed written consent was obtained from the patient after a discussion of the risks, benefits, and alternatives to treatment. Questions regarding the procedure were encouraged and answered. The right neck and chest were prepped with chlorhexidine in a sterile fashion, and a sterile drape was applied covering the operative field. Maximum barrier sterile technique with sterile gowns and gloves were used for the procedure. A timeout  was performed prior to the initiation of the procedure. After creating a small venotomy incision, a micropuncture kit was utilized to access the internal jugular vein. Real-time ultrasound guidance was utilized for vascular access including the acquisition of a permanent ultrasound image documenting patency of the accessed vessel. The microwire was utilized to measure appropriate catheter length. A stiff Glidewire was advanced to the level of the IVC and the micropuncture sheath was exchanged for a peel-away sheath. A tunneled hemodialysis catheter measuring 23 cm from tip to cuff was tunneled in a retrograde fashion from the anterior chest wall to the venotomy incision. The catheter was then placed through the peel-away sheath with tips ultimately positioned within the superior aspect of the right atrium. Final catheter positioning was confirmed and documented with a spot radiographic image. The catheter aspirates and flushes normally. The catheter was flushed with appropriate volume heparin dwells. The catheter exit site was secured with a 2-0 nylon retention suture. The venotomy incision was closed with Dermabond. Dressings were applied. The patient tolerated the procedure well without immediate post procedural complication. IMPRESSION: Successful placement of 23 cm tip to cuff tunneled hemodialysis catheter via the right internal jugular vein with tips terminating within the superior aspect of the right atrium. The catheter is ready for immediate use. Michaelle Birks, MD [facility] Electronically Signed   By: Michaelle Birks M.D.   On: 02/15/2021 13:46   IR US Guide Vasc Access Right  Result Date: 02/15/2021 INDICATION: ESRD requiring HD. EXAM: TUNNELED CENTRAL VENOUS HEMODIALYSIS CATHETER PLACEMENT WITH ULTRASOUND AND FLUOROSCOPIC GUIDANCE MEDICATIONS: None. ANESTHESIA/SEDATION: Moderate (conscious) sedation was employed during this procedure. A total of  Versed 1 mg and Fentanyl 50 mcg was administered intravenously. Moderate Sedation Time: 22 minutes. The patient's level of consciousness and vital signs were monitored continuously by radiology nursing throughout the procedure under my direct supervision. FLUOROSCOPY TIME:  0 minutes 18 seconds (1 mGy). COMPLICATIONS: None immediate. PROCEDURE: Informed written consent was obtained from the patient after a discussion of the risks, benefits, and alternatives to treatment. Questions regarding the procedure were encouraged and answered. The right neck and chest were prepped with chlorhexidine in a sterile fashion, and a sterile drape was applied covering the operative field. Maximum barrier sterile technique with sterile gowns and gloves were used for the procedure. A timeout was performed prior to the initiation of the procedure. After creating a small venotomy incision, a micropuncture kit was utilized to access the internal jugular vein. Real-time ultrasound guidance was utilized for vascular access including the acquisition of a permanent ultrasound  image documenting patency of the accessed vessel. The microwire was utilized to measure appropriate catheter length. A stiff Glidewire was advanced to the level of the IVC and the micropuncture sheath was exchanged for a peel-away sheath. A tunneled hemodialysis catheter measuring 23 cm from tip to cuff was tunneled in a retrograde fashion from the anterior chest wall to the venotomy incision. The catheter was then placed through the peel-away sheath with tips ultimately positioned within the superior aspect of the right atrium. Final catheter positioning was confirmed and documented with a spot radiographic image. The catheter aspirates and flushes normally. The catheter was flushed with appropriate volume heparin dwells. The catheter exit site was secured with a 2-0 nylon retention suture. The venotomy incision was closed with Dermabond. Dressings were applied. The  patient tolerated the procedure well without immediate post procedural complication. IMPRESSION: Successful placement of 23 cm tip to cuff tunneled hemodialysis catheter via the right internal jugular vein with tips terminating within the superior aspect of the right atrium. The catheter is ready for immediate use. Michaelle Birks, MD [facility] Electronically Signed   By: Michaelle Birks M.D.   On: 02/15/2021 13:46   DG Chest Port 1 View  Result Date: 02/14/2021 CLINICAL DATA:  Chest pain EXAM: PORTABLE CHEST 1 VIEW COMPARISON:  02/14/2021 FINDINGS: Mild cardiomegaly. No focal opacity or pleural effusion. Aortic atherosclerosis. No pneumothorax IMPRESSION: No active disease.  Mild cardiomegaly Electronically Signed   By: Donavan Foil M.D.   On: 02/14/2021 23:19   ECHOCARDIOGRAM COMPLETE  Result Date: 02/15/2021    ECHOCARDIOGRAM REPORT   Patient Name:   Antonio Keller Date of Exam: 02/15/2021 Medical Rec #:  [id number]     Height:       72.0 in Accession #:    [id number]    Weight:       176.8 lb Date of Birth:  23-Jun-1960     BSA:          2.022 m Patient Age:    82 years      BP:           141/99 mmHg Patient Gender: M             HR:           57 bpm. Exam Location:  Inpatient Procedure: 2D Echo, 3D Echo, Cardiac Doppler and Color Doppler Indications:    Atrial fibrillation  History:        Patient has no prior history of Echocardiogram examinations.                 Risk Factors:Hypertension. ESRD.  Sonographer:    Clayton Lefort RDCS (AE) Referring Phys: [id number] [place]  1. Left ventricular ejection fraction, by estimation, is 40 to 45%. The left ventricle has mildly decreased function. The left ventricle has no regional wall motion abnormalities. There is severe concentric left ventricular hypertrophy. Left ventricular  diastolic parameters are consistent with Grade II diastolic dysfunction (pseudonormalization). Elevated left  ventricular end-diastolic pressure.  2. Right ventricular systolic function is normal. The right ventricular size is normal. Tricuspid regurgitation signal is inadequate for assessing PA pressure.  3. Left atrial size was severely dilated.  4. Right atrial size was mildly dilated.  5. The mitral valve is grossly normal. Mild mitral valve regurgitation.  6. The aortic valve is tricuspid. There is mild thickening of the aortic valve. Aortic valve regurgitation is trivial. No aortic stenosis is present. Aortic  valve mean gradient measures 6.0 mmHg.  7. Aortic dilatation noted. There is mild dilatation of the aortic root, measuring 41 mm.  8. The inferior vena cava is normal in size with <50% respiratory variability, suggesting right atrial pressure of 8 mmHg. Comparison(s): No prior Echocardiogram. FINDINGS  Left Ventricle: Left ventricular ejection fraction, by estimation, is 40 to 45%. The left ventricle has mildly decreased function. The left ventricle has no regional wall motion abnormalities. The left ventricular internal cavity size was normal in size. There is severe concentric left ventricular hypertrophy. Left ventricular diastolic parameters are consistent with Grade II diastolic dysfunction (pseudonormalization). Elevated left ventricular end-diastolic pressure. Right Ventricle: The right ventricular size is normal. No increase in right ventricular wall thickness. Right ventricular systolic function is normal. Tricuspid regurgitation signal is inadequate for assessing PA pressure. Left Atrium: Left atrial size was severely dilated. Right Atrium: Right atrial size was mildly dilated. Pericardium: There is no evidence of pericardial effusion. Mitral Valve: The mitral valve is grossly normal. Mild mitral valve regurgitation. MV peak gradient, 6.6 mmHg. The mean mitral valve gradient is 2.0 mmHg. Tricuspid Valve: The tricuspid valve is grossly normal. Tricuspid valve regurgitation is trivial. Aortic Valve: The  aortic valve is tricuspid. There is mild thickening of the aortic valve. There is mild aortic valve annular calcification. Aortic valve regurgitation is trivial. No aortic stenosis is present. Aortic valve mean gradient measures 6.0 mmHg. Aortic valve peak gradient measures 9.0 mmHg. Aortic valve area, by VTI measures 2.31 cm. Pulmonic Valve: The pulmonic valve was grossly normal. Pulmonic valve regurgitation is trivial. Aorta: Aortic dilatation noted. There is mild dilatation of the aortic root, measuring 41 mm. Venous: The inferior vena cava is normal in size with less than 50% respiratory variability, suggesting right atrial pressure of 8 mmHg. IAS/Shunts: No atrial level shunt detected by color flow Doppler.  LEFT VENTRICLE PLAX 2D LVIDd:         5.60 cm      Diastology LVIDs:         4.30 cm      LV e' medial:    4.95 cm/s LV PW:         2.00 cm      LV E/e' medial:  23.6 LV IVS:        2.00 cm      LV e' lateral:   10.10 cm/s LVOT diam:     2.30 cm      LV E/e' lateral: 11.6 LV SV:         81 LV SV Index:   40 LVOT Area:     4.15 cm                              3D Volume EF: LV Volumes (MOD)            3D EF:        45 % LV vol d, MOD A2C: 190.0 ml LV EDV:       232 ml LV vol d, MOD A4C: 166.0 ml LV ESV:       127 ml LV vol s, MOD A2C: 101.0 ml LV SV:        105 ml LV vol s, MOD A4C: 93.5 ml LV SV MOD A2C:     89.0 ml LV SV MOD A4C:     166.0 ml LV SV MOD BP:      88.2 ml RIGHT VENTRICLE  IVC RV Basal diam:  4.00 cm     IVC diam: 2.00 cm RV Mid diam:    2.20 cm RV S prime:     11.90 cm/s TAPSE (M-mode): 3.0 cm LEFT ATRIUM              Index       RIGHT ATRIUM           Index LA diam:        4.30 cm  2.13 cm/m  RA Area:     22.30 cm LA Vol (A2C):   116.0 ml 57.37 ml/m RA Volume:   70.00 ml  34.62 ml/m LA Vol (A4C):   115.0 ml 56.88 ml/m LA Biplane Vol: 120.0 ml 59.35 ml/m  AORTIC VALVE AV Area (Vmax):    2.39 cm AV Area (Vmean):   2.21 cm AV Area (VTI):     2.31 cm AV Vmax:            150.00 cm/s AV Vmean:          115.000 cm/s AV VTI:            0.350 m AV Peak Grad:      9.0 mmHg AV Mean Grad:      6.0 mmHg LVOT Vmax:         86.20 cm/s LVOT Vmean:        61.100 cm/s LVOT VTI:          0.195 m LVOT/AV VTI ratio: 0.56  AORTA Ao Root diam: 4.10 cm Ao Asc diam:  3.70 cm MITRAL VALVE MV Area (PHT): 2.38 cm     SHUNTS MV Area VTI:   2.19 cm     Systemic VTI:  0.20 m MV Peak grad:  6.6 mmHg     Systemic Diam: 2.30 cm MV Mean grad:  2.0 mmHg MV Vmax:       1.28 m/s MV Vmean:      64.6 cm/s MV Decel Time: 319 msec MR Peak grad: 104.9 mmHg MR Mean grad: 68.0 mmHg MR Vmax:      512.00 cm/s MR Vmean:     387.0 cm/s MV E velocity: 117.00 cm/s MV A velocity: 58.70 cm/s MV E/A ratio:  1.99 Rozann Lesches MD Electronically signed by Rozann Lesches MD Signature Date/Time: 02/15/2021/3:01:03 PM    Final    VAS [place] UPPER EXT VEIN MAPPING (PRE-OP AVF)  Result Date: 02/16/2021 [place] MAPPING Patient Name:  Antonio Keller  Date of Exam:   02/16/2021 Medical Rec #: [id number]      Accession #:    [id number] Date of Birth: 17-Mar-1961      Patient Gender: M Patient Age:   25 years Exam Location:  [facility] Procedure:      VAS [place] UPPER EXT VEIN MAPPING (PRE-OP AVF) Referring Phys: JOSHUA ROBINS --------------------------------------------------------------------------------  Indications: Pre-access. Comparison Study: no prior Performing Technologist: Archie Patten RVS  Examination Guidelines: A complete evaluation includes B-mode imaging, spectral Doppler, color Doppler, and power Doppler as needed of all accessible portions of each vessel. Bilateral testing is considered an integral part of a complete examination. Limited examinations for reoccurring indications may be performed as noted. +-----------------+-------------+----------+---------+ Right Cephalic   Diameter (cm)Depth (cm)Findings  +-----------------+-------------+----------+---------+ Shoulder             0.13        0.24              +-----------------+-------------+----------+---------+ Prox upper arm  0.21        0.23             +-----------------+-------------+----------+---------+ Mid upper arm        0.22        0.35   branching +-----------------+-------------+----------+---------+ Dist upper arm       0.29        0.48             +-----------------+-------------+----------+---------+ Antecubital fossa    0.28        0.24             +-----------------+-------------+----------+---------+ Prox forearm         0.22        0.19             +-----------------+-------------+----------+---------+ Mid forearm          0.18        0.19             +-----------------+-------------+----------+---------+ Dist forearm         0.13        0.35             +-----------------+-------------+----------+---------+ Wrist                0.17        0.31             +-----------------+-------------+----------+---------+ +-----------------+-------------+----------+--------+ Right Basilic    Diameter (cm)Depth (cm)Findings +-----------------+-------------+----------+--------+ Prox upper arm       0.42        0.74            +-----------------+-------------+----------+--------+ Mid upper arm        0.32        0.72            +-----------------+-------------+----------+--------+ Dist upper arm       0.32        0.62            +-----------------+-------------+----------+--------+ Antecubital fossa    0.30        0.20            +-----------------+-------------+----------+--------+ Prox forearm         0.26        0.27            +-----------------+-------------+----------+--------+ Mid forearm          0.13        0.27            +-----------------+-------------+----------+--------+ Distal forearm       0.13        0.19            +-----------------+-------------+----------+--------+ +-----------------+-------------+----------+--------+ Left Cephalic    Diameter  (cm)Depth (cm)Findings +-----------------+-------------+----------+--------+ Shoulder             0.31        0.27            +-----------------+-------------+----------+--------+ Prox upper arm       0.31        0.26            +-----------------+-------------+----------+--------+ Mid upper arm        0.24        0.26            +-----------------+-------------+----------+--------+ Dist upper arm       0.30        0.23            +-----------------+-------------+----------+--------+ Antecubital fossa    0.33  0.35            +-----------------+-------------+----------+--------+ Prox forearm         0.25        0.32            +-----------------+-------------+----------+--------+ Mid forearm          0.15        0.27            +-----------------+-------------+----------+--------+ Dist forearm         0.20        0.43            +-----------------+-------------+----------+--------+ Wrist                0.19        0.35            +-----------------+-------------+----------+--------+ +-----------------+-------------+----------+--------+ Left Basilic     Diameter (cm)Depth (cm)Findings +-----------------+-------------+----------+--------+ Prox upper arm       0.36        0.55            +-----------------+-------------+----------+--------+ Mid upper arm        0.36        0.52            +-----------------+-------------+----------+--------+ Dist upper arm       0.35        0.46            +-----------------+-------------+----------+--------+ Antecubital fossa    0.21        0.25            +-----------------+-------------+----------+--------+ Prox forearm         0.15        0.33            +-----------------+-------------+----------+--------+ Mid forearm          0.15        0.20            +-----------------+-------------+----------+--------+ Distal forearm       0.14        0.15             +-----------------+-------------+----------+--------+ *See table(s) above for measurements and observations.  Diagnosing physician: Orlie Pollen Electronically signed by Orlie Pollen on 02/16/2021 at 2:56:24 PM.    Final    US Abdomen Limited RUQ (LIVER/GB)  Result Date: 02/14/2021 CLINICAL DATA:  Epigastric pain EXAM: ULTRASOUND ABDOMEN LIMITED RIGHT UPPER QUADRANT COMPARISON:  Ultrasound 12/18/2020 FINDINGS: Gallbladder: No shadowing stones. Possible mild focal wall thickening up to 3.5 mm. Negative sonographic [place]. Trace pericholecystic fluid Common bile duct: Diameter: 4.5 mm Liver: No focal lesion identified. Within normal limits in parenchymal echogenicity. Portal vein is patent on color Doppler imaging with normal direction of blood flow towards the liver. Other: Echogenic right kidney consistent with medical renal disease. IMPRESSION: 1. Negative for gallstones. There may be mild focal wall thickening but there is a negative sonographic Murphy. Trace fluid adjacent to gallbladder. Findings not strongly suggestive of acute gallbladder disease; if continued concern, nuclear medicine hepatobiliary imaging may be obtained. 2. Echogenic right kidney consistent with medical renal disease Electronically Signed   By: Donavan Foil M.D.   On: 02/14/2021 23:57    ASSESSMENT: This is a very pleasant 60 years old African male with mainly anemia and thrombocytopenia.  His leukocyte count are within the low normal range.  His anemia and partially thrombocytopenia can be definitely contributed to his end-stage renal disease as well as the other comorbidities including hepatitis B.  PLAN: I will order several studies today for further evaluation of his condition including repeating iron study, ferritin, vitamin B12 level, serum folate, LDH, haptoglobin, serum protein electrophoresis with immunofixation, DIC panel, reticulocyte count, erythropoietin, ANA and rheumatoid factor.  I will also order peripheral  blood smear for pathology review. If the above blood work is unremarkable, I may consider The patient for a bone marrow biopsy and aspirate to rule out any underlying bone marrow abnormality contributing to his anemia and thrombocytopenia. For the end-stage renal disease, he will continue on hemodialysis. Will continue with the supportive care for now including PRBCs transfusion if his hemoglobin is less than 7.0 or platelets count less than 20,000 unless the patient has any bleeding issues, would consider it at the higher number. The patient voices understanding of current disease status and treatment options and is in agreement with the current care plan.  All questions were answered. The patient knows to call the clinic with any problems, questions or concerns. We can certainly see the patient much sooner if necessary.  Thank you so much for allowing me to participate in the care of [facility]. I will continue to follow up the patient with you and assist in his care.  Disclaimer: This note was dictated with voice recognition software. Similar sounding words can inadvertently be transcribed and may not be corrected upon review.   Eilleen Kempf February 19, 2021, 4:06 PM

## 2021-02-19 NOTE — Progress Notes (Signed)
Patient has a seat at [place] 12:00 arrival with chair time of 12:20. Case Manager and NP notified. Patient can start on Friday

## 2021-02-19 NOTE — Progress Notes (Signed)
Cardiology Progress Note  Patient ID: Antonio Keller MRN: [id number] DOB: 01-Sep-1960 Date of Encounter: 02/19/2021  Primary Cardiologist: None  Subjective   Hemoglobin improved to 7.4 today after 1 unit PRBCs.  Platelet count 45.  Denies any chest pain or dyspnea.  Inpatient Medications  Scheduled Meds:  sodium chloride   Intravenous Once   amiodarone  400 mg Oral BID   Followed by   Derrill Memo ON 02/23/2021] amiodarone  200 mg Oral Daily   atorvastatin  40 mg Oral Daily   calcitRIOL  0.25 mcg Oral Daily   calcium acetate  1,334 mg Oral TID WC   calcium carbonate  1 tablet Oral TID WC   carvedilol  25 mg Oral BID WC   Chlorhexidine Gluconate Cloth  6 each Topical Q0600   enoxaparin (LOVENOX) injection  30 mg Subcutaneous Q24H   hydrALAZINE  100 mg Oral Q12H   isosorbide mononitrate  30 mg Oral Daily   sodium chloride flush  10-40 mL Intracatheter Q12H   Continuous Infusions:   PRN Meds: docusate sodium, heparin sodium (porcine), lidocaine, polyethylene glycol, sodium chloride flush   Vital Signs   Vitals:   02/19/21 0800 02/19/21 0830 02/19/21 0900 02/19/21 0930  BP: 135/85 (!) 136/93 (!) 138/97 (!) 136/97  Pulse: 61 61 61 62  Resp: '10 12 12 12  '$ Temp: 98.4 F (36.9 C)     TempSrc: Temporal     SpO2:      Weight:        Intake/Output Summary (Last 24 hours) at 02/19/2021 0944 Last data filed at 02/18/2021 2200 Gross per 24 hour  Intake 644 ml  Output 200 ml  Net 444 ml    Last 3 Weights 02/18/2021 02/17/2021 02/17/2021  Weight (lbs) 182 lb 5.1 oz 182 lb 5.1 oz 184 lb 11.9 oz  Weight (kg) 82.7 kg 82.7 kg 83.8 kg      Telemetry  NSR in 60s, intermittent Afib which I personally reviewed.   ECG  NSR, rate 63, LVH with repolarization abnormalities, QTc 523 which I personally reviewed.   Physical Exam   Vitals:   02/19/21 0800 02/19/21 0830 02/19/21 0900 02/19/21 0930  BP: 135/85 (!) 136/93 (!) 138/97 (!) 136/97  Pulse: 61 61 61 62  Resp: '10 12 12 12  '$ Temp:  98.4 F (36.9 C)     TempSrc: Temporal     SpO2:      Weight:        Intake/Output Summary (Last 24 hours) at 02/19/2021 0944 Last data filed at 02/18/2021 2200 Gross per 24 hour  Intake 644 ml  Output 200 ml  Net 444 ml     Last 3 Weights 02/18/2021 02/17/2021 02/17/2021  Weight (lbs) 182 lb 5.1 oz 182 lb 5.1 oz 184 lb 11.9 oz  Weight (kg) 82.7 kg 82.7 kg 83.8 kg    Body mass index is 24.73 kg/m.   General: Well nourished, well developed, in no acute distress Head: Atraumatic, normal size  Eyes: PEERLA, EOMI  Neck: Supple, no JVD Cardiac: Normal S1, S2; RRR; no murmurs, rubs, or gallops Lungs: CTAB Abd: Soft, nontender, no hepatomegaly  Ext: No edema Musculoskeletal: No deformities Skin: Warm and dry  Neuro: Alert and oriented to person, place, time, and situation Psych: Normal mood and affect   Labs  High Sensitivity Troponin:   Recent Labs  Lab 02/14/21 1954 02/14/21 2216  TROPONINIHS 99* 82*      Cardiac EnzymesNo results for input(s): TROPONINI in the last  168 hours. No results for input(s): TROPIPOC in the last 168 hours.  Chemistry Recent Labs  Lab 02/14/21 1611 02/14/21 2102 02/17/21 2152 02/18/21 0247 02/19/21 0208  NA 135   < > 135 135 135  K 4.2   < > 3.0* 3.1* 3.2*  CL 101   < > 99 95* 97*  CO2 13*   < > '23 23 23  '$ GLUCOSE 89   < > 117* 103* 121*  BUN 180*   < > 92* 95* 106*  CREATININE 27.29*   < > 16.65* 17.11* 17.68*  CALCIUM 6.5*   < > 7.4* 7.4* 7.6*  PROT 6.9  --  6.0*  --   --   ALBUMIN 3.6  --  3.0*  --  2.9*  AST 36  --  53*  --   --   ALT 29  --  41  --   --   ALKPHOS 79  --  80  --   --   BILITOT 0.7  --  0.5  --   --   GFRNONAA 2*   < > 3* 3* 3*  ANIONGAP 21*   < > 13 17* 15   < > = values in this interval not displayed.     Hematology Recent Labs  Lab 02/16/21 0154 02/18/21 0247 02/18/21 2100 02/19/21 0208  WBC 3.6* 3.9*  --  4.0  RBC 2.63* 2.43*  --  2.74*  HGB 7.0* 6.4* 7.2* 7.4*  HCT 20.4* 19.6* 21.5* 22.2*  MCV  77.6* 80.7  --  81.0  MCH 26.6 26.3  --  27.0  MCHC 34.3 32.7  --  33.3  RDW 15.1 15.3  --  15.1  PLT 68* 48*  --  45*    BNPNo results for input(s): BNP, PROBNP in the last 168 hours.  DDimer No results for input(s): DDIMER in the last 168 hours.   Radiology  No results found.  Cardiac Studies  TTE 02/15/2021  1. Left ventricular ejection fraction, by estimation, is 40 to 45%. The  left ventricle has mildly decreased function. The left ventricle has no  regional wall motion abnormalities. There is severe concentric left  ventricular hypertrophy. Left ventricular   diastolic parameters are consistent with Grade II diastolic dysfunction  (pseudonormalization). Elevated left ventricular end-diastolic pressure.   2. Right ventricular systolic function is normal. The right ventricular  size is normal. Tricuspid regurgitation signal is inadequate for assessing  PA pressure.   3. Left atrial size was severely dilated.   4. Right atrial size was mildly dilated.   5. The mitral valve is grossly normal. Mild mitral valve regurgitation.   6. The aortic valve is tricuspid. There is mild thickening of the aortic  valve. Aortic valve regurgitation is trivial. No aortic stenosis is  present. Aortic valve mean gradient measures 6.0 mmHg.   7. Aortic dilatation noted. There is mild dilatation of the aortic root,  measuring 41 mm.   8. The inferior vena cava is normal in size with <50% respiratory  variability, suggesting right atrial pressure of 8 mmHg.   Patient Profile  Antonio Keller is a 60 y.o. male with hypertension, CKD stage V who was admitted on 02/15/2021 with nausea vomiting with progression of kidney disease requiring initiation of hemodialysis.  Cardiology was consulted for atrial fibrillation as well as systolic heart failure.  Assessment & Plan   #New onset atrial fibrillation -CHADSVASC=2 (HTN, CHF).  -Suspect his critical illness drove his atrial fibrillation.  He is converted  back to sinus rhythm but having paroxysms of A. fib.  Started on amiodarone with plan for 400 mg twice daily for 7 days and then 200 mg daily for 21 days.   -He does merit anticoagulation with a new diagnosis of systolic heart failure.  He is anemic but this was felt to be chronic.  He was started on heparin drip but developed bleeding around tunneled catheter site.  Hemoglobin down to 6.4 and platelets down to 45.  Would hold off on anticoagulation at this time.  Can revisit if improvement in anemia/thrombocytopenia  #New onset systolic heart failure, EF 40-45% -Severe concentric LVH.  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  Continue with dialysis for volume removal. -He is on Coreg 25 mg twice daily. -Not a candidate for ACE/ARB/Arni/MRA given ESRD. -He is on hydralazine 100 mg 2 times daily and Imdur 30 mg daily -Not a candidate for SGLT2 inhibitor currently.  #QTc prolongation -He has LVH with repolarization abnormality, QTc 523 on EKG 9/19.  Will monitor  #Elevated troponin -Suspect demand ischemia.  Do not suspect an acute coronary syndrome.  Suspect this was minimally elevated in the setting of severe metabolic acidosis as well as hypertensive heart disease. -Can pursue outpatient cardiac stress test  -Can discontinue aspirin given anemia/thrombocytopenia.  -Have added a statin.  #Anion gap metabolic acidosis #CKD stage V now hemodialysis -Corrected with hemodialysis.  Per nephrology.  For questions or updates, please contact [place] Please consult [email or web address] for contact info under    Donato Heinz, MD

## 2021-02-19 NOTE — TOC Progression Note (Signed)
Transition of Care [facility]) - Progression Note    Patient Details  Name: Antonio Keller MRN: [id number] Date of Birth: [birth year]  Transition of Care [facility]) CM/SW Contact  Tom-Johnson, Renea Ee, RN Phone Number: 02/19/2021, 3:48 PM  Clinical Narrative:    CM called to patient's room by wife.Requesting a medical letter to send to immigration to state his condition in the hospital. Patient is trying to bring his daughter from [place] to come visit. MD made aware via secure chat.      Barriers to Discharge: Continued Medical Work up  Expected Discharge Plan and Services         Living arrangements for the past 2 months: Apartment                                       Social Determinants of Health (SDOH) Interventions    Readmission Risk Interventions No flowsheet data found.

## 2021-02-20 DIAGNOSIS — D638 Anemia in other chronic diseases classified elsewhere: Secondary | ICD-10-CM | POA: Diagnosis not present

## 2021-02-20 DIAGNOSIS — D696 Thrombocytopenia, unspecified: Secondary | ICD-10-CM | POA: Diagnosis not present

## 2021-02-20 LAB — CBC
HCT: 21 % — ABNORMAL LOW (ref 39.0–52.0)
Hemoglobin: 7 g/dL — ABNORMAL LOW (ref 13.0–17.0)
MCH: 27 pg (ref 26.0–34.0)
MCHC: 33.3 g/dL (ref 30.0–36.0)
MCV: 81.1 fL (ref 80.0–100.0)
Platelets: 43 10*3/uL — ABNORMAL LOW (ref 150–400)
RBC: 2.59 MIL/uL — ABNORMAL LOW (ref 4.22–5.81)
RDW: 15.2 % (ref 11.5–15.5)
WBC: 4 10*3/uL (ref 4.0–10.5)
nRBC: 0 % (ref 0.0–0.2)

## 2021-02-20 LAB — COMPREHENSIVE METABOLIC PANEL
ALT: 42 U/L (ref 0–44)
AST: 43 U/L — ABNORMAL HIGH (ref 15–41)
Albumin: 2.8 g/dL — ABNORMAL LOW (ref 3.5–5.0)
Alkaline Phosphatase: 70 U/L (ref 38–126)
Anion gap: 11 (ref 5–15)
BUN: 72 mg/dL — ABNORMAL HIGH (ref 6–20)
CO2: 26 mmol/L (ref 22–32)
Calcium: 8 mg/dL — ABNORMAL LOW (ref 8.9–10.3)
Chloride: 97 mmol/L — ABNORMAL LOW (ref 98–111)
Creatinine, Ser: 13.41 mg/dL — ABNORMAL HIGH (ref 0.61–1.24)
GFR, Estimated: 4 mL/min — ABNORMAL LOW (ref >60)
Glucose, Bld: 129 mg/dL — ABNORMAL HIGH (ref 70–99)
Potassium: 3.6 mmol/L (ref 3.5–5.1)
Sodium: 134 mmol/L — ABNORMAL LOW (ref 135–145)
Total Bilirubin: 0.3 mg/dL (ref 0.3–1.2)
Total Protein: 5.9 g/dL — ABNORMAL LOW (ref 6.5–8.1)

## 2021-02-20 LAB — ANA W/REFLEX IF POSITIVE: Anti Nuclear Antibody (ANA): NEGATIVE

## 2021-02-20 LAB — RHEUMATOID FACTOR: Rheumatoid fact SerPl-aCnc: 20.2 IU/mL — ABNORMAL HIGH (ref <14.0)

## 2021-02-20 LAB — ERYTHROPOIETIN: Erythropoietin: 6.6 m[IU]/mL (ref 2.6–18.5)

## 2021-02-20 LAB — PREPARE RBC (CROSSMATCH)

## 2021-02-20 LAB — HEMOGLOBIN AND HEMATOCRIT, BLOOD
HCT: 23.4 % — ABNORMAL LOW (ref 39.0–52.0)
Hemoglobin: 7.6 g/dL — ABNORMAL LOW (ref 13.0–17.0)

## 2021-02-20 LAB — HAPTOGLOBIN: Haptoglobin: 107 mg/dL (ref 29–370)

## 2021-02-20 MED ORDER — SODIUM CHLORIDE 0.9% IV SOLUTION: Freq: Once | INTRAVENOUS | Status: AC

## 2021-02-20 NOTE — Progress Notes (Addendum)
PROGRESS NOTE    Antonio Keller  [id number] DOB: July 25, 1960 DOA: 02/14/2021 PCP: Jolinda Croak, MD   Brief Narrative:  Antonio Keller ,is a 60 y.o. male with past medical history of end-stage renal disease, not on dialysis, hypertension, who presented to the [facility] ED with a chief complaint of nausea, vomiting, and abdominal pain ongoing for 2 weeks, worse since 1 week.  Became increasingly fatigued so he called EMS on 02/14/2021.   ED course was notable for ABG of 7.22/30/114/12.5, Trop 99, MG 2.0, K 4.1, HGB 8.5, lipase 216. Lactate 0.7, AG 21, BUN 180 While in the ED he developed afib with RVR and was started on an amio gtt. he was admitted under ICU.  Neph and Cardiology were consulted by the ED.  Transferred under [place] on 02/17/2021.  Assessment & Plan:   Active Problems:   Essential hypertension   Nausea and vomiting   New onset atrial fibrillation (HCC)   ESRD (end stage renal disease) (HCC)  End-stage renal disease, newly started on hemodialysis/High anion gap metabolic acidosis: Nephrology following.  Peggs placed on 02/15/2021 by IR.  Received first session of dialysis on 02/16/2021.  VS consulted for permanent access however they prefer his platelets to get better before they do this.  Management per nephrology.  Hypokalemia: Defer to nephrology.  Acute hepatitis B infection: Hepatitis B surface antigen positive but patient completely asymptomatic and LFTs normal.  No indication to treat acutely.  Will need follow-up with ID as outpatient.  Paroxysmal new onset atrial fibrillation/Prolonged QT: CHADSVASC=2 (HTN, CHF).  Cardiology on board and managing.  Suspected this is critical illness driven.  Converted back to sinus rhythm.  Currently on amiodarone for total of 28 days.  Heparin is stopped due to some bleeding at the dialysis catheter. Cardiology recommends holding off on heparin for now due to persistent thrombocytopenia and may consider resuming once his platelets are over  50.   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  Per cardiology, this is likely secondary to hypertensive cardiomyopathy.  Per cardiology, he is Not a candidate for ACE/ARB/Arni/MRA given ESRD.  Currently he is on Coreg 25 mg p.o. twice daily, hydralazine 100 mg p.o. twice daily and Imdur 30 mg p.o. daily. Not a candidate for SGLT2 inhibitor currently.   Hypertension: Controlled.  Continue current medications as stated above.   Pancytopenia: Could very well be due to CKD but should seek further work-up for possible other causes.  Oncology consulted and work-up sent.  Defer to oncology.  Patient is status post 1 unit PRBC transfusion on 02/18/2021.  Hemoglobin improved posttransfusion but hemoglobin down to 7.0 again.  Will transfuse 1 more unit today.  DVT prophylaxis: enoxaparin (LOVENOX) injection 30 mg Start: 02/16/21 1100 SCDs Start: 02/14/21 2244   Code Status: Full Code  Family Communication:  None present at bedside.  Plan of care discussed with patient in length and he verbalized understanding and agreed with it.  Status is: Inpatient  Remains inpatient appropriate because:Inpatient level of care appropriate due to severity of illness  Dispo: The patient is from: Home              Anticipated d/c is to: Home              Patient currently is not medically stable to d/c.   Difficult to place patient No   Estimated body mass index is 24.73 kg/m as calculated  from the following:   Height as of 12/19/20: 6' (1.829 m).   Weight as of this encounter: 82.7 kg.     Nutritional Assessment: Body mass index is 24.73 kg/m.Marland Kitchen Seen by dietician.  I agree with the assessment and plan as outlined below: Nutrition Status:   Skin Assessment: I have examined the patient's skin and I agree with the wound assessment as performed by the wound care RN as outlined below:   Consultants:  Nephrology and  cardiology  Procedures:  [facility] placement 02/15/2021.  Antimicrobials:  Anti-infectives (From admission, onward)    None          Subjective: Patient seen and examined.  He is no complaints.  Objective: Vitals:   02/20/21 0600 02/20/21 0758 02/20/21 0824 02/20/21 1100  BP: 115/67  128/81   Pulse: 63  67   Resp: (!) 9  11   Temp:  98.2 F (36.8 C)  98.3 F (36.8 C)  TempSrc:  Oral  Oral  SpO2: 99%  98%   Weight:        Intake/Output Summary (Last 24 hours) at 02/20/2021 1141 Last data filed at 02/19/2021 1800 Gross per 24 hour  Intake 500 ml  Output 250 ml  Net 250 ml    Filed Weights   02/17/21 1300 02/17/21 1505 02/18/21 0500  Weight: 83.8 kg 82.7 kg 82.7 kg    Examination:  General exam: Appears calm and comfortable  Respiratory system: Clear to auscultation. Respiratory effort normal. Cardiovascular system: S1 & S2 heard, RRR. No JVD, murmurs, rubs, gallops or clicks. No pedal edema. Gastrointestinal system: Abdomen is nondistended, soft and nontender. No organomegaly or masses felt. Normal bowel sounds heard. Central nervous system: Alert and oriented. No focal neurological deficits. Extremities: Symmetric 5 x 5 power. Skin: No rashes, lesions or ulcers.  Psychiatry: Judgement and insight appear normal. Mood & affect appropriate.    Data Reviewed: I have personally reviewed following labs and imaging studies  CBC: Recent Labs  Lab 02/14/21 1611 02/14/21 2102 02/15/21 0618 02/16/21 0154 02/18/21 0247 02/18/21 2100 02/19/21 0208 02/19/21 1652 02/20/21 0153  WBC 3.2*  --  3.3* 3.6* 3.9*  --  4.0  --  4.0  NEUTROABS 1.6*  --   --   --   --   --   --   --   --   HGB 7.8*   < > 7.2* 7.0* 6.4* 7.2* 7.4*  --  7.0*  HCT 23.6*   < > 21.4* 20.4* 19.6* 21.5* 22.2*  --  21.0*  MCV 80.8  --  79.6* 77.6* 80.7  --  81.0  --  81.1  PLT 82*  --  72* 68* 48*  --  45* 46* 43*   < > = values in this interval not displayed.    Basic Metabolic Panel: Recent  Labs  Lab 02/14/21 1954 02/14/21 2102 02/15/21 [id number] 02/16/21 0154 02/17/21 0008 02/17/21 2152 02/18/21 0247 02/19/21 0208 02/20/21 0153  NA  --    < > 135 136 134* 135 135 135 134*  K  --    < > 3.5 3.0* 3.0* 3.0* 3.1* 3.2* 3.6  CL  --   --  99 96* 94* 99 95* 97* 97*  CO2  --   --  16* 19* '22 23 23 23 26  '$ GLUCOSE  --   --  129* 139* 102* 117* 103* 121* 129*  BUN  --   --  177* 177* 126* 92* 95* 106*  72*  CREATININE  --   --  27.50* 26.48* 20.26* 16.65* 17.11* 17.68* 13.41*  CALCIUM  --   --  6.5* 6.0* 6.6*  6.4* 7.4* 7.4* 7.6* 8.0*  MG 2.0  --  2.0 1.9 1.7 1.7  --   --   --   PHOS  --   --  10.4* 9.4* 6.5*  --   --  6.5*  --    < > = values in this interval not displayed.    GFR: Estimated Creatinine Clearance: 6.4 mL/min (A) (by C-G formula based on SCr of 13.41 mg/dL (H)). Liver Function Tests: Recent Labs  Lab 02/14/21 1611 02/17/21 2152 02/19/21 0208 02/20/21 0153  AST 36 53*  --  43*  ALT 29 41  --  42  ALKPHOS 79 80  --  70  BILITOT 0.7 0.5  --  0.3  PROT 6.9 6.0*  --  5.9*  ALBUMIN 3.6 3.0* 2.9* 2.8*    Recent Labs  Lab 02/14/21 1954 02/15/21 0618  LIPASE 216* 378*    No results for input(s): AMMONIA in the last 168 hours. Coagulation Profile: Recent Labs  Lab 02/14/21 1954 02/19/21 1652  INR 1.3* 1.3*    Cardiac Enzymes: No results for input(s): CKTOTAL, CKMB, CKMBINDEX, TROPONINI in the last 168 hours. BNP (last 3 results) No results for input(s): PROBNP in the last 8760 hours. HbA1C: No results for input(s): HGBA1C in the last 72 hours. CBG: Recent Labs  Lab 02/16/21 2019 02/16/21 2308 02/17/21 0455 02/17/21 1931 02/18/21 0045  GLUCAP 108* 129* 100* 141* 103*    Lipid Profile: No results for input(s): CHOL, HDL, LDLCALC, TRIG, CHOLHDL, LDLDIRECT in the last 72 hours.  Thyroid Function Tests: No results for input(s): TSH, T4TOTAL, FREET4, T3FREE, THYROIDAB in the last 72 hours. Anemia Panel: Recent Labs    02/19/21 1211  02/19/21 1652  VITAMINB12 285  --   FOLATE 7.8  --   FERRITIN 347*  --   TIBC 259  --   IRON 72  --   RETICCTPCT  --  0.5   Sepsis Labs: Recent Labs  Lab 02/14/21 1611  LATICACIDVEN 0.7     Recent Results (from the past 240 hour(s))  Resp Panel by RT-PCR (Flu A&B, Covid) Nasopharyngeal Swab     Status: None   Collection Time: 02/14/21  7:17 PM   Specimen: Nasopharyngeal Swab; Nasopharyngeal(NP) swabs in vial transport medium  Result Value Ref Range Status   SARS Coronavirus 2 by RT PCR NEGATIVE NEGATIVE Final    Comment: (NOTE) SARS-CoV-2 target nucleic acids are NOT DETECTED.  The SARS-CoV-2 RNA is generally detectable in upper respiratory specimens during the acute phase of infection. The lowest concentration of SARS-CoV-2 viral copies this assay can detect is 138 copies/mL. A negative result does not preclude SARS-Cov-2 infection and should not be used as the sole basis for treatment or other patient management decisions. A negative result may occur with  improper specimen collection/handling, submission of specimen other than nasopharyngeal swab, presence of viral mutation(s) within the areas targeted by this assay, and inadequate number of viral copies(<138 copies/mL). A negative result must be combined with clinical observations, patient history, and epidemiological information. The expected result is Negative.  Fact Sheet for Patients:  EntrepreneurPulse.com.au  Fact Sheet for Healthcare Providers:  IncredibleEmployment.be  This test is no t yet approved or cleared by the [place] FDA and  has been authorized for detection and/or diagnosis of SARS-CoV-2 by FDA under an  Emergency Use Authorization (EUA). This EUA will remain  in effect (meaning this test can be used) for the duration of the COVID-19 declaration under Section 564(b)(1) of the Act, 21 U.S.C.section 360bbb-3(b)(1), unless the authorization is terminated  or  revoked sooner.       Influenza A by PCR NEGATIVE NEGATIVE Final   Influenza B by PCR NEGATIVE NEGATIVE Final    Comment: (NOTE) The Xpert Xpress SARS-CoV-2/FLU/RSV plus assay is intended as an aid in the diagnosis of influenza from Nasopharyngeal swab specimens and should not be used as a sole basis for treatment. Nasal washings and aspirates are unacceptable for Xpert Xpress SARS-CoV-2/FLU/RSV testing.  Fact Sheet for Patients: EntrepreneurPulse.com.au  Fact Sheet for Healthcare Providers: IncredibleEmployment.be  This test is not yet approved or cleared by the [place] FDA and has been authorized for detection and/or diagnosis of SARS-CoV-2 by FDA under an Emergency Use Authorization (EUA). This EUA will remain in effect (meaning this test can be used) for the duration of the COVID-19 declaration under Section 564(b)(1) of the Act, 21 U.S.C. section 360bbb-3(b)(1), unless the authorization is terminated or revoked.  Performed at [facility], [place] [address], [place], [place] [address]   MRSA Next Gen by PCR, Nasal     Status: None   Collection Time: 02/15/21 12:38 AM   Specimen: Nasal Mucosa; Nasal Swab  Result Value Ref Range Status   MRSA by PCR Next Gen NOT DETECTED NOT DETECTED Final    Comment: (NOTE) The GeneXpert MRSA Assay (FDA approved for NASAL specimens only), is one component of a comprehensive MRSA colonization surveillance program. It is not intended to diagnose MRSA infection nor to guide or monitor treatment for MRSA infections. Test performance is not FDA approved in patients less than 54 years old. Performed at [facility], [place] [address], [place],  [address]        Radiology Studies: No results found.  Scheduled Meds:  sodium chloride   Intravenous Once   sodium chloride   Intravenous Once   amiodarone  400 mg Oral BID   Followed by   Derrill Memo ON 02/23/2021] amiodarone  200 mg Oral  Daily   atorvastatin  40 mg Oral Daily   calcitRIOL  0.25 mcg Oral Daily   calcium acetate  1,334 mg Oral TID WC   calcium carbonate  1 tablet Oral TID WC   carvedilol  25 mg Oral BID WC   Chlorhexidine Gluconate Cloth  6 each Topical Q0600   enoxaparin (LOVENOX) injection  30 mg Subcutaneous Q24H   hydrALAZINE  100 mg Oral Q12H   isosorbide mononitrate  30 mg Oral Daily   sodium chloride flush  10-40 mL Intracatheter Q12H   Continuous Infusions:     LOS: 6 days   Time spent: 26 minutes   Darliss Cheney, MD [facility]  02/20/2021, 11:41 AM  Please page via Shea Evans and do not message via secure chat for anything urgent. Secure chat can be used for anything non urgent.  How to contact the [facility] Attending or Consulting provider [place] or covering provider during after hours [place], for this patient?  Check the care team in [facility] and look for a) attending/consulting TRH provider listed and b) the [facility] team listed. Page or secure chat [phone]. Log into [email or web address] and use [place]'s universal password to access. If you do not have the password, please contact the hospital operator. Locate the [facility] provider you are looking for  under [facility] and page to a number that you can be directly reached. If you still have difficulty reaching the provider, please page the [facility] (Director on Call) for the Hospitalists listed on amion for assistance.

## 2021-02-20 NOTE — Progress Notes (Signed)
[facility] ROUNDING NOTE   Subjective:   Interval History: 60 year old male presented to the emergency room with nausea vomiting abdominal pain.  Multiple courses of emesis.  In the emergency room developed A. fib with rapid ventricular rate.  He is a new start to dialysis.  He has positive hepatitis B antigen.  He has a history of congestive heart failure systolic dysfunction ejection fraction 40-45%.  Blood pressure 128/81 pulse 87 temperature 98.2 O2 sats 100% room air.  Dialysis 02/19/2021 this is his third dialysis treatment..  Does have some urine output.  His next dialysis be 02/21/2021  Sodium 134 potassium 3.6 chloride 97 CO2 26 BUN of 72 creatinine 13 glucose 129 calcium 8 albumin 2.8.       Objective:  Vital signs in last 24 hours:  Temp:  [98 F (36.7 C)-98.9 F (37.2 C)] 98.2 F (36.8 C) (09/22 0758) Pulse Rate:  [61-114] 67 (09/22 0824) Resp:  [7-22] 11 (09/22 0824) BP: (103-155)/(60-97) 128/81 (09/22 0824) SpO2:  [96 %-100 %] 98 % (09/22 0824)  Weight change:  Filed Weights   02/17/21 1300 02/17/21 1505 02/18/21 0500  Weight: 83.8 kg 82.7 kg 82.7 kg    Intake/Output: I/O last 3 completed shifts: In: 68 [P.O.:740; I.V.:20] Out: [id number] [Urine:375; Other:1000]   Intake/Output this shift:  No intake/output data recorded.  Gen:nad CVS:rrr Resp:normal wob [id number] Ext:no edema Neuro: no focal deficits HD access: [facility]   Basic Metabolic Panel: Recent Labs  Lab 02/14/21 1954 02/14/21 2102 02/15/21 0618 02/16/21 0154 02/17/21 0008 02/17/21 2152 02/18/21 0247 02/19/21 0208 02/20/21 0153  NA  --    < > 135 136 134* 135 135 135 134*  K  --    < > 3.5 3.0* 3.0* 3.0* 3.1* 3.2* 3.6  CL  --   --  99 96* 94* 99 95* 97* 97*  CO2  --   --  16* 19* '22 23 23 23 26  '$ GLUCOSE  --   --  129* 139* 102* 117* 103* 121* 129*  BUN  --   --  177* 177* 126* 92* 95* 106* 72*  CREATININE  --   --  27.50* 26.48* 20.26* 16.65* 17.11* 17.68* 13.41*   CALCIUM  --   --  6.5* 6.0* 6.6*  6.4* 7.4* 7.4* 7.6* 8.0*  MG 2.0  --  2.0 1.9 1.7 1.7  --   --   --   PHOS  --   --  10.4* 9.4* 6.5*  --   --  6.5*  --    < > = values in this interval not displayed.     Liver Function Tests: Recent Labs  Lab 02/14/21 1611 02/17/21 2152 02/19/21 0208 02/20/21 0153  AST 36 53*  --  43*  ALT 29 41  --  42  ALKPHOS 79 80  --  70  BILITOT 0.7 0.5  --  0.3  PROT 6.9 6.0*  --  5.9*  ALBUMIN 3.6 3.0* 2.9* 2.8*    Recent Labs  Lab 02/14/21 1954 02/15/21 0618  LIPASE 216* 378*    No results for input(s): AMMONIA in the last 168 hours.  CBC: Recent Labs  Lab 02/14/21 1611 02/14/21 2102 02/15/21 0618 02/16/21 0154 02/18/21 0247 02/18/21 2100 02/19/21 0208 02/19/21 1652 02/20/21 0153  WBC 3.2*  --  3.3* 3.6* 3.9*  --  4.0  --  4.0  NEUTROABS 1.6*  --   --   --   --   --   --   --   --  HGB 7.8*   < > 7.2* 7.0* 6.4* 7.2* 7.4*  --  7.0*  HCT 23.6*   < > 21.4* 20.4* 19.6* 21.5* 22.2*  --  21.0*  MCV 80.8  --  79.6* 77.6* 80.7  --  81.0  --  81.1  PLT 82*  --  72* 68* 48*  --  45* 46* 43*   < > = values in this interval not displayed.     Cardiac Enzymes: No results for input(s): CKTOTAL, CKMB, CKMBINDEX, TROPONINI in the last 168 hours.  BNP: Invalid input(s): POCBNP  CBG: Recent Labs  Lab 02/16/21 2019 02/16/21 2308 02/17/21 0455 02/17/21 1931 02/18/21 0045  GLUCAP 108* 129* 100* 141* 103*     Microbiology: Results for orders placed or performed during the hospital encounter of 02/14/21  Resp Panel by RT-PCR (Flu A&B, Covid) Nasopharyngeal Swab     Status: None   Collection Time: 02/14/21  7:17 PM   Specimen: Nasopharyngeal Swab; Nasopharyngeal(NP) swabs in vial transport medium  Result Value Ref Range Status   SARS Coronavirus 2 by RT PCR NEGATIVE NEGATIVE Final    Comment: (NOTE) SARS-CoV-2 target nucleic acids are NOT DETECTED.  The SARS-CoV-2 RNA is generally detectable in upper respiratory specimens  during the acute phase of infection. The lowest concentration of SARS-CoV-2 viral copies this assay can detect is 138 copies/mL. A negative result does not preclude SARS-Cov-2 infection and should not be used as the sole basis for treatment or other patient management decisions. A negative result may occur with  improper specimen collection/handling, submission of specimen other than nasopharyngeal swab, presence of viral mutation(s) within the areas targeted by this assay, and inadequate number of viral copies(<138 copies/mL). A negative result must be combined with clinical observations, patient history, and epidemiological information. The expected result is Negative.  Fact Sheet for Patients:  EntrepreneurPulse.com.au  Fact Sheet for Healthcare Providers:  IncredibleEmployment.be  This test is no t yet approved or cleared by the [place] FDA and  has been authorized for detection and/or diagnosis of SARS-CoV-2 by FDA under an Emergency Use Authorization (EUA). This EUA will remain  in effect (meaning this test can be used) for the duration of the COVID-19 declaration under Section 564(b)(1) of the Act, 21 U.S.C.section 360bbb-3(b)(1), unless the authorization is terminated  or revoked sooner.       Influenza A by PCR NEGATIVE NEGATIVE Final   Influenza B by PCR NEGATIVE NEGATIVE Final    Comment: (NOTE) The Xpert Xpress SARS-CoV-2/FLU/RSV plus assay is intended as an aid in the diagnosis of influenza from Nasopharyngeal swab specimens and should not be used as a sole basis for treatment. Nasal washings and aspirates are unacceptable for Xpert Xpress SARS-CoV-2/FLU/RSV testing.  Fact Sheet for Patients: EntrepreneurPulse.com.au  Fact Sheet for Healthcare Providers: IncredibleEmployment.be  This test is not yet approved or cleared by the [place] FDA and has been authorized for detection  and/or diagnosis of SARS-CoV-2 by FDA under an Emergency Use Authorization (EUA). This EUA will remain in effect (meaning this test can be used) for the duration of the COVID-19 declaration under Section 564(b)(1) of the Act, 21 U.S.C. section 360bbb-3(b)(1), unless the authorization is terminated or revoked.  Performed at [facility], [place] [address], [place], [place] [address]   MRSA Next Gen by PCR, Nasal     Status: None   Collection Time: 02/15/21 12:38 AM   Specimen: Nasal Mucosa; Nasal Swab  Result Value Ref Range Status  MRSA by PCR Next Gen NOT DETECTED NOT DETECTED Final    Comment: (NOTE) The GeneXpert MRSA Assay (FDA approved for NASAL specimens only), is one component of a comprehensive MRSA colonization surveillance program. It is not intended to diagnose MRSA infection nor to guide or monitor treatment for MRSA infections. Test performance is not FDA approved in patients less than 55 years old. Performed at [facility], [place] [address] [place], [place] [address]     Coagulation Studies: Recent Labs    02/19/21 1652  LABPROT 15.7*  INR 1.3*     Urinalysis: No results for input(s): COLORURINE, LABSPEC, PHURINE, GLUCOSEU, HGBUR, BILIRUBINUR, KETONESUR, PROTEINUR, UROBILINOGEN, NITRITE, LEUKOCYTESUR in the last 72 hours.  Invalid input(s): APPERANCEUR    Imaging: No results found.   Medications:      sodium chloride   Intravenous Once   amiodarone  400 mg Oral BID   Followed by   Derrill Memo ON 02/23/2021] amiodarone  200 mg Oral Daily   atorvastatin  40 mg Oral Daily   calcitRIOL  0.25 mcg Oral Daily   calcium acetate  1,334 mg Oral TID WC   calcium carbonate  1 tablet Oral TID WC   carvedilol  25 mg Oral BID WC   Chlorhexidine Gluconate Cloth  6 each Topical Q0600   enoxaparin (LOVENOX) injection  30 mg Subcutaneous Q24H   hydrALAZINE  100 mg Oral Q12H   isosorbide mononitrate  30 mg Oral Daily   sodium chloride flush  10-40 mL  Intracatheter Q12H   docusate sodium, heparin sodium (porcine), lidocaine, polyethylene glycol, sodium chloride flush  Assessment/ Plan:  CKD5, 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 -started HD for uremic symptoms.  02/16/2021, 02/17/2021.    02/19/2021.  Next dialysis will be 02/21/2021 -Consulted VVS for permanent access, appreciate assistance.  Patient has a center at [facility] clinic Monday Wednesday Friday afternoon.  Appreciate assistance of [employer].  Patient has been evaluated by vein and vascular surgery.  They are hesitant to place AV fistula at this point as he remains pancytopenic.  He has a right tunneled dialysis catheter      Hypocalcemia resolved.  Patient with hypophosphatemia continue binders   Afib w/ RVR -cardio on board, currently on amio   Hypertension: -home meds on hold, BP acceptable   Metabolic acidosis, metabolic acidemia -Improved with dialysis   Anemia due to chronic kidney disease: -Transfuse for Hgb<7 g/dL.    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  We will start vitamin D.  Continues on calcium supplementation.  Follow renal panel daily  Pancytopenia.  Evaluated by Dr. Earlie Server appreciate assistance.  This is limiting his ability to undergo AV fistula placement   LOS: [place] '@TODAY''@9'$ :45 AM

## 2021-02-20 NOTE — Progress Notes (Addendum)
Spoke to pt's wife via phone to advise her of pt's out-pt HD schedule. Checking to see if pt needs to arrive early for first treatment to complete any paperwork. Will provide info to pt's wife if that is necessary.  [facility] MWF Arrive at 12:00/chair time of 12:20  Melven Sartorius Renal Navigator [phone]  Addendum: Pt will need to arrive at 11:30 for his first treatment. Will advise wife of this information once d/c date is known.

## 2021-02-20 NOTE — Progress Notes (Signed)
HEMATOLOGY-ONCOLOGY PROGRESS NOTE  SUBJECTIVE: Antonio Keller reports that he feels well today.  He denies bleeding.  He offers no other complaints today.  REVIEW OF SYSTEMS:   A comprehensive 10 point review of systems is negative.  I have reviewed the past medical history, past surgical history, social history and family history with the patient and they are unchanged from previous note.   PHYSICAL EXAMINATION:  Vitals:   02/20/21 0758 02/20/21 0824  BP:  128/81  Pulse:  67  Resp:  11  Temp: 98.2 F (36.8 C)   SpO2:  98%   Filed Weights   02/17/21 1300 02/17/21 1505 02/18/21 0500  Weight: 83.8 kg 82.7 kg 82.7 kg    Intake/Output from previous day: 09/21 0701 - 09/22 0700 In: 500 [P.O.:500] Out: 1375 [Urine:375]  GENERAL:alert, no distress and comfortable SKIN: skin color, texture, turgor are normal, no rashes or significant lesions LUNGS: clear to auscultation and percussion with normal breathing effort HEART: regular rate & rhythm and no murmurs and no lower extremity edema ABDOMEN:abdomen soft, non-tender and normal bowel sounds NEURO: alert & oriented x 3 with fluent speech, no focal motor/sensory deficits  LABORATORY DATA:  I have reviewed the data as listed CMP Latest Ref Rng & Units 02/20/2021 02/19/2021 02/18/2021  Glucose 70 - 99 mg/dL 129(H) 121(H) 103(H)  BUN 6 - 20 mg/dL 72(H) 106(H) 95(H)  Creatinine 0.61 - 1.24 mg/dL 13.41(H) 17.68(H) 17.11(H)  Sodium 135 - 145 mmol/L 134(L) 135 135  Potassium 3.5 - 5.1 mmol/L 3.6 3.2(L) 3.1(L)  Chloride 98 - 111 mmol/L 97(L) 97(L) 95(L)  CO2 22 - 32 mmol/L 26 23 23   Calcium 8.9 - 10.3 mg/dL 8.0(L) 7.6(L) 7.4(L)  Total Protein 6.5 - 8.1 g/dL 5.9(L) - -  Total Bilirubin 0.3 - 1.2 mg/dL 0.3 - -  Alkaline Phos 38 - 126 U/L 70 - -  AST 15 - 41 U/L 43(H) - -  ALT 0 - 44 U/L 42 - -    Lab Results  Component Value Date   WBC 4.0 02/20/2021   HGB 7.0 (L) 02/20/2021   HCT 21.0 (L) 02/20/2021   MCV 81.1 02/20/2021   PLT 43  (L) 02/20/2021   NEUTROABS 1.6 (L) 02/14/2021    DG Chest 2 View  Result Date: 02/14/2021 CLINICAL DATA:  Shortness of breath EXAM: CHEST - 2 VIEW COMPARISON:  Chest x-ray dated November 18, 2020 FINDINGS: Inferior cardiophrenic angles are excluded from the field-of-view on AP image. Unchanged cardiomegaly and tortuosity of the thoracic aorta. Lungs are clear. No evidence of pleural effusion or pneumothorax. IMPRESSION: No active cardiopulmonary disease. Electronically Signed   By: Yetta Glassman M.D.   On: 02/14/2021 17:03   IR Fluoro Guide CV Line Right  Result Date: 02/15/2021 INDICATION: ESRD requiring HD. EXAM: TUNNELED CENTRAL VENOUS HEMODIALYSIS CATHETER PLACEMENT WITH ULTRASOUND AND FLUOROSCOPIC GUIDANCE MEDICATIONS: None. ANESTHESIA/SEDATION: Moderate (conscious) sedation was employed during this procedure. A total of Versed 1 mg and Fentanyl 50 mcg was administered intravenously. Moderate Sedation Time: 22 minutes. The patient's level of consciousness and vital signs were monitored continuously by radiology nursing throughout the procedure under my direct supervision. FLUOROSCOPY TIME:  0 minutes 18 seconds (1 mGy). COMPLICATIONS: None immediate. PROCEDURE: Informed written consent was obtained from the patient after a discussion of the risks, benefits, and alternatives to treatment. Questions regarding the procedure were encouraged and answered. The right neck and chest were prepped with chlorhexidine in a sterile fashion, and a sterile drape was applied covering  the operative field. Maximum barrier sterile technique with sterile gowns and gloves were used for the procedure. A timeout was performed prior to the initiation of the procedure. After creating a small venotomy incision, a micropuncture kit was utilized to access the internal jugular vein. Real-time ultrasound guidance was utilized for vascular access including the acquisition of a permanent ultrasound image documenting patency of the  accessed vessel. The microwire was utilized to measure appropriate catheter length. A stiff Glidewire was advanced to the level of the IVC and the micropuncture sheath was exchanged for a peel-away sheath. A tunneled hemodialysis catheter measuring 23 cm from tip to cuff was tunneled in a retrograde fashion from the anterior chest wall to the venotomy incision. The catheter was then placed through the peel-away sheath with tips ultimately positioned within the superior aspect of the right atrium. Final catheter positioning was confirmed and documented with a spot radiographic image. The catheter aspirates and flushes normally. The catheter was flushed with appropriate volume heparin dwells. The catheter exit site was secured with a 2-0 nylon retention suture. The venotomy incision was closed with Dermabond. Dressings were applied. The patient tolerated the procedure well without immediate post procedural complication. IMPRESSION: Successful placement of 23 cm tip to cuff tunneled hemodialysis catheter via the right internal jugular vein with tips terminating within the superior aspect of the right atrium. The catheter is ready for immediate use. Michaelle Birks, MD [facility] Radiology Electronically Signed   By: Michaelle Birks M.D.   On: 02/15/2021 13:46   IR US Guide Vasc Access Right  Result Date: 02/15/2021 INDICATION: ESRD requiring HD. EXAM: TUNNELED CENTRAL VENOUS HEMODIALYSIS CATHETER PLACEMENT WITH ULTRASOUND AND FLUOROSCOPIC GUIDANCE MEDICATIONS: None. ANESTHESIA/SEDATION: Moderate (conscious) sedation was employed during this procedure. A total of Versed 1 mg and Fentanyl 50 mcg was administered intravenously. Moderate Sedation Time: 22 minutes. The patient's level of consciousness and vital signs were monitored continuously by radiology nursing throughout the procedure under my direct supervision. FLUOROSCOPY TIME:  0 minutes 18 seconds (1 mGy).  COMPLICATIONS: None immediate. PROCEDURE: Informed written consent was obtained from the patient after a discussion of the risks, benefits, and alternatives to treatment. Questions regarding the procedure were encouraged and answered. The right neck and chest were prepped with chlorhexidine in a sterile fashion, and a sterile drape was applied covering the operative field. Maximum barrier sterile technique with sterile gowns and gloves were used for the procedure. A timeout was performed prior to the initiation of the procedure. After creating a small venotomy incision, a micropuncture kit was utilized to access the internal jugular vein. Real-time ultrasound guidance was utilized for vascular access including the acquisition of a permanent ultrasound image documenting patency of the accessed vessel. The microwire was utilized to measure appropriate catheter length. A stiff Glidewire was advanced to the level of the IVC and the micropuncture sheath was exchanged for a peel-away sheath. A tunneled hemodialysis catheter measuring 23 cm from tip to cuff was tunneled in a retrograde fashion from the anterior chest wall to the venotomy incision. The catheter was then placed through the peel-away sheath with tips ultimately positioned within the superior aspect of the right atrium. Final catheter positioning was confirmed and documented with a spot radiographic image. The catheter aspirates and flushes normally. The catheter was flushed with appropriate volume heparin dwells. The catheter exit site was secured with a 2-0 nylon retention suture. The venotomy incision was closed with Dermabond. Dressings were applied. The patient tolerated  the procedure well without immediate post procedural complication. IMPRESSION: Successful placement of 23 cm tip to cuff tunneled hemodialysis catheter via the right internal jugular vein with tips terminating within the superior aspect of the right atrium. The catheter is ready for  immediate use. Michaelle Birks, MD [facility] Radiology Electronically Signed   By: Michaelle Birks M.D.   On: 02/15/2021 13:46   DG Chest Port 1 View  Result Date: 02/14/2021 CLINICAL DATA:  Chest pain EXAM: PORTABLE CHEST 1 VIEW COMPARISON:  02/14/2021 FINDINGS: Mild cardiomegaly. No focal opacity or pleural effusion. Aortic atherosclerosis. No pneumothorax IMPRESSION: No active disease.  Mild cardiomegaly Electronically Signed   By: Donavan Foil M.D.   On: 02/14/2021 23:19   ECHOCARDIOGRAM COMPLETE  Result Date: 02/15/2021    ECHOCARDIOGRAM REPORT   Patient Name:   Antonio Keller Date of Exam: 02/15/2021 Medical Rec #:  [id number]     Height:       72.0 in Accession #:    [id number]    Weight:       176.8 lb Date of Birth:  May 25, 1961     BSA:          2.022 m Patient Age:    59 years      BP:           141/99 mmHg Patient Gender: M             HR:           57 bpm. Exam Location:  Inpatient Procedure: 2D Echo, 3D Echo, Cardiac Doppler and Color Doppler Indications:    Atrial fibrillation  History:        Patient has no prior history of Echocardiogram examinations.                 Risk Factors:Hypertension. ESRD.  Sonographer:    Clayton Lefort RDCS (AE) Referring Phys: [id number] [place]  1. Left ventricular ejection fraction, by estimation, is 40 to 45%. The left ventricle has mildly decreased function. The left ventricle has no regional wall motion abnormalities. There is severe concentric left ventricular hypertrophy. Left ventricular  diastolic parameters are consistent with Grade II diastolic dysfunction (pseudonormalization). Elevated left ventricular end-diastolic pressure.  2. Right ventricular systolic function is normal. The right ventricular size is normal. Tricuspid regurgitation signal is inadequate for assessing PA pressure.  3. Left atrial size was severely dilated.  4. Right atrial size was mildly dilated.  5. The mitral valve is  grossly normal. Mild mitral valve regurgitation.  6. The aortic valve is tricuspid. There is mild thickening of the aortic valve. Aortic valve regurgitation is trivial. No aortic stenosis is present. Aortic valve mean gradient measures 6.0 mmHg.  7. Aortic dilatation noted. There is mild dilatation of the aortic root, measuring 41 mm.  8. The inferior vena cava is normal in size with <50% respiratory variability, suggesting right atrial pressure of 8 mmHg. Comparison(s): No prior Echocardiogram. FINDINGS  Left Ventricle: Left ventricular ejection fraction, by estimation, is 40 to 45%. The left ventricle has mildly decreased function. The left ventricle has no regional wall motion abnormalities. The left ventricular internal cavity size was normal in size. There is severe concentric left ventricular hypertrophy. Left ventricular diastolic parameters are consistent with Grade II diastolic dysfunction (pseudonormalization). Elevated left ventricular end-diastolic pressure. Right Ventricle: The right ventricular size is normal. No increase in right ventricular wall thickness. Right ventricular systolic function is normal. Tricuspid regurgitation signal is  inadequate for assessing PA pressure. Left Atrium: Left atrial size was severely dilated. Right Atrium: Right atrial size was mildly dilated. Pericardium: There is no evidence of pericardial effusion. Mitral Valve: The mitral valve is grossly normal. Mild mitral valve regurgitation. MV peak gradient, 6.6 mmHg. The mean mitral valve gradient is 2.0 mmHg. Tricuspid Valve: The tricuspid valve is grossly normal. Tricuspid valve regurgitation is trivial. Aortic Valve: The aortic valve is tricuspid. There is mild thickening of the aortic valve. There is mild aortic valve annular calcification. Aortic valve regurgitation is trivial. No aortic stenosis is present. Aortic valve mean gradient measures 6.0 mmHg. Aortic valve peak gradient measures 9.0 mmHg. Aortic valve area, by  VTI measures 2.31 cm. Pulmonic Valve: The pulmonic valve was grossly normal. Pulmonic valve regurgitation is trivial. Aorta: Aortic dilatation noted. There is mild dilatation of the aortic root, measuring 41 mm. Venous: The inferior vena cava is normal in size with less than 50% respiratory variability, suggesting right atrial pressure of 8 mmHg. IAS/Shunts: No atrial level shunt detected by color flow Doppler.  LEFT VENTRICLE PLAX 2D LVIDd:         5.60 cm      Diastology LVIDs:         4.30 cm      LV e' medial:    4.95 cm/s LV PW:         2.00 cm      LV E/e' medial:  23.6 LV IVS:        2.00 cm      LV e' lateral:   10.10 cm/s LVOT diam:     2.30 cm      LV E/e' lateral: 11.6 LV SV:         81 LV SV Index:   40 LVOT Area:     4.15 cm                              3D Volume EF: LV Volumes (MOD)            3D EF:        45 % LV vol d, MOD A2C: 190.0 ml LV EDV:       232 ml LV vol d, MOD A4C: 166.0 ml LV ESV:       127 ml LV vol s, MOD A2C: 101.0 ml LV SV:        105 ml LV vol s, MOD A4C: 93.5 ml LV SV MOD A2C:     89.0 ml LV SV MOD A4C:     166.0 ml LV SV MOD BP:      88.2 ml RIGHT VENTRICLE             IVC RV Basal diam:  4.00 cm     IVC diam: 2.00 cm RV Mid diam:    2.20 cm RV S prime:     11.90 cm/s TAPSE (M-mode): 3.0 cm LEFT ATRIUM              Index       RIGHT ATRIUM           Index LA diam:        4.30 cm  2.13 cm/m  RA Area:     22.30 cm LA Vol (A2C):   116.0 ml 57.37 ml/m RA Volume:   70.00 ml  34.62 ml/m LA Vol (A4C):   115.0 ml 56.88 ml/m LA Biplane Vol:  120.0 ml 59.35 ml/m  AORTIC VALVE AV Area (Vmax):    2.39 cm AV Area (Vmean):   2.21 cm AV Area (VTI):     2.31 cm AV Vmax:           150.00 cm/s AV Vmean:          115.000 cm/s AV VTI:            0.350 m AV Peak Grad:      9.0 mmHg AV Mean Grad:      6.0 mmHg LVOT Vmax:         86.20 cm/s LVOT Vmean:        61.100 cm/s LVOT VTI:          0.195 m LVOT/AV VTI ratio: 0.56  AORTA Ao Root diam: 4.10 cm Ao Asc diam:  3.70 cm MITRAL VALVE MV Area  (PHT): 2.38 cm     SHUNTS MV Area VTI:   2.19 cm     Systemic VTI:  0.20 m MV Peak grad:  6.6 mmHg     Systemic Diam: 2.30 cm MV Mean grad:  2.0 mmHg MV Vmax:       1.28 m/s MV Vmean:      64.6 cm/s MV Decel Time: 319 msec MR Peak grad: 104.9 mmHg MR Mean grad: 68.0 mmHg MR Vmax:      512.00 cm/s MR Vmean:     387.0 cm/s MV E velocity: 117.00 cm/s MV A velocity: 58.70 cm/s MV E/A ratio:  1.99 Rozann Lesches MD Electronically signed by Rozann Lesches MD Signature Date/Time: 02/15/2021/3:01:03 PM    Final    VAS [place] UPPER EXT VEIN MAPPING (PRE-OP AVF)  Result Date: 02/16/2021 Robinson MAPPING Patient Name:  Antonio Keller  Date of Exam:   02/16/2021 Medical Rec #: [id number]      Accession #:    [id number] Date of Birth: [birth year]      Patient Gender: M Patient Age:   23 years Exam Location:  [facility] Procedure:      VAS [place] UPPER EXT VEIN MAPPING (PRE-OP AVF) Referring Phys: JOSHUA ROBINS --------------------------------------------------------------------------------  Indications: Pre-access. Comparison Study: no prior Performing Technologist: Archie Patten RVS  Examination Guidelines: A complete evaluation includes B-mode imaging, spectral Doppler, color Doppler, and power Doppler as needed of all accessible portions of each vessel. Bilateral testing is considered an integral part of a complete examination. Limited examinations for reoccurring indications may be performed as noted. +-----------------+-------------+----------+---------+ Right Cephalic   Diameter (cm)Depth (cm)Findings  +-----------------+-------------+----------+---------+ Shoulder             0.13        0.24             +-----------------+-------------+----------+---------+ Prox upper arm       0.21        0.23             +-----------------+-------------+----------+---------+ Mid upper arm        0.22        0.35   branching +-----------------+-------------+----------+---------+ Dist upper arm        0.29        0.48             +-----------------+-------------+----------+---------+ Antecubital fossa    0.28        0.24             +-----------------+-------------+----------+---------+ Prox forearm         0.22  0.19             +-----------------+-------------+----------+---------+ Mid forearm          0.18        0.19             +-----------------+-------------+----------+---------+ Dist forearm         0.13        0.35             +-----------------+-------------+----------+---------+ Wrist                0.17        0.31             +-----------------+-------------+----------+---------+ +-----------------+-------------+----------+--------+ Right Basilic    Diameter (cm)Depth (cm)Findings +-----------------+-------------+----------+--------+ Prox upper arm       0.42        0.74            +-----------------+-------------+----------+--------+ Mid upper arm        0.32        0.72            +-----------------+-------------+----------+--------+ Dist upper arm       0.32        0.62            +-----------------+-------------+----------+--------+ Antecubital fossa    0.30        0.20            +-----------------+-------------+----------+--------+ Prox forearm         0.26        0.27            +-----------------+-------------+----------+--------+ Mid forearm          0.13        0.27            +-----------------+-------------+----------+--------+ Distal forearm       0.13        0.19            +-----------------+-------------+----------+--------+ +-----------------+-------------+----------+--------+ Left Cephalic    Diameter (cm)Depth (cm)Findings +-----------------+-------------+----------+--------+ Shoulder             0.31        0.27            +-----------------+-------------+----------+--------+ Prox upper arm       0.31        0.26            +-----------------+-------------+----------+--------+ Mid upper arm         0.24        0.26            +-----------------+-------------+----------+--------+ Dist upper arm       0.30        0.23            +-----------------+-------------+----------+--------+ Antecubital fossa    0.33        0.35            +-----------------+-------------+----------+--------+ Prox forearm         0.25        0.32            +-----------------+-------------+----------+--------+ Mid forearm          0.15        0.27            +-----------------+-------------+----------+--------+ Dist forearm         0.20        0.43            +-----------------+-------------+----------+--------+ Wrist  0.19        0.35            +-----------------+-------------+----------+--------+ +-----------------+-------------+----------+--------+ Left Basilic     Diameter (cm)Depth (cm)Findings +-----------------+-------------+----------+--------+ Prox upper arm       0.36        0.55            +-----------------+-------------+----------+--------+ Mid upper arm        0.36        0.52            +-----------------+-------------+----------+--------+ Dist upper arm       0.35        0.46            +-----------------+-------------+----------+--------+ Antecubital fossa    0.21        0.25            +-----------------+-------------+----------+--------+ Prox forearm         0.15        0.33            +-----------------+-------------+----------+--------+ Mid forearm          0.15        0.20            +-----------------+-------------+----------+--------+ Distal forearm       0.14        0.15            +-----------------+-------------+----------+--------+ *See table(s) above for measurements and observations.  Diagnosing physician: Orlie Pollen Electronically signed by Orlie Pollen on 02/16/2021 at 2:56:24 PM.    Final    US Abdomen Limited RUQ (LIVER/GB)  Result Date: 02/14/2021 CLINICAL DATA:  Epigastric pain EXAM: ULTRASOUND ABDOMEN  LIMITED RIGHT UPPER QUADRANT COMPARISON:  Ultrasound 12/18/2020 FINDINGS: Gallbladder: No shadowing stones. Possible mild focal wall thickening up to 3.5 mm. Negative sonographic Murphy. Trace pericholecystic fluid Common bile duct: Diameter: 4.5 mm Liver: No focal lesion identified. Within normal limits in parenchymal echogenicity. Portal vein is patent on color Doppler imaging with normal direction of blood flow towards the liver. Other: Echogenic right kidney consistent with medical renal disease. IMPRESSION: 1. Negative for gallstones. There may be mild focal wall thickening but there is a negative sonographic Murphy. Trace fluid adjacent to gallbladder. Findings not strongly suggestive of acute gallbladder disease; if continued concern, nuclear medicine hepatobiliary imaging may be obtained. 2. Echogenic right kidney consistent with medical renal disease Electronically Signed   By: Donavan Foil M.D.   On: 02/14/2021 23:57    ASSESSMENT AND PLAN: This is a very pleasant 60 year old African male with mainly anemia and thrombocytopenia.  His leukocyte count are within the low normal range.  His anemia and partially thrombocytopenia can be definitely contributed to his end-stage renal disease as well as the other comorbidities including hepatitis B.  Lab work-up completed to date has been reviewed and discussed with Dr. Julien Nordmann.  He has no evidence of iron deficiency.  Folate level adequate.   There is no evidence of DIC or hemolysis.  The multiple myeloma panel, rheumatoid factor, ANA, erythropoietin level, and haptoglobin are still pending.  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  For now, will hold off on obtaining a bone marrow biopsy while awaiting pending labs.  His anemia and thrombocytopenia may likely be due to his end-stage renal disease and other comorbid conditions including his hepatitis B.  Would  recommend additional work-up of his hepatitis B  and evaluation for possible cirrhosis/splenomegaly.  No future appointments.    LOS: 6 days   Mikey Bussing, DNP, AGPCNP-BC, AOCNP 02/20/21

## 2021-02-21 DIAGNOSIS — I5021 Acute systolic (congestive) heart failure: Secondary | ICD-10-CM | POA: Diagnosis not present

## 2021-02-21 DIAGNOSIS — I248 Other forms of acute ischemic heart disease: Secondary | ICD-10-CM | POA: Diagnosis present

## 2021-02-21 DIAGNOSIS — I4891 Unspecified atrial fibrillation: Secondary | ICD-10-CM | POA: Diagnosis not present

## 2021-02-21 LAB — CBC
HCT: 23.1 % — ABNORMAL LOW (ref 39.0–52.0)
Hemoglobin: 7.7 g/dL — ABNORMAL LOW (ref 13.0–17.0)
MCH: 27.3 pg (ref 26.0–34.0)
MCHC: 33.3 g/dL (ref 30.0–36.0)
MCV: 81.9 fL (ref 80.0–100.0)
Platelets: 48 10*3/uL — ABNORMAL LOW (ref 150–400)
RBC: 2.82 MIL/uL — ABNORMAL LOW (ref 4.22–5.81)
RDW: 15 % (ref 11.5–15.5)
WBC: 4.3 10*3/uL (ref 4.0–10.5)
nRBC: 0 % (ref 0.0–0.2)

## 2021-02-21 LAB — TYPE AND SCREEN
ABO/RH(D): O POS
Antibody Screen: NEGATIVE
Unit division: 0
Unit division: 0

## 2021-02-21 LAB — BPAM RBC
Blood Product Expiration Date: 202209262359
Blood Product Expiration Date: 202210242359
ISSUE DATE / TIME: 202209201340
ISSUE DATE / TIME: 202209221323
Unit Type and Rh: 5100
Unit Type and Rh: 5100

## 2021-02-21 LAB — PATHOLOGIST SMEAR REVIEW

## 2021-02-21 MED ORDER — CHLORHEXIDINE GLUCONATE CLOTH 2 % EX PADS
6.0000 | MEDICATED_PAD | Freq: Every day | CUTANEOUS | Status: DC
  Administered 2021-02-21: 6 via TOPICAL

## 2021-02-21 MED ORDER — CHLORHEXIDINE GLUCONATE CLOTH 2 % EX PADS: 6.0000 | MEDICATED_PAD | Freq: Every day | CUTANEOUS | Status: DC

## 2021-02-21 MED ORDER — CYANOCOBALAMIN 1000 MCG/ML IJ SOLN
1000.0000 ug | Freq: Once | INTRAMUSCULAR | Status: AC
  Administered 2021-02-21: 1000 ug via SUBCUTANEOUS
  Filled 2021-02-21: qty 1

## 2021-02-21 NOTE — Progress Notes (Signed)
Cardiology Progress Note  Patient ID: Antonio Keller MRN: [id number] DOB: 1961-04-11 Date of Encounter: 02/21/2021  Primary Cardiologist: None  Subjective   Denies any chest pain or dyspnea.  Inpatient Medications  Scheduled Meds:  sodium chloride   Intravenous Once   amiodarone  400 mg Oral BID   Followed by   Derrill Memo ON 02/23/2021] amiodarone  200 mg Oral Daily   atorvastatin  40 mg Oral Daily   calcitRIOL  0.25 mcg Oral Daily   calcium acetate  1,334 mg Oral TID WC   calcium carbonate  1 tablet Oral TID WC   carvedilol  25 mg Oral BID WC   Chlorhexidine Gluconate Cloth  6 each Topical Daily   Chlorhexidine Gluconate Cloth  6 each Topical Q0600   enoxaparin (LOVENOX) injection  30 mg Subcutaneous Q24H   hydrALAZINE  100 mg Oral Q12H   isosorbide mononitrate  30 mg Oral Daily   sodium chloride flush  10-40 mL Intracatheter Q12H   Continuous Infusions:   PRN Meds: docusate sodium, heparin sodium (porcine), lidocaine, polyethylene glycol, sodium chloride flush   Vital Signs   Vitals:   02/21/21 0500 02/21/21 0600 02/21/21 0700 02/21/21 0750  BP: 114/68 (!) 137/98 (!) 131/91   Pulse: 62 62 63   Resp: (!) 9 12 (!) 9   Temp:    98 F (36.7 C)  TempSrc:    Oral  SpO2: 97% 100% 99%   Weight: 84 kg       Intake/Output Summary (Last 24 hours) at 02/21/2021 1144 Last data filed at 02/21/2021 0300 Gross per 24 hour  Intake 360 ml  Output 250 ml  Net 110 ml    Last 3 Weights 02/21/2021 02/19/2021 02/18/2021  Weight (lbs) 185 lb 3 oz (No Data) 182 lb 5.1 oz  Weight (kg) 84 kg (No Data) 82.7 kg      Telemetry  NSR in 60s, no Afib which I personally reviewed.   ECG  NSR, rate 63, LVH with repolarization abnormalities, QTc 523 which I personally reviewed.   Physical Exam   Vitals:   02/21/21 0500 02/21/21 0600 02/21/21 0700 02/21/21 0750  BP: 114/68 (!) 137/98 (!) 131/91   Pulse: 62 62 63   Resp: (!) 9 12 (!) 9   Temp:    98 F (36.7 C)  TempSrc:    Oral  SpO2:  97% 100% 99%   Weight: 84 kg       Intake/Output Summary (Last 24 hours) at 02/21/2021 1144 Last data filed at 02/21/2021 0300 Gross per 24 hour  Intake 360 ml  Output 250 ml  Net 110 ml     Last 3 Weights 02/21/2021 02/19/2021 02/18/2021  Weight (lbs) 185 lb 3 oz (No Data) 182 lb 5.1 oz  Weight (kg) 84 kg (No Data) 82.7 kg    Body mass index is 25.12 kg/m.   General: Well nourished, well developed, in no acute distress Head: Atraumatic, normal size  Eyes: PEERLA, EOMI  Neck: Supple, no JVD Cardiac: Normal S1, S2; RRR; no murmurs, rubs, or gallops Lungs: CTAB Abd: Soft, nontender, no hepatomegaly  Ext: No edema Musculoskeletal: No deformities Skin: Warm and dry  Neuro: Alert and oriented to person, place, time, and situation Psych: Normal mood and affect   Labs  High Sensitivity Troponin:   Recent Labs  Lab 02/14/21 1954 02/14/21 2216  TROPONINIHS 99* 82*      Cardiac EnzymesNo results for input(s): TROPONINI in the last 168 hours. No results  for input(s): TROPIPOC in the last 168 hours.  Chemistry Recent Labs  Lab 02/14/21 1611 02/14/21 2102 02/17/21 2152 02/18/21 0247 02/19/21 0208 02/20/21 0153  NA 135   < > 135 135 135 134*  K 4.2   < > 3.0* 3.1* 3.2* 3.6  CL 101   < > 99 95* 97* 97*  CO2 13*   < > '23 23 23 26  '$ GLUCOSE 89   < > 117* 103* 121* 129*  BUN 180*   < > 92* 95* 106* 72*  CREATININE 27.29*   < > 16.65* 17.11* 17.68* 13.41*  CALCIUM 6.5*   < > 7.4* 7.4* 7.6* 8.0*  PROT 6.9  --  6.0*  --   --  5.9*  ALBUMIN 3.6  --  3.0*  --  2.9* 2.8*  AST 36  --  53*  --   --  43*  ALT 29  --  41  --   --  42  ALKPHOS 79  --  80  --   --  70  BILITOT 0.7  --  0.5  --   --  0.3  GFRNONAA 2*   < > 3* 3* 3* 4*  ANIONGAP 21*   < > 13 17* 15 11   < > = values in this interval not displayed.     Hematology Recent Labs  Lab 02/19/21 0208 02/19/21 1652 02/20/21 0153 02/20/21 1829 02/21/21 0245  WBC 4.0  --  4.0  --  4.3  RBC 2.74* 2.76* 2.59*  --  2.82*   HGB 7.4*  --  7.0* 7.6* 7.7*  HCT 22.2*  --  21.0* 23.4* 23.1*  MCV 81.0  --  81.1  --  81.9  MCH 27.0  --  27.0  --  27.3  MCHC 33.3  --  33.3  --  33.3  RDW 15.1  --  15.2  --  15.0  PLT 45* 46* 43*  --  48*    BNPNo results for input(s): BNP, PROBNP in the last 168 hours.  DDimer  Recent Labs  Lab 02/19/21 1652  DDIMER 5.53*      Radiology  No results found.  Cardiac Studies  TTE 02/15/2021  1. Left ventricular ejection fraction, by estimation, is 40 to 45%. The  left ventricle has mildly decreased function. The left ventricle has no  regional wall motion abnormalities. There is severe concentric left  ventricular hypertrophy. Left ventricular   diastolic parameters are consistent with Grade II diastolic dysfunction  (pseudonormalization). Elevated left ventricular end-diastolic pressure.   2. Right ventricular systolic function is normal. The right ventricular  size is normal. Tricuspid regurgitation signal is inadequate for assessing  PA pressure.   3. Left atrial size was severely dilated.   4. Right atrial size was mildly dilated.   5. The mitral valve is grossly normal. Mild mitral valve regurgitation.   6. The aortic valve is tricuspid. There is mild thickening of the aortic  valve. Aortic valve regurgitation is trivial. No aortic stenosis is  present. Aortic valve mean gradient measures 6.0 mmHg.   7. Aortic dilatation noted. There is mild dilatation of the aortic root,  measuring 41 mm.   8. The inferior vena cava is normal in size with <50% respiratory  variability, suggesting right atrial pressure of 8 mmHg.   Patient Profile  Antonio Keller is a 60 y.o. male with hypertension, CKD stage V who was admitted on 02/15/2021 with nausea vomiting with progression  of kidney disease requiring initiation of hemodialysis.  Cardiology was consulted for atrial fibrillation as well as systolic heart failure.  Assessment & Plan   #New onset atrial  fibrillation -CHADSVASC=2 (HTN, CHF).  -Suspect his critical illness drove his atrial fibrillation.  He is converted back to sinus rhythm.  Started on amiodarone with plan for 400 mg twice daily for 7 days and then 200 mg daily for 21 days.   -He does merit anticoagulation with a new diagnosis of systolic heart failure.  He was started on heparin drip but developed bleeding around tunneled catheter site.  Hemoglobin down to 6.4 and platelets dropped down to 45.  Would hold off on anticoagulation at this time.  Can revisit if improvement in anemia/thrombocytopenia.    #New onset systolic heart failure, EF 40-45% -Severe concentric LVH.  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  Continue with dialysis for volume removal. -He is on Coreg 25 mg twice daily. -Not a candidate for ACE/ARB/Arni/MRA given ESRD. -He is on hydralazine 100 mg 2 times daily and Imdur 30 mg daily -Not a candidate for SGLT2 inhibitor currently.  #QTc prolongation -He has LVH with repolarization abnormality, QTc 523 on EKG 9/19.  Will monitor  #Elevated troponin -Suspect demand ischemia.  Do not suspect an acute coronary syndrome.  Suspect this was minimally elevated in the setting of severe metabolic acidosis as well as hypertensive heart disease. -Can pursue outpatient cardiac stress test  -Discontinued aspirin given anemia/thrombocytopenia.  -Have added a statin.  #Anion gap metabolic acidosis #CKD stage V now hemodialysis -Corrected with hemodialysis.  Per nephrology.  [facility] will sign off.   Medication Recommendations: Carvedilol 25 mg twice daily, Imdur 30 mg daily, hydralazine 100 mg twice daily, atorvastatin 40 mg daily.  Hold off on anticoagulation given thrombocytopenia, can readdress as outpatient Other recommendations (labs, testing, etc): None Follow up as an outpatient: Scheduled for 03/14/21   For questions or updates, please contact [place] Please consult [email or web address] for contact  info under    Donato Heinz, MD

## 2021-02-21 NOTE — Progress Notes (Signed)
[facility] ROUNDING NOTE   Subjective:   Interval History: 60 year old male presented to the emergency room with nausea vomiting abdominal pain.  Multiple courses of emesis.  In the emergency room developed A. fib with rapid ventricular rate.  He is a new start to dialysis.  He has positive hepatitis B antigen.  He has a history of congestive heart failure systolic dysfunction ejection fraction 40-45%.  Blood pressure blood pressure 131/91 pulse 64 temperature 98.2 O2 sats 100%.  t..  Does have some urine output.  His next dialysis be 02/21/2021   Sodium 134 potassium 3.6 chloride 97 CO2 26 BUN 72 creatinine 13 glucose 129 calcium 8          Objective:  Vital signs in last 24 hours:  Temp:  [98.2 F (36.8 C)-98.7 F (37.1 C)] 98.2 F (36.8 C) (09/23 0359) Pulse Rate:  [60-67] 63 (09/23 0700) Resp:  [0-16] 9 (09/23 0700) BP: (111-144)/(61-98) 131/91 (09/23 0700) SpO2:  [95 %-100 %] 99 % (09/23 0700) Weight:  [84 kg] 84 kg (09/23 0500)  Weight change:  Filed Weights   02/17/21 1505 02/18/21 0500 02/21/21 0500  Weight: 82.7 kg 82.7 kg 84 kg    Intake/Output: I/O last 3 completed shifts: In: 360 [P.O.:360] Out: 250 [Urine:250]   Intake/Output this shift:  No intake/output data recorded.  Gen:nad CVS:rrr Resp:normal wob [id number] Ext:no edema Neuro: no focal deficits HD access: [facility]   Basic Metabolic Panel: Recent Labs  Lab 02/14/21 1954 02/14/21 2102 02/15/21 0618 02/16/21 0154 02/17/21 0008 02/17/21 2152 02/18/21 0247 02/19/21 0208 02/20/21 0153  NA  --    < > 135 136 134* 135 135 135 134*  K  --    < > 3.5 3.0* 3.0* 3.0* 3.1* 3.2* 3.6  CL  --   --  99 96* 94* 99 95* 97* 97*  CO2  --   --  16* 19* '22 23 23 23 26  '$ GLUCOSE  --   --  129* 139* 102* 117* 103* 121* 129*  BUN  --   --  177* 177* 126* 92* 95* 106* 72*  CREATININE  --   --  27.50* 26.48* 20.26* 16.65* 17.11* 17.68* 13.41*  CALCIUM  --   --  6.5* 6.0* 6.6*  6.4* 7.4* 7.4*  7.6* 8.0*  MG 2.0  --  2.0 1.9 1.7 1.7  --   --   --   PHOS  --   --  10.4* 9.4* 6.5*  --   --  6.5*  --    < > = values in this interval not displayed.     Liver Function Tests: Recent Labs  Lab 02/14/21 1611 02/17/21 2152 02/19/21 0208 02/20/21 0153  AST 36 53*  --  43*  ALT 29 41  --  42  ALKPHOS 79 80  --  70  BILITOT 0.7 0.5  --  0.3  PROT 6.9 6.0*  --  5.9*  ALBUMIN 3.6 3.0* 2.9* 2.8*    Recent Labs  Lab 02/14/21 1954 02/15/21 0618  LIPASE 216* 378*    No results for input(s): AMMONIA in the last 168 hours.  CBC: Recent Labs  Lab 02/14/21 1611 02/14/21 2102 02/16/21 0154 02/18/21 0247 02/18/21 2100 02/19/21 0208 02/19/21 1652 02/20/21 0153 02/20/21 1829 02/21/21 0245  WBC 3.2*   < > 3.6* 3.9*  --  4.0  --  4.0  --  4.3  NEUTROABS 1.6*  --   --   --   --   --   --   --   --   --  HGB 7.8*   < > 7.0* 6.4* 7.2* 7.4*  --  7.0* 7.6* 7.7*  HCT 23.6*   < > 20.4* 19.6* 21.5* 22.2*  --  21.0* 23.4* 23.1*  MCV 80.8   < > 77.6* 80.7  --  81.0  --  81.1  --  81.9  PLT 82*   < > 68* 48*  --  45* 46* 43*  --  48*   < > = values in this interval not displayed.     Cardiac Enzymes: No results for input(s): CKTOTAL, CKMB, CKMBINDEX, TROPONINI in the last 168 hours.  BNP: Invalid input(s): POCBNP  CBG: Recent Labs  Lab 02/16/21 2019 02/16/21 2308 02/17/21 0455 02/17/21 1931 02/18/21 0045  GLUCAP 108* 129* 100* 141* 103*     Microbiology: Results for orders placed or performed during the hospital encounter of 02/14/21  Resp Panel by RT-PCR (Flu A&B, Covid) Nasopharyngeal Swab     Status: None   Collection Time: 02/14/21  7:17 PM   Specimen: Nasopharyngeal Swab; Nasopharyngeal(NP) swabs in vial transport medium  Result Value Ref Range Status   SARS Coronavirus 2 by RT PCR NEGATIVE NEGATIVE Final    Comment: (NOTE) SARS-CoV-2 target nucleic acids are NOT DETECTED.  The SARS-CoV-2 RNA is generally detectable in upper respiratory specimens during the  acute phase of infection. The lowest concentration of SARS-CoV-2 viral copies this assay can detect is 138 copies/mL. A negative result does not preclude SARS-Cov-2 infection and should not be used as the sole basis for treatment or other patient management decisions. A negative result may occur with  improper specimen collection/handling, submission of specimen other than nasopharyngeal swab, presence of viral mutation(s) within the areas targeted by this assay, and inadequate number of viral copies(<138 copies/mL). A negative result must be combined with clinical observations, patient history, and epidemiological information. The expected result is Negative.  Fact Sheet for Patients:  EntrepreneurPulse.com.au  Fact Sheet for Healthcare Providers:  IncredibleEmployment.be  This test is no t yet approved or cleared by the [place] FDA and  has been authorized for detection and/or diagnosis of SARS-CoV-2 by FDA under an Emergency Use Authorization (EUA). This EUA will remain  in effect (meaning this test can be used) for the duration of the COVID-19 declaration under Section 564(b)(1) of the Act, 21 U.S.C.section 360bbb-3(b)(1), unless the authorization is terminated  or revoked sooner.       Influenza A by PCR NEGATIVE NEGATIVE Final   Influenza B by PCR NEGATIVE NEGATIVE Final    Comment: (NOTE) The Xpert Xpress SARS-CoV-2/FLU/RSV plus assay is intended as an aid in the diagnosis of influenza from Nasopharyngeal swab specimens and should not be used as a sole basis for treatment. Nasal washings and aspirates are unacceptable for Xpert Xpress SARS-CoV-2/FLU/RSV testing.  Fact Sheet for Patients: EntrepreneurPulse.com.au  Fact Sheet for Healthcare Providers: IncredibleEmployment.be  This test is not yet approved or cleared by the [place] FDA and has been authorized for detection and/or  diagnosis of SARS-CoV-2 by FDA under an Emergency Use Authorization (EUA). This EUA will remain in effect (meaning this test can be used) for the duration of the COVID-19 declaration under Section 564(b)(1) of the Act, 21 U.S.C. section 360bbb-3(b)(1), unless the authorization is terminated or revoked.  Performed at [facility], [place] [address], [place], [place] [address]   MRSA Next Gen by PCR, Nasal     Status: None   Collection Time: 02/15/21 12:38 AM   Specimen: Nasal Mucosa;  Nasal Swab  Result Value Ref Range Status   MRSA by PCR Next Gen NOT DETECTED NOT DETECTED Final    Comment: (NOTE) The GeneXpert MRSA Assay (FDA approved for NASAL specimens only), is one component of a comprehensive MRSA colonization surveillance program. It is not intended to diagnose MRSA infection nor to guide or monitor treatment for MRSA infections. Test performance is not FDA approved in patients less than 64 years old. Performed at [facility], [place] [address], [place], [place] [address]     Coagulation Studies: Recent Labs    02/19/21 1652  LABPROT 15.7*  INR 1.3*     Urinalysis: No results for input(s): COLORURINE, LABSPEC, PHURINE, GLUCOSEU, HGBUR, BILIRUBINUR, KETONESUR, PROTEINUR, UROBILINOGEN, NITRITE, LEUKOCYTESUR in the last 72 hours.  Invalid input(s): APPERANCEUR    Imaging: No results found.   Medications:      sodium chloride   Intravenous Once   amiodarone  400 mg Oral BID   Followed by   Derrill Memo ON 02/23/2021] amiodarone  200 mg Oral Daily   atorvastatin  40 mg Oral Daily   calcitRIOL  0.25 mcg Oral Daily   calcium acetate  1,334 mg Oral TID WC   calcium carbonate  1 tablet Oral TID WC   carvedilol  25 mg Oral BID WC   Chlorhexidine Gluconate Cloth  6 each Topical Daily   enoxaparin (LOVENOX) injection  30 mg Subcutaneous Q24H   hydrALAZINE  100 mg Oral Q12H   isosorbide mononitrate  30 mg Oral Daily   sodium chloride flush  10-40 mL Intracatheter  Q12H   docusate sodium, heparin sodium (porcine), lidocaine, polyethylene glycol, sodium chloride flush  Assessment/ Plan:  CKD5, 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 -started HD for uremic symptoms.  02/16/2021, 02/17/2021.    02/19/2021.  Next dialysis will be 02/21/2021 -Consulted VVS for permanent access, appreciate assistance.  Patient has a center at [facility] clinic Monday Wednesday Friday afternoon.  Appreciate assistance of [employer].  Patient has been evaluated by vein and vascular surgery.  They are hesitant to place AV fistula at this point as he remains pancytopenic.  He has a right tunneled dialysis catheter      Hypocalcemia resolved.  Patient with hypophosphatemia continue binders   Afib w/ RVR -cardio on board, currently on amio   Hypertension: -home meds on hold, BP acceptable   Metabolic acidosis, metabolic acidemia -Improved with dialysis   Anemia due to chronic kidney disease: -Transfuse for Hgb<7 g/dL.    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  We will start vitamin D.  Continues on calcium supplementation.  Follow renal panel daily  Pancytopenia.  Evaluated by Dr. Earlie Server appreciate assistance.  This is limiting his ability to undergo AV fistula placement   LOS: [place] '@TODAY''@7'$ :26 AM

## 2021-02-21 NOTE — Progress Notes (Signed)
PROGRESS NOTE    Antonio Keller  IEP:[id number] DOB: 17-Aug-1960 DOA: 02/14/2021 PCP: Jolinda Croak, MD   Brief Narrative:  Antonio Keller ,is a 60 y.o. male with past medical history of end-stage renal disease, not on dialysis, hypertension, who presented to the [facility] ED with a chief complaint of nausea, vomiting, and abdominal pain ongoing for 2 weeks, worse since 1 week.  Became increasingly fatigued so he called EMS on 02/14/2021.   ED course was notable for ABG of 7.22/30/114/12.5, Trop 99, MG 2.0, K 4.1, HGB 8.5, lipase 216. Lactate 0.7, AG 21, BUN 180 While in the ED he developed afib with RVR and was started on an amio gtt. he was admitted under ICU.  Neph and Cardiology were consulted by the ED.  Transferred under [facility] on 02/17/2021.  Assessment & Plan:   Active Problems:   Essential hypertension   Nausea and vomiting   New onset atrial fibrillation (HCC)   ESRD (end stage renal disease) (HCC)   Thrombocytopenia (HCC)   Anemia of chronic disease  End-stage renal disease, newly started on hemodialysis/High anion gap metabolic acidosis: Nephrology following.  Brocket placed on 02/15/2021 by IR.  Received first session of dialysis on 02/16/2021.  [facility] consulted for permanent access however they prefer his platelets to get better before they do this.  Management per nephrology.  Per reports, he already has outpatient dialysis set up.  Hypokalemia: Renal function pending.  Defer to nephrology.  Acute hepatitis B infection: Hepatitis B surface antigen positive but patient completely asymptomatic and LFTs normal.  No indication to treat acutely.  Will need follow-up with ID as outpatient.  Paroxysmal new onset atrial fibrillation/Prolonged QT: CHADSVASC=2 (HTN, CHF).  Cardiology on board and managing.  Suspected this is critical illness driven.  Converted back to sinus rhythm.  Currently on amiodarone for total of 28 days.  Heparin is stopped due to some bleeding at the dialysis catheter.  Cardiology recommends holding off on heparin for now due to persistent thrombocytopenia and may consider resuming once his platelets are over 50.   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  Per cardiology, this is likely secondary to hypertensive cardiomyopathy.  Per cardiology, he is Not a candidate for ACE/ARB/Arni/MRA given ESRD.  Currently he is on Coreg 25 mg p.o. twice daily, hydralazine 100 mg p.o. twice daily and Imdur 30 mg p.o. daily. Not a candidate for SGLT2 inhibitor currently.   Hypertension: Controlled.  Continue current medications as stated above.   Pancytopenia: Could very well be due to CKD but should seek further work-up for possible other causes.  Oncology consulted and work-up sent and most labs are normal except elevated rheumatoid factor.  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  Initial plan was to obtain bone marrow biopsy but no further plans as of now.  No evidence of DIC or hemolysis.  Awaiting further recommendations from oncology.  Patient is status post 1 unit PRBC transfusion on 02/18/2021.  Hemoglobin improved posttransfusion but hemoglobin down to 7.0 again on 02/20/2021 and received 1 more unit of PRBC transfusion.  Hemoglobin 7.7 today.  He is due for dialysis today.  We will keep him for observation overnight since he has been having issues with dropping hemoglobin requiring blood transfusion every other day.  If hemoglobin stable tomorrow, can consider discharging home.  DVT prophylaxis: enoxaparin (LOVENOX) injection 30 mg Start: 02/16/21 1100  SCDs Start: 02/14/21 2244   Code Status: Full Code  Family Communication:  None present at bedside.  Plan of care discussed with patient in length and he verbalized understanding and agreed with it.  Also discussed with her wife over the phone.   Status is: Inpatient  Remains inpatient appropriate  because:Inpatient level of care appropriate due to severity of illness  Dispo: The patient is from: Home              Anticipated d/c is to: Home              Patient currently is not medically stable to d/c.   Difficult to place patient No   Estimated body mass index is 25.12 kg/m as calculated from the following:   Height as of 12/19/20: 6' (1.829 m).   Weight as of this encounter: 84 kg.     Nutritional Assessment: Body mass index is 25.12 kg/m.Marland Kitchen Seen by dietician.  I agree with the assessment and plan as outlined below: Nutrition Status:   Skin Assessment: I have examined the patient's skin and I agree with the wound assessment as performed by the wound care RN as outlined below:   Consultants:  Nephrology and cardiology  Procedures:  [facility] placement 02/15/2021.  Antimicrobials:  Anti-infectives (From admission, onward)    None          Subjective: Patient seen and examined.  No complaints.  Objective: Vitals:   02/21/21 0500 02/21/21 0600 02/21/21 0700 02/21/21 0750  BP: 114/68 (!) 137/98 (!) 131/91   Pulse: 62 62 63   Resp: (!) 9 12 (!) 9   Temp:    98 F (36.7 C)  TempSrc:    Oral  SpO2: 97% 100% 99%   Weight: 84 kg       Intake/Output Summary (Last 24 hours) at 02/21/2021 1053 Last data filed at 02/21/2021 0300 Gross per 24 hour  Intake 360 ml  Output 250 ml  Net 110 ml    Filed Weights   02/17/21 1505 02/18/21 0500 02/21/21 0500  Weight: 82.7 kg 82.7 kg 84 kg    Examination:  General exam: Appears calm and comfortable  Respiratory system: Clear to auscultation. Respiratory effort normal. Cardiovascular system: S1 & S2 heard, RRR. No JVD, murmurs, rubs, gallops or clicks. No pedal edema. Gastrointestinal system: Abdomen is nondistended, soft and nontender. No organomegaly or masses felt. Normal bowel sounds heard. Central nervous system: Alert and oriented. No focal neurological deficits. Extremities: Symmetric 5 x 5 power. Skin: No  rashes, lesions or ulcers.  Psychiatry: Judgement and insight appear normal. Mood & affect appropriate.    Data Reviewed: I have personally reviewed following labs and imaging studies  CBC: Recent Labs  Lab 02/14/21 1611 02/14/21 2102 02/16/21 0154 02/18/21 0247 02/18/21 2100 02/19/21 0208 02/19/21 1652 02/20/21 0153 02/20/21 1829 02/21/21 0245  WBC 3.2*   < > 3.6* 3.9*  --  4.0  --  4.0  --  4.3  NEUTROABS 1.6*  --   --   --   --   --   --   --   --   --   HGB 7.8*   < > 7.0* 6.4* 7.2* 7.4*  --  7.0* 7.6* 7.7*  HCT 23.6*   < > 20.4* 19.6* 21.5* 22.2*  --  21.0* 23.4* 23.1*  MCV 80.8   < > 77.6* 80.7  --  81.0  --  81.1  --  81.9  PLT 82*   < >  68* 48*  --  45* 46* 43*  --  48*   < > = values in this interval not displayed.    Basic Metabolic Panel: Recent Labs  Lab 02/14/21 1954 02/14/21 2102 02/15/21 0211 02/16/21 0154 02/17/21 0008 02/17/21 2152 02/18/21 0247 02/19/21 0208 02/20/21 0153  NA  --    < > 135 136 134* 135 135 135 134*  K  --    < > 3.5 3.0* 3.0* 3.0* 3.1* 3.2* 3.6  CL  --   --  99 96* 94* 99 95* 97* 97*  CO2  --   --  16* 19* 22 23 23 23 26   GLUCOSE  --   --  129* 139* 102* 117* 103* 121* 129*  BUN  --   --  177* 177* 126* 92* 95* 106* 72*  CREATININE  --   --  27.50* 26.48* 20.26* 16.65* 17.11* 17.68* 13.41*  CALCIUM  --   --  6.5* 6.0* 6.6*  6.4* 7.4* 7.4* 7.6* 8.0*  MG 2.0  --  2.0 1.9 1.7 1.7  --   --   --   PHOS  --   --  10.4* 9.4* 6.5*  --   --  6.5*  --    < > = values in this interval not displayed.    GFR: Estimated Creatinine Clearance: 6.4 mL/min (A) (by C-G formula based on SCr of 13.41 mg/dL (H)). Liver Function Tests: Recent Labs  Lab 02/14/21 1611 02/17/21 2152 02/19/21 0208 02/20/21 0153  AST 36 53*  --  43*  ALT 29 41  --  42  ALKPHOS 79 80  --  70  BILITOT 0.7 0.5  --  0.3  PROT 6.9 6.0*  --  5.9*  ALBUMIN 3.6 3.0* 2.9* 2.8*    Recent Labs  Lab 02/14/21 1954 02/15/21 0618  LIPASE 216* 378*    No results  for input(s): AMMONIA in the last 168 hours. Coagulation Profile: Recent Labs  Lab 02/14/21 1954 02/19/21 1652  INR 1.3* 1.3*    Cardiac Enzymes: No results for input(s): CKTOTAL, CKMB, CKMBINDEX, TROPONINI in the last 168 hours. BNP (last 3 results) No results for input(s): PROBNP in the last 8760 hours. HbA1C: No results for input(s): HGBA1C in the last 72 hours. CBG: Recent Labs  Lab 02/16/21 2019 02/16/21 2308 02/17/21 0455 02/17/21 1931 02/18/21 0045  GLUCAP 108* 129* 100* 141* 103*    Lipid Profile: No results for input(s): CHOL, HDL, LDLCALC, TRIG, CHOLHDL, LDLDIRECT in the last 72 hours.  Thyroid Function Tests: No results for input(s): TSH, T4TOTAL, FREET4, T3FREE, THYROIDAB in the last 72 hours. Anemia Panel: Recent Labs    02/19/21 1211 02/19/21 1652  VITAMINB12 285  --   FOLATE 7.8  --   FERRITIN 347*  --   TIBC 259  --   IRON 72  --   RETICCTPCT  --  0.5    Sepsis Labs: Recent Labs  Lab 02/14/21 1611  LATICACIDVEN 0.7     Recent Results (from the past 240 hour(s))  Resp Panel by RT-PCR (Flu A&B, Covid) Nasopharyngeal Swab     Status: None   Collection Time: 02/14/21  7:17 PM   Specimen: Nasopharyngeal Swab; Nasopharyngeal(NP) swabs in vial transport medium  Result Value Ref Range Status   SARS Coronavirus 2 by RT PCR NEGATIVE NEGATIVE Final    Comment: (NOTE) SARS-CoV-2 target nucleic acids are NOT DETECTED.  The SARS-CoV-2 RNA is generally detectable in upper respiratory specimens  during the acute phase of infection. The lowest concentration of SARS-CoV-2 viral copies this assay can detect is 138 copies/mL. A negative result does not preclude SARS-Cov-2 infection and should not be used as the sole basis for treatment or other patient management decisions. A negative result may occur with  improper specimen collection/handling, submission of specimen other than nasopharyngeal swab, presence of viral mutation(s) within the areas  targeted by this assay, and inadequate number of viral copies(<138 copies/mL). A negative result must be combined with clinical observations, patient history, and epidemiological information. The expected result is Negative.  Fact Sheet for Patients:  EntrepreneurPulse.com.au  Fact Sheet for Healthcare Providers:  IncredibleEmployment.be  This test is no t yet approved or cleared by the [place] FDA and  has been authorized for detection and/or diagnosis of SARS-CoV-2 by FDA under an Emergency Use Authorization (EUA). This EUA will remain  in effect (meaning this test can be used) for the duration of the COVID-19 declaration under Section 564(b)(1) of the Act, 21 U.S.C.section 360bbb-3(b)(1), unless the authorization is terminated  or revoked sooner.       Influenza A by PCR NEGATIVE NEGATIVE Final   Influenza B by PCR NEGATIVE NEGATIVE Final    Comment: (NOTE) The Xpert Xpress SARS-CoV-2/FLU/RSV plus assay is intended as an aid in the diagnosis of influenza from Nasopharyngeal swab specimens and should not be used as a sole basis for treatment. Nasal washings and aspirates are unacceptable for Xpert Xpress SARS-CoV-2/FLU/RSV testing.  Fact Sheet for Patients: EntrepreneurPulse.com.au  Fact Sheet for Healthcare Providers: IncredibleEmployment.be  This test is not yet approved or cleared by the [place] FDA and has been authorized for detection and/or diagnosis of SARS-CoV-2 by FDA under an Emergency Use Authorization (EUA). This EUA will remain in effect (meaning this test can be used) for the duration of the COVID-19 declaration under Section 564(b)(1) of the Act, 21 U.S.C. section 360bbb-3(b)(1), unless the authorization is terminated or revoked.  Performed at [facility], [place] [address], [place], [place] [address]   MRSA Next Gen by PCR, Nasal     Status: None   Collection Time:  02/15/21 12:38 AM   Specimen: Nasal Mucosa; Nasal Swab  Result Value Ref Range Status   MRSA by PCR Next Gen NOT DETECTED NOT DETECTED Final    Comment: (NOTE) The GeneXpert MRSA Assay (FDA approved for NASAL specimens only), is one component of a comprehensive MRSA colonization surveillance program. It is not intended to diagnose MRSA infection nor to guide or monitor treatment for MRSA infections. Test performance is not FDA approved in patients less than 50 years old. Performed at [facility], [place] [address], [place], [place] [address]        Radiology Studies: No results found.  Scheduled Meds:  sodium chloride   Intravenous Once   amiodarone  400 mg Oral BID   Followed by   Derrill Memo ON 02/23/2021] amiodarone  200 mg Oral Daily   atorvastatin  40 mg Oral Daily   calcitRIOL  0.25 mcg Oral Daily   calcium acetate  1,334 mg Oral TID WC   calcium carbonate  1 tablet Oral TID WC   carvedilol  25 mg Oral BID WC   Chlorhexidine Gluconate Cloth  6 each Topical Daily   Chlorhexidine Gluconate Cloth  6 each Topical Q0600   enoxaparin (LOVENOX) injection  30 mg Subcutaneous Q24H   hydrALAZINE  100 mg Oral Q12H   isosorbide mononitrate  30 mg  Oral Daily   sodium chloride flush  10-40 mL Intracatheter Q12H   Continuous Infusions:     LOS: 7 days   Time spent: 27 minutes   Darliss Cheney, MD [facility]  02/21/2021, 10:53 AM  Please page via [facility] [place] and do not message via secure chat for anything urgent. Secure chat can be used for anything non urgent.  How to contact the [facility] Attending or Consulting provider [place] or covering provider during after hours [place], for this patient?  Check the care team in [facility] and look for a) attending/consulting [facility] provider listed and b) the [facility] team listed. Page or secure chat [phone]. Log into [email or web address] and use [place] universal password to access. If you do not have the password, please contact the hospital  operator. Locate the [facility] provider you are looking for under [facility] and page to a number that you can be directly reached. If you still have difficulty reaching the provider, please page the [facility] (Director on Call) for the Hospitalists listed on amion for assistance.

## 2021-02-21 NOTE — Progress Notes (Signed)
Brief Hematology Note:  Additional labs reviewed. RF mildly elevated, ANA normal, Haptoglobin normal. Vitamin B12 level 295 (goal ~400). Multiple myeloma panel is pending at this time.   CBC from today: CBC    Component Value Date/Time   WBC 4.3 02/21/2021 0245   RBC 2.82 (L) 02/21/2021 0245   HGB 7.7 (L) 02/21/2021 0245   HCT 23.1 (L) 02/21/2021 0245   PLT 48 (L) 02/21/2021 0245   MCV 81.9 02/21/2021 0245   MCH 27.3 02/21/2021 0245   MCHC 33.3 02/21/2021 0245   RDW 15.0 02/21/2021 0245   LYMPHSABS 1.2 02/14/2021 1611   MONOABS 0.4 02/14/2021 1611   EOSABS 0.0 02/14/2021 1611   BASOSABS 0.0 02/14/2021 1611   ASSESSMENT AND PLAN: This is a very pleasant 60 year old African male with mainly anemia and thrombocytopenia.  His leukocyte count are within the low normal range.  His anemia and partially thrombocytopenia can be definitely contributed to his end-stage renal disease as well as the other comorbidities including hepatitis B.   He has no evidence of iron deficiency.  Folate level adequate.   There is no evidence of DIC or hemolysis.  Myeloma panel is pending. Vitamin B12 level is 295 which is normal, but would aim for goal of ~400. Will administer Vitamin B12 1000 mcg sq X 1 dose today.    For now, will will continue to hold off on obtaining a bone marrow biopsy to see if counts continue to improve. His anemia and thrombocytopenia may likely be due to his end-stage renal disease and other comorbid conditions including his hepatitis B.  Defer decision about additional work-up of his hepatitis B and evaluation for possible cirrhosis/splenomegaly to primary team.  I note plan for possible discharge over the weekend I will arrange for outpatient follow up at the cancer center to follow counts and to follow up on multiple myeloma panel.

## 2021-02-22 DIAGNOSIS — B1910 Unspecified viral hepatitis B without hepatic coma: Secondary | ICD-10-CM

## 2021-02-22 DIAGNOSIS — I10 Essential (primary) hypertension: Secondary | ICD-10-CM

## 2021-02-22 DIAGNOSIS — D61818 Other pancytopenia: Secondary | ICD-10-CM

## 2021-02-22 DIAGNOSIS — I5041 Acute combined systolic (congestive) and diastolic (congestive) heart failure: Secondary | ICD-10-CM

## 2021-02-22 LAB — HEPATITIS B CORE ANTIBODY, IGM: Hep B C IgM: NONREACTIVE

## 2021-02-22 LAB — CBC
HCT: 22.6 % — ABNORMAL LOW (ref 39.0–52.0)
Hemoglobin: 7.5 g/dL — ABNORMAL LOW (ref 13.0–17.0)
MCH: 27.5 pg (ref 26.0–34.0)
MCHC: 33.2 g/dL (ref 30.0–36.0)
MCV: 82.8 fL (ref 80.0–100.0)
Platelets: 55 10*3/uL — ABNORMAL LOW (ref 150–400)
RBC: 2.73 MIL/uL — ABNORMAL LOW (ref 4.22–5.81)
RDW: 15.2 % (ref 11.5–15.5)
WBC: 4.2 10*3/uL (ref 4.0–10.5)
nRBC: 0 % (ref 0.0–0.2)

## 2021-02-22 LAB — HEPATITIS B CORE ANTIBODY, TOTAL: Hep B Core Total Ab: REACTIVE — AB

## 2021-02-22 MED ORDER — CALCIUM CARBONATE 1250 (500 CA) MG PO TABS: 1.0000 | ORAL_TABLET | Freq: Three times a day (TID) | ORAL | 1 refills | Status: DC

## 2021-02-22 MED ORDER — SENNOSIDES-DOCUSATE SODIUM 8.6-50 MG PO TABS: 1.0000 | ORAL_TABLET | Freq: Two times a day (BID) | ORAL | 0 refills | Status: DC | PRN

## 2021-02-22 MED ORDER — AMIODARONE HCL 200 MG PO TABS: ORAL_TABLET | ORAL | 0 refills | Status: DC

## 2021-02-22 MED ORDER — COLCHICINE 0.6 MG PO TABS: 0.6000 mg | ORAL_TABLET | Freq: Every day | ORAL | 1 refills | Status: AC

## 2021-02-22 MED ORDER — CALCITRIOL 0.25 MCG PO CAPS: 0.2500 ug | ORAL_CAPSULE | Freq: Every day | ORAL | 1 refills | Status: AC

## 2021-02-22 MED ORDER — ATORVASTATIN CALCIUM 40 MG PO TABS: 40.0000 mg | ORAL_TABLET | Freq: Every day | ORAL | 1 refills | Status: AC

## 2021-02-22 MED ORDER — ALLOPURINOL 100 MG PO TABS: 50.0000 mg | ORAL_TABLET | Freq: Every day | ORAL | 1 refills | Status: AC

## 2021-02-22 MED ORDER — HEPARIN SODIUM (PORCINE) 1000 UNIT/ML IJ SOLN
INTRAMUSCULAR | Status: AC
  Filled 2021-02-22: qty 4

## 2021-02-22 MED ORDER — FERROUS SULFATE 325 (65 FE) MG PO TABS: 325.0000 mg | ORAL_TABLET | Freq: Every day | ORAL | 3 refills | Status: AC

## 2021-02-22 MED ORDER — ISOSORBIDE MONONITRATE ER 30 MG PO TB24: 30.0000 mg | ORAL_TABLET | Freq: Every day | ORAL | 1 refills | Status: DC

## 2021-02-22 MED ORDER — CALCIUM ACETATE (PHOS BINDER) 667 MG PO CAPS: 1334.0000 mg | ORAL_CAPSULE | Freq: Three times a day (TID) | ORAL | 1 refills | Status: DC

## 2021-02-22 NOTE — Progress Notes (Signed)
[facility] ROUNDING NOTE   Subjective:   Interval History: 60 year old male presented to the emergency room with nausea vomiting abdominal pain.  Multiple courses of emesis.  In the emergency room developed A. fib with rapid ventricular rate.  He is a new start to dialysis.  He has positive hepatitis B antigen.  He has a history of congestive heart failure systolic dysfunction ejection fraction 40-45%.  Blood pressure 136/77 pulse 68 temperature 98.2 O2 sats 98% room air    does have some urine output.  Underwent dialysis 02/22/2021 with 2.6 L removed.  This was after midnight.  He was scheduled for 02/21/2021 but there were high dialysis volumes   No recent renal panel will need daily.   Objective:  Vital signs in last 24 hours:  Temp:  [98 F (36.7 C)-98.4 F (36.9 C)] 98.2 F (36.8 C) (09/24 0748) Pulse Rate:  [60-70] 69 (09/24 0800) Resp:  [0-17] 10 (09/24 0800) BP: (120-174)/(73-105) 154/104 (09/24 0800) SpO2:  [96 %-100 %] 99 % (09/24 0800) Weight:  [80.2 kg] 80.2 kg (09/24 0256)  Weight change: -3.8 kg Filed Weights   02/18/21 0500 02/21/21 0500 02/22/21 0256  Weight: 82.7 kg 84 kg 80.2 kg    Intake/Output: I/O last 3 completed shifts: In: 0  Out: 3150 [Urine:650; Other:2500]   Intake/Output this shift:  No intake/output data recorded.  Gen:nad CVS:rrr Resp:normal wob [id number] Ext:no edema Neuro: no focal deficits HD access: [facility]   Basic Metabolic Panel: Recent Labs  Lab 02/16/21 0154 02/17/21 0008 02/17/21 2152 02/18/21 0247 02/19/21 0208 02/20/21 0153  NA 136 134* 135 135 135 134*  K 3.0* 3.0* 3.0* 3.1* 3.2* 3.6  CL 96* 94* 99 95* 97* 97*  CO2 19* '22 23 23 23 26  '$ GLUCOSE 139* 102* 117* 103* 121* 129*  BUN 177* 126* 92* 95* 106* 72*  CREATININE 26.48* 20.26* 16.65* 17.11* 17.68* 13.41*  CALCIUM 6.0* 6.6*  6.4* 7.4* 7.4* 7.6* 8.0*  MG 1.9 1.7 1.7  --   --   --   PHOS 9.4* 6.5*  --   --  6.5*  --      Liver Function  Tests: Recent Labs  Lab 02/17/21 2152 02/19/21 0208 02/20/21 0153  AST 53*  --  43*  ALT 41  --  42  ALKPHOS 80  --  70  BILITOT 0.5  --  0.3  PROT 6.0*  --  5.9*  ALBUMIN 3.0* 2.9* 2.8*    No results for input(s): LIPASE, AMYLASE in the last 168 hours.  No results for input(s): AMMONIA in the last 168 hours.  CBC: Recent Labs  Lab 02/18/21 0247 02/18/21 2100 02/19/21 0208 02/19/21 1652 02/20/21 0153 02/20/21 1829 02/21/21 0245 02/22/21 0217  WBC 3.9*  --  4.0  --  4.0  --  4.3 4.2  HGB 6.4*   < > 7.4*  --  7.0* 7.6* 7.7* 7.5*  HCT 19.6*   < > 22.2*  --  21.0* 23.4* 23.1* 22.6*  MCV 80.7  --  81.0  --  81.1  --  81.9 82.8  PLT 48*  --  45* 46* 43*  --  48* 55*   < > = values in this interval not displayed.     Cardiac Enzymes: No results for input(s): CKTOTAL, CKMB, CKMBINDEX, TROPONINI in the last 168 hours.  BNP: Invalid input(s): POCBNP  CBG: Recent Labs  Lab 02/16/21 2019 02/16/21 2308 02/17/21 0455 02/17/21 1931 02/18/21 0045  GLUCAP  108* 129* 100* 141* 103*     Microbiology: Results for orders placed or performed during the hospital encounter of 02/14/21  Resp Panel by RT-PCR (Flu A&B, Covid) Nasopharyngeal Swab     Status: None   Collection Time: 02/14/21  7:17 PM   Specimen: Nasopharyngeal Swab; Nasopharyngeal(NP) swabs in vial transport medium  Result Value Ref Range Status   SARS Coronavirus 2 by RT PCR NEGATIVE NEGATIVE Final    Comment: (NOTE) SARS-CoV-2 target nucleic acids are NOT DETECTED.  The SARS-CoV-2 RNA is generally detectable in upper respiratory specimens during the acute phase of infection. The lowest concentration of SARS-CoV-2 viral copies this assay can detect is 138 copies/mL. A negative result does not preclude SARS-Cov-2 infection and should not be used as the sole basis for treatment or other patient management decisions. A negative result may occur with  improper specimen collection/handling, submission of  specimen other than nasopharyngeal swab, presence of viral mutation(s) within the areas targeted by this assay, and inadequate number of viral copies(<138 copies/mL). A negative result must be combined with clinical observations, patient history, and epidemiological information. The expected result is Negative.  Fact Sheet for Patients:  EntrepreneurPulse.com.au  Fact Sheet for Healthcare Providers:  IncredibleEmployment.be  This test is no t yet approved or cleared by the [place] FDA and  has been authorized for detection and/or diagnosis of SARS-CoV-2 by FDA under an Emergency Use Authorization (EUA). This EUA will remain  in effect (meaning this test can be used) for the duration of the COVID-19 declaration under Section 564(b)(1) of the Act, 21 U.S.C.section 360bbb-3(b)(1), unless the authorization is terminated  or revoked sooner.       Influenza A by PCR NEGATIVE NEGATIVE Final   Influenza B by PCR NEGATIVE NEGATIVE Final    Comment: (NOTE) The Xpert Xpress SARS-CoV-2/FLU/RSV plus assay is intended as an aid in the diagnosis of influenza from Nasopharyngeal swab specimens and should not be used as a sole basis for treatment. Nasal washings and aspirates are unacceptable for Xpert Xpress SARS-CoV-2/FLU/RSV testing.  Fact Sheet for Patients: EntrepreneurPulse.com.au  Fact Sheet for Healthcare Providers: IncredibleEmployment.be  This test is not yet approved or cleared by the [place] FDA and has been authorized for detection and/or diagnosis of SARS-CoV-2 by FDA under an Emergency Use Authorization (EUA). This EUA will remain in effect (meaning this test can be used) for the duration of the COVID-19 declaration under Section 564(b)(1) of the Act, 21 U.S.C. section 360bbb-3(b)(1), unless the authorization is terminated or revoked.  Performed at [facility], [place] [address],  [place], [place] [address]   MRSA Next Gen by PCR, Nasal     Status: None   Collection Time: 02/15/21 12:38 AM   Specimen: Nasal Mucosa; Nasal Swab  Result Value Ref Range Status   MRSA by PCR Next Gen NOT DETECTED NOT DETECTED Final    Comment: (NOTE) The GeneXpert MRSA Assay (FDA approved for NASAL specimens only), is one component of a comprehensive MRSA colonization surveillance program. It is not intended to diagnose MRSA infection nor to guide or monitor treatment for MRSA infections. Test performance is not FDA approved in patients less than 7 years old. Performed at [facility], [place] [address] [place], [place] [address]     Coagulation Studies: Recent Labs    02/19/21 1652  LABPROT 15.7*  INR 1.3*     Urinalysis: No results for input(s): COLORURINE, LABSPEC, PHURINE, GLUCOSEU, HGBUR, BILIRUBINUR, KETONESUR, PROTEINUR, UROBILINOGEN, NITRITE,  LEUKOCYTESUR in the last 72 hours.  Invalid input(s): APPERANCEUR    Imaging: No results found.   Medications:      sodium chloride   Intravenous Once   amiodarone  400 mg Oral BID   Followed by   Derrill Memo ON 02/23/2021] amiodarone  200 mg Oral Daily   atorvastatin  40 mg Oral Daily   calcitRIOL  0.25 mcg Oral Daily   calcium acetate  1,334 mg Oral TID WC   calcium carbonate  1 tablet Oral TID WC   carvedilol  25 mg Oral BID WC   Chlorhexidine Gluconate Cloth  6 each Topical Q0600   enoxaparin (LOVENOX) injection  30 mg Subcutaneous Q24H   heparin sodium (porcine)       hydrALAZINE  100 mg Oral Q12H   isosorbide mononitrate  30 mg Oral Daily   sodium chloride flush  10-40 mL Intracatheter Q12H   docusate sodium, heparin sodium (porcine), lidocaine, polyethylene glycol, sodium chloride flush  Assessment/ Plan:  CKD5, 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 -started HD for uremic symptoms.  02/16/2021, 02/17/2021.    02/19/2021.  Next dialysis will be  02/21/2021 -Consulted VVS for permanent access, appreciate assistance.  Patient has a center at [facility] clinic Monday Wednesday Friday afternoon.  Appreciate assistance of [employer].  Patient has been evaluated by vein and vascular surgery.  They are hesitant to place AV fistula at this point as he remains pancytopenic.  He has a right tunneled dialysis catheter      Hypocalcemia resolved.  Patient with hypophosphatemia continue binders   Afib w/ RVR -cardio on board, currently on amio   Hypertension: -home meds on hold, BP acceptable   Metabolic acidosis, metabolic acidemia -Improved with dialysis   Anemia due to chronic kidney disease: -Transfuse for Hgb<7 g/dL.    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  We will start vitamin D.  Continues on calcium supplementation.  Follow renal panel daily  Pancytopenia.  Evaluated by Dr. Earlie Server appreciate assistance.  This is limiting his ability to undergo AV fistula placement   LOS: [place] '@TODAY''@9'$ :06 AM

## 2021-02-22 NOTE — Discharge Summary (Signed)
Physician Discharge Summary  Edmon Magid NLG:[id number] DOB: 20-Dec-1960 DOA: 02/14/2021  PCP: Jolinda Croak, MD  Admit date: 02/14/2021 Discharge date: 02/22/2021 Admitted From: Home Disposition: Home Recommendations for Outpatient Follow-up:  Follow ups as below. Please obtain CBC/BMP/Mag at follow up Ambulatory referral to infectious disease for hepatitis B infection Outpatient follow-up with cardiology for A. fib and CHF Please follow up on the following pending results: [place]: Not indicated Equipment/Devices: Not indicated Discharge Condition: Stable CODE STATUS: Full code  Follow-up Information     Jolinda Croak, MD. Schedule an appointment as soon as possible for a visit in 1 week(s).   Specialty: Family Medicine Contact information: [place] [address] [phone]                Hospital Course: 60 year old M with PMH of CKD-5 not on HD and HTN presenting to ED with nausea, vomiting, abdominal pain and fatigue for 2 weeks that has gotten worse for the last 1 week, and admitted for AKI on CKD-5 with uremia, A. fib with RVR and new onset acute combined CHF.  He was started on amnio drip and admitted to ICU.  Nephrology and cardiology consulted.  Patient had [place] placed by IR on 9/17 and started dialysis on 9/18.  Vascular surgery consulted for permanent access, which was deferred due to his thrombocytopenia and bleeding at [facility] site while on anticoagulation for A. Fib.  Per nephrology and renal navigator, patient has outpatient HD spot on MWF.   In regards to his A. fib, cardiology recommended discharging her loading dose of amiodarone followed by 200 mg daily.  He is also on Coreg 25 mg twice daily.  Anticoagulation was not resumed due to thrombocytopenia and bleeding.  He will follow-up with cardiology outpatient for further care.  Ambulatory referral to ID has been placed for hepatitis B infection.  See  individual problem list below for more on hospital course.  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  HD started on 9/18.   -He will continue HD MWF -Outpatient follow-up with vascular surgery for permanent access.  Bone mineral disorder -Discharged on vitamin D, calcium and Phos per nephrology recommendation  Paroxysmal A. fib: CHA2DS2-VASc score 2 (HTN and CHF). -Discharged on loading dose amiodarone followed by maintenance -Heparin discontinued due to thrombocytopenia and bleeding from [facility]  New onset acute combined CHF: TTE with LVEF of 40 to 45%, concentric LVH. -Discharged on Coreg, BiDil -Fluid management by HD  Essential hypertension: -Cardiac meds as above.  Pancytopenia: Felt to be due to CKD/ESRD.  Evaluated by hematology, and had extensive work-up which were negative except for elevated RF.  Anti-CCP was not performed.  Received 2 units of PRBC while in-house.  He is not symptomatic. -Recheck CBC at next HD and transfuse as appropriate -Outpatient follow-up with hematology for further evaluation   Hypokalemia: Renal function pending.  Defer to nephrology.   Acute hepatitis B infection: Hepatitis B surface antigen positive but patient completely asymptomatic and LFTs normal.  No indication to treat acutely.  Will need follow-up with ID as outpatient.   Hepatitis B infection-hep B surface antigen and core antibody positive but core IgM negative. -Hep B viral quant pending -Ambulatory referral to ID ordered.   Body mass index is 23.98 kg/m.           Discharge Exam: Vitals:   02/22/21 0900 02/22/21 1000 02/22/21 1009  02/22/21 1113  BP: 136/77 (!) 139/91 (!) 139/91   Pulse: 68 66 68   Temp:    98.3 F (36.8 C)  Resp: (!) 0 13    Weight:      SpO2: 97% 97%    TempSrc:    Oral     GENERAL: No apparent distress.  Nontoxic. HEENT: MMM.  Vision and hearing grossly intact.  NECK: Supple.  No apparent JVD.  RESP: On RA.  No  IWOB.  Fair aeration bilaterally. CVS:  RRR. Heart sounds normal.  ABD/GI/GU: Bowel sounds present. Soft. Non tender.  MSK/EXT:  Moves extremities. No apparent deformity. No edema.  SKIN: no apparent skin lesion or wound NEURO: Awake and alert.  Oriented appropriately.  No apparent focal neuro deficit. PSYCH: Calm. Normal affect.   Discharge Instructions  Discharge Instructions     (HEART FAILURE PATIENTS) Call MD:  Anytime you have any of the following symptoms: 1) 3 pound weight gain in 24 hours or 5 pounds in 1 week 2) shortness of breath, with or without a dry hacking cough 3) swelling in the hands, feet or stomach 4) if you have to sleep on extra pillows at night in order to breathe.   Complete by: As directed    Ambulatory referral to Infectious Disease   Complete by: As directed    Hepatitis B   Call MD for:  difficulty breathing, headache or visual disturbances   Complete by: As directed    Call MD for:  extreme fatigue   Complete by: As directed    Call MD for:  persistant dizziness or light-headedness   Complete by: As directed    Call MD for:  persistant nausea and vomiting   Complete by: As directed    Diet - low sodium heart healthy   Complete by: As directed    With fluid restriction to less than 1200 cc a day.   Discharge instructions   Complete by: As directed    It has been a pleasure taking care of you!  You were hospitalized due to kidney failure, atrial fibrillation (irregular and fast heart rate) and heart failure.  You have been started on medications and hemodialysis.  It is very important that you take your medications as prescribed.  It is also very important that you go to your dialysis as scheduled.  Please follow-up with your primary care doctor in 1 to 2 weeks.  Our cardiologist will arrange outpatient follow-up for your atrial fibrillation and heart failure.  You also tested positive for hepatitis B infection.  This is contagious through body fluids  including blood and sexual intercourse.  We strongly recommend taking precautions.  We have sent referral to infectious disease for further evaluation and management of this.  Please review your new medication list and the directions on your medications before you take them.   Take care,   Increase activity slowly   Complete by: As directed    No wound care   Complete by: As directed       Allergies as of 02/22/2021   No Known Allergies      Medication List     STOP taking these medications    chlorthalidone 25 MG tablet Commonly known as: HYGROTON       TAKE these medications    allopurinol 100 MG tablet Commonly known as: ZYLOPRIM Take 0.5 tablets (50 mg total) by mouth daily. What changed:  how much to take when to take this reasons  to take this   amiodarone 200 MG tablet Commonly known as: PACERONE Take 2 tablets (400 mg total) by mouth daily for 7 days, THEN 1 tablet (200 mg total) daily. Start taking on: February 23, 2021   atorvastatin 40 MG tablet Commonly known as: LIPITOR Take 1 tablet (40 mg total) by mouth daily. Start taking on: February 23, 2021   calcitRIOL 0.25 MCG capsule Commonly known as: ROCALTROL Take 1 capsule (0.25 mcg total) by mouth daily. Start taking on: February 23, 2021   calcium acetate 667 MG capsule Commonly known as: PHOSLO Take 2 capsules (1,334 mg total) by mouth 3 (three) times daily with meals.   calcium carbonate 1250 (500 Ca) MG tablet Commonly known as: OS-CAL - dosed in mg of elemental calcium Take 1 tablet (500 mg of elemental calcium total) by mouth 3 (three) times daily with meals.   carvedilol 25 MG tablet Commonly known as: COREG Take 25 mg by mouth 2 (two) times daily. Notes to patient: NEXT dose: Saturday 9/24 @ 5:00PM   colchicine 0.6 MG tablet Take 1 tablet (0.6 mg total) by mouth daily. What changed:  when to take this reasons to take this   ferrous sulfate 325 (65 FE) MG tablet Take 1  tablet (325 mg total) by mouth daily.   hydrALAZINE 100 MG tablet Commonly known as: APRESOLINE Take 100 mg by mouth 2 (two) times daily. What changed: Another medication with the same name was removed. Continue taking this medication, and follow the directions you see here.   isosorbide mononitrate 30 MG 24 hr tablet Commonly known as: IMDUR Take 1 tablet (30 mg total) by mouth daily. Start taking on: February 23, 2021   senna-docusate 8.6-50 MG tablet Commonly known as: Senokot-S Take 1 tablet by mouth 2 (two) times daily between meals as needed for mild constipation.   Vitamin D3 25 MCG tablet Commonly known as: Vitamin D Take 1 tablet (1,000 Units total) by mouth daily.        Consultations: Cardiology Nephrology Interventional radiology  Procedures/Studies: 9/17-TDC   DG Chest 2 View  Result Date: 02/14/2021 CLINICAL DATA:  Shortness of breath EXAM: CHEST - 2 VIEW COMPARISON:  Chest x-ray dated November 18, 2020 FINDINGS: Inferior cardiophrenic angles are excluded from the field-of-view on AP image. Unchanged cardiomegaly and tortuosity of the thoracic aorta. Lungs are clear. No evidence of pleural effusion or pneumothorax. IMPRESSION: No active cardiopulmonary disease. Electronically Signed   By: Yetta Glassman M.D.   On: 02/14/2021 17:03   IR Fluoro Guide CV Line Right  Result Date: 02/15/2021 INDICATION: ESRD requiring HD. EXAM: TUNNELED CENTRAL VENOUS HEMODIALYSIS CATHETER PLACEMENT WITH ULTRASOUND AND FLUOROSCOPIC GUIDANCE MEDICATIONS: None. ANESTHESIA/SEDATION: Moderate (conscious) sedation was employed during this procedure. A total of Versed 1 mg and Fentanyl 50 mcg was administered intravenously. Moderate Sedation Time: 22 minutes. The patient's level of consciousness and vital signs were monitored continuously by radiology nursing throughout the procedure under my direct supervision. FLUOROSCOPY TIME:  0 minutes 18 seconds (1 mGy). COMPLICATIONS: None immediate.  PROCEDURE: Informed written consent was obtained from the patient after a discussion of the risks, benefits, and alternatives to treatment. Questions regarding the procedure were encouraged and answered. The right neck and chest were prepped with chlorhexidine in a sterile fashion, and a sterile drape was applied covering the operative field. Maximum barrier sterile technique with sterile gowns and gloves were used for the procedure. A timeout was performed prior to the initiation of the procedure. After creating  a small venotomy incision, a micropuncture kit was utilized to access the internal jugular vein. Real-time ultrasound guidance was utilized for vascular access including the acquisition of a permanent ultrasound image documenting patency of the accessed vessel. The microwire was utilized to measure appropriate catheter length. A stiff Glidewire was advanced to the level of the IVC and the micropuncture sheath was exchanged for a peel-away sheath. A tunneled hemodialysis catheter measuring 23 cm from tip to cuff was tunneled in a retrograde fashion from the anterior chest wall to the venotomy incision. The catheter was then placed through the peel-away sheath with tips ultimately positioned within the superior aspect of the right atrium. Final catheter positioning was confirmed and documented with a spot radiographic image. The catheter aspirates and flushes normally. The catheter was flushed with appropriate volume heparin dwells. The catheter exit site was secured with a 2-0 nylon retention suture. The venotomy incision was closed with Dermabond. Dressings were applied. The patient tolerated the procedure well without immediate post procedural complication. IMPRESSION: Successful placement of 23 cm tip to cuff tunneled hemodialysis catheter via the right internal jugular vein with tips terminating within the superior aspect of the right atrium. The catheter is ready for immediate use. Michaelle Birks, MD  [facility] Radiology Electronically Signed   By: Michaelle Birks M.D.   On: 02/15/2021 13:46   IR US Guide Vasc Access Right  Result Date: 02/15/2021 INDICATION: ESRD requiring HD. EXAM: TUNNELED CENTRAL VENOUS HEMODIALYSIS CATHETER PLACEMENT WITH ULTRASOUND AND FLUOROSCOPIC GUIDANCE MEDICATIONS: None. ANESTHESIA/SEDATION: Moderate (conscious) sedation was employed during this procedure. A total of Versed 1 mg and Fentanyl 50 mcg was administered intravenously. Moderate Sedation Time: 22 minutes. The patient's level of consciousness and vital signs were monitored continuously by radiology nursing throughout the procedure under my direct supervision. FLUOROSCOPY TIME:  0 minutes 18 seconds (1 mGy). COMPLICATIONS: None immediate. PROCEDURE: Informed written consent was obtained from the patient after a discussion of the risks, benefits, and alternatives to treatment. Questions regarding the procedure were encouraged and answered. The right neck and chest were prepped with chlorhexidine in a sterile fashion, and a sterile drape was applied covering the operative field. Maximum barrier sterile technique with sterile gowns and gloves were used for the procedure. A timeout was performed prior to the initiation of the procedure. After creating a small venotomy incision, a micropuncture kit was utilized to access the internal jugular vein. Real-time ultrasound guidance was utilized for vascular access including the acquisition of a permanent ultrasound image documenting patency of the accessed vessel. The microwire was utilized to measure appropriate catheter length. A stiff Glidewire was advanced to the level of the IVC and the micropuncture sheath was exchanged for a peel-away sheath. A tunneled hemodialysis catheter measuring 23 cm from tip to cuff was tunneled in a retrograde fashion from the anterior chest wall to the venotomy incision. The catheter was then placed  through the peel-away sheath with tips ultimately positioned within the superior aspect of the right atrium. Final catheter positioning was confirmed and documented with a spot radiographic image. The catheter aspirates and flushes normally. The catheter was flushed with appropriate volume heparin dwells. The catheter exit site was secured with a 2-0 nylon retention suture. The venotomy incision was closed with Dermabond. Dressings were applied. The patient tolerated the procedure well without immediate post procedural complication. IMPRESSION: Successful placement of 23 cm tip to cuff tunneled hemodialysis catheter via the right internal jugular vein with tips terminating within  the superior aspect of the right atrium. The catheter is ready for immediate use. Michaelle Birks, MD [facility] Radiology Electronically Signed   By: Michaelle Birks M.D.   On: 02/15/2021 13:46   DG Chest Port 1 View  Result Date: 02/14/2021 CLINICAL DATA:  Chest pain EXAM: PORTABLE CHEST 1 VIEW COMPARISON:  02/14/2021 FINDINGS: Mild cardiomegaly. No focal opacity or pleural effusion. Aortic atherosclerosis. No pneumothorax IMPRESSION: No active disease.  Mild cardiomegaly Electronically Signed   By: Donavan Foil M.D.   On: 02/14/2021 23:19   ECHOCARDIOGRAM COMPLETE  Result Date: 02/15/2021    ECHOCARDIOGRAM REPORT   Patient Name:   BUELL PARCEL Date of Exam: 02/15/2021 Medical Rec #:  [id number]     Height:       72.0 in Accession #:    [id number]    Weight:       176.8 lb Date of Birth:  15-Apr-1961     BSA:          2.022 m Patient Age:    60 years      BP:           141/99 mmHg Patient Gender: M             HR:           57 bpm. Exam Location:  Inpatient Procedure: 2D Echo, 3D Echo, Cardiac Doppler and Color Doppler Indications:    Atrial fibrillation  History:        Patient has no prior history of Echocardiogram examinations.                 Risk Factors:Hypertension. ESRD.   Sonographer:    Clayton Lefort RDCS (AE) Referring Phys: [id number] [place]  1. Left ventricular ejection fraction, by estimation, is 40 to 45%. The left ventricle has mildly decreased function. The left ventricle has no regional wall motion abnormalities. There is severe concentric left ventricular hypertrophy. Left ventricular  diastolic parameters are consistent with Grade II diastolic dysfunction (pseudonormalization). Elevated left ventricular end-diastolic pressure.  2. Right ventricular systolic function is normal. The right ventricular size is normal. Tricuspid regurgitation signal is inadequate for assessing PA pressure.  3. Left atrial size was severely dilated.  4. Right atrial size was mildly dilated.  5. The mitral valve is grossly normal. Mild mitral valve regurgitation.  6. The aortic valve is tricuspid. There is mild thickening of the aortic valve. Aortic valve regurgitation is trivial. No aortic stenosis is present. Aortic valve mean gradient measures 6.0 mmHg.  7. Aortic dilatation noted. There is mild dilatation of the aortic root, measuring 41 mm.  8. The inferior vena cava is normal in size with <50% respiratory variability, suggesting right atrial pressure of 8 mmHg. Comparison(s): No prior Echocardiogram. FINDINGS  Left Ventricle: Left ventricular ejection fraction, by estimation, is 40 to 45%. The left ventricle has mildly decreased function. The left ventricle has no regional wall motion abnormalities. The left ventricular internal cavity size was normal in size. There is severe concentric left ventricular hypertrophy. Left ventricular diastolic parameters are consistent with Grade II diastolic dysfunction (pseudonormalization). Elevated left ventricular end-diastolic pressure. Right Ventricle: The right ventricular size is normal. No increase in right ventricular wall thickness. Right ventricular systolic function is normal. Tricuspid regurgitation signal is inadequate for  assessing PA pressure. Left Atrium: Left atrial size was severely dilated. Right Atrium: Right atrial size was mildly dilated. Pericardium: There is no evidence of pericardial effusion. Mitral  Valve: The mitral valve is grossly normal. Mild mitral valve regurgitation. MV peak gradient, 6.6 mmHg. The mean mitral valve gradient is 2.0 mmHg. Tricuspid Valve: The tricuspid valve is grossly normal. Tricuspid valve regurgitation is trivial. Aortic Valve: The aortic valve is tricuspid. There is mild thickening of the aortic valve. There is mild aortic valve annular calcification. Aortic valve regurgitation is trivial. No aortic stenosis is present. Aortic valve mean gradient measures 6.0 mmHg. Aortic valve peak gradient measures 9.0 mmHg. Aortic valve area, by VTI measures 2.31 cm. Pulmonic Valve: The pulmonic valve was grossly normal. Pulmonic valve regurgitation is trivial. Aorta: Aortic dilatation noted. There is mild dilatation of the aortic root, measuring 41 mm. Venous: The inferior vena cava is normal in size with less than 50% respiratory variability, suggesting right atrial pressure of 8 mmHg. IAS/Shunts: No atrial level shunt detected by color flow Doppler.  LEFT VENTRICLE PLAX 2D LVIDd:         5.60 cm      Diastology LVIDs:         4.30 cm      LV e' medial:    4.95 cm/s LV PW:         2.00 cm      LV E/e' medial:  23.6 LV IVS:        2.00 cm      LV e' lateral:   10.10 cm/s LVOT diam:     2.30 cm      LV E/e' lateral: 11.6 LV SV:         81 LV SV Index:   40 LVOT Area:     4.15 cm                              3D Volume EF: LV Volumes (MOD)            3D EF:        45 % LV vol d, MOD A2C: 190.0 ml LV EDV:       232 ml LV vol d, MOD A4C: 166.0 ml LV ESV:       127 ml LV vol s, MOD A2C: 101.0 ml LV SV:        105 ml LV vol s, MOD A4C: 93.5 ml LV SV MOD A2C:     89.0 ml LV SV MOD A4C:     166.0 ml LV SV MOD BP:      88.2 ml RIGHT VENTRICLE             IVC RV Basal diam:  4.00 cm     IVC diam: 2.00 cm RV Mid  diam:    2.20 cm RV S prime:     11.90 cm/s TAPSE (M-mode): 3.0 cm LEFT ATRIUM              Index       RIGHT ATRIUM           Index LA diam:        4.30 cm  2.13 cm/m  RA Area:     22.30 cm LA Vol (A2C):   116.0 ml 57.37 ml/m RA Volume:   70.00 ml  34.62 ml/m LA Vol (A4C):   115.0 ml 56.88 ml/m LA Biplane Vol: 120.0 ml 59.35 ml/m  AORTIC VALVE AV Area (Vmax):    2.39 cm AV Area (Vmean):   2.21 cm AV Area (VTI):     2.31  cm AV Vmax:           150.00 cm/s AV Vmean:          115.000 cm/s AV VTI:            0.350 m AV Peak Grad:      9.0 mmHg AV Mean Grad:      6.0 mmHg LVOT Vmax:         86.20 cm/s LVOT Vmean:        61.100 cm/s LVOT VTI:          0.195 m LVOT/AV VTI ratio: 0.56  AORTA Ao Root diam: 4.10 cm Ao Asc diam:  3.70 cm MITRAL VALVE MV Area (PHT): 2.38 cm     SHUNTS MV Area VTI:   2.19 cm     Systemic VTI:  0.20 m MV Peak grad:  6.6 mmHg     Systemic Diam: 2.30 cm MV Mean grad:  2.0 mmHg MV Vmax:       1.28 m/s MV Vmean:      64.6 cm/s MV Decel Time: 319 msec MR Peak grad: 104.9 mmHg MR Mean grad: 68.0 mmHg MR Vmax:      512.00 cm/s MR Vmean:     387.0 cm/s MV E velocity: 117.00 cm/s MV A velocity: 58.70 cm/s MV E/A ratio:  1.99 Rozann Lesches MD Electronically signed by Rozann Lesches MD Signature Date/Time: 02/15/2021/3:01:03 PM    Final    VAS [place] UPPER EXT VEIN MAPPING (PRE-OP AVF)  Result Date: 02/16/2021 [place] MAPPING Patient Name:  EKIN PILAR  Date of Exam:   02/16/2021 Medical Rec #: [id number]      Accession #:    [id number] Date of Birth: [birth year]      Patient Gender: M Patient Age:   56 years Exam Location:  [facility] Procedure:      VAS [place] UPPER EXT VEIN MAPPING (PRE-OP AVF) Referring Phys: JOSHUA ROBINS --------------------------------------------------------------------------------  Indications: Pre-access. Comparison Study: no prior Performing Technologist: Archie Patten RVS  Examination Guidelines: A complete evaluation includes B-mode imaging,  spectral Doppler, color Doppler, and power Doppler as needed of all accessible portions of each vessel. Bilateral testing is considered an integral part of a complete examination. Limited examinations for reoccurring indications may be performed as noted. +-----------------+-------------+----------+---------+ Right Cephalic   Diameter (cm)Depth (cm)Findings  +-----------------+-------------+----------+---------+ Shoulder             0.13        0.24             +-----------------+-------------+----------+---------+ Prox upper arm       0.21        0.23             +-----------------+-------------+----------+---------+ Mid upper arm        0.22        0.35   branching +-----------------+-------------+----------+---------+ Dist upper arm       0.29        0.48             +-----------------+-------------+----------+---------+ Antecubital fossa    0.28        0.24             +-----------------+-------------+----------+---------+ Prox forearm         0.22        0.19             +-----------------+-------------+----------+---------+ Mid forearm          0.18  0.19             +-----------------+-------------+----------+---------+ Dist forearm         0.13        0.35             +-----------------+-------------+----------+---------+ Wrist                0.17        0.31             +-----------------+-------------+----------+---------+ +-----------------+-------------+----------+--------+ Right Basilic    Diameter (cm)Depth (cm)Findings +-----------------+-------------+----------+--------+ Prox upper arm       0.42        0.74            +-----------------+-------------+----------+--------+ Mid upper arm        0.32        0.72            +-----------------+-------------+----------+--------+ Dist upper arm       0.32        0.62            +-----------------+-------------+----------+--------+ Antecubital fossa    0.30        0.20             +-----------------+-------------+----------+--------+ Prox forearm         0.26        0.27            +-----------------+-------------+----------+--------+ Mid forearm          0.13        0.27            +-----------------+-------------+----------+--------+ Distal forearm       0.13        0.19            +-----------------+-------------+----------+--------+ +-----------------+-------------+----------+--------+ Left Cephalic    Diameter (cm)Depth (cm)Findings +-----------------+-------------+----------+--------+ Shoulder             0.31        0.27            +-----------------+-------------+----------+--------+ Prox upper arm       0.31        0.26            +-----------------+-------------+----------+--------+ Mid upper arm        0.24        0.26            +-----------------+-------------+----------+--------+ Dist upper arm       0.30        0.23            +-----------------+-------------+----------+--------+ Antecubital fossa    0.33        0.35            +-----------------+-------------+----------+--------+ Prox forearm         0.25        0.32            +-----------------+-------------+----------+--------+ Mid forearm          0.15        0.27            +-----------------+-------------+----------+--------+ Dist forearm         0.20        0.43            +-----------------+-------------+----------+--------+ Wrist                0.19        0.35            +-----------------+-------------+----------+--------+ +-----------------+-------------+----------+--------+ Left Basilic  Diameter (cm)Depth (cm)Findings +-----------------+-------------+----------+--------+ Prox upper arm       0.36        0.55            +-----------------+-------------+----------+--------+ Mid upper arm        0.36        0.52            +-----------------+-------------+----------+--------+ Dist upper arm       0.35        0.46             +-----------------+-------------+----------+--------+ Antecubital fossa    0.21        0.25            +-----------------+-------------+----------+--------+ Prox forearm         0.15        0.33            +-----------------+-------------+----------+--------+ Mid forearm          0.15        0.20            +-----------------+-------------+----------+--------+ Distal forearm       0.14        0.15            +-----------------+-------------+----------+--------+ *See table(s) above for measurements and observations.  Diagnosing physician: Orlie Pollen Electronically signed by Orlie Pollen on 02/16/2021 at 2:56:24 PM.    Final    US Abdomen Limited RUQ (LIVER/GB)  Result Date: 02/14/2021 CLINICAL DATA:  Epigastric pain EXAM: ULTRASOUND ABDOMEN LIMITED RIGHT UPPER QUADRANT COMPARISON:  Ultrasound 12/18/2020 FINDINGS: Gallbladder: No shadowing stones. Possible mild focal wall thickening up to 3.5 mm. Negative sonographic Murphy. Trace pericholecystic fluid Common bile duct: Diameter: 4.5 mm Liver: No focal lesion identified. Within normal limits in parenchymal echogenicity. Portal vein is patent on color Doppler imaging with normal direction of blood flow towards the liver. Other: Echogenic right kidney consistent with medical renal disease. IMPRESSION: 1. Negative for gallstones. There may be mild focal wall thickening but there is a negative sonographic Murphy. Trace fluid adjacent to gallbladder. Findings not strongly suggestive of acute gallbladder disease; if continued concern, nuclear medicine hepatobiliary imaging may be obtained. 2. Echogenic right kidney consistent with medical renal disease Electronically Signed   By: Donavan Foil M.D.   On: 02/14/2021 23:57       The results of significant diagnostics from this hospitalization (including imaging, microbiology, ancillary and laboratory) are listed below for reference.     Microbiology: Recent Results (from the past 240  hour(s))  Resp Panel by RT-PCR (Flu A&B, Covid) Nasopharyngeal Swab     Status: None   Collection Time: 02/14/21  7:17 PM   Specimen: Nasopharyngeal Swab; Nasopharyngeal(NP) swabs in vial transport medium  Result Value Ref Range Status   SARS Coronavirus 2 by RT PCR NEGATIVE NEGATIVE Final    Comment: (NOTE) SARS-CoV-2 target nucleic acids are NOT DETECTED.  The SARS-CoV-2 RNA is generally detectable in upper respiratory specimens during the acute phase of infection. The lowest concentration of SARS-CoV-2 viral copies this assay can detect is 138 copies/mL. A negative result does not preclude SARS-Cov-2 infection and should not be used as the sole basis for treatment or other patient management decisions. A negative result may occur with  improper specimen collection/handling, submission of specimen other than nasopharyngeal swab, presence of viral mutation(s) within the areas targeted by this assay, and inadequate number of viral copies(<138 copies/mL). A negative result must be combined with clinical observations, patient history, and  epidemiological information. The expected result is Negative.  Fact Sheet for Patients:  EntrepreneurPulse.com.au  Fact Sheet for Healthcare Providers:  IncredibleEmployment.be  This test is no t yet approved or cleared by the [place] FDA and  has been authorized for detection and/or diagnosis of SARS-CoV-2 by FDA under an Emergency Use Authorization (EUA). This EUA will remain  in effect (meaning this test can be used) for the duration of the COVID-19 declaration under Section 564(b)(1) of the Act, 21 U.S.C.section 360bbb-3(b)(1), unless the authorization is terminated  or revoked sooner.       Influenza A by PCR NEGATIVE NEGATIVE Final   Influenza B by PCR NEGATIVE NEGATIVE Final    Comment: (NOTE) The Xpert Xpress SARS-CoV-2/FLU/RSV plus assay is intended as an aid in the diagnosis of influenza from  Nasopharyngeal swab specimens and should not be used as a sole basis for treatment. Nasal washings and aspirates are unacceptable for Xpert Xpress SARS-CoV-2/FLU/RSV testing.  Fact Sheet for Patients: EntrepreneurPulse.com.au  Fact Sheet for Healthcare Providers: IncredibleEmployment.be  This test is not yet approved or cleared by the [place] FDA and has been authorized for detection and/or diagnosis of SARS-CoV-2 by FDA under an Emergency Use Authorization (EUA). This EUA will remain in effect (meaning this test can be used) for the duration of the COVID-19 declaration under Section 564(b)(1) of the Act, 21 U.S.C. section 360bbb-3(b)(1), unless the authorization is terminated or revoked.  Performed at [facility], [place] [address], [place], [place] [address]   MRSA Next Gen by PCR, Nasal     Status: None   Collection Time: 02/15/21 12:38 AM   Specimen: Nasal Mucosa; Nasal Swab  Result Value Ref Range Status   MRSA by PCR Next Gen NOT DETECTED NOT DETECTED Final    Comment: (NOTE) The GeneXpert MRSA Assay (FDA approved for NASAL specimens only), is one component of a comprehensive MRSA colonization surveillance program. It is not intended to diagnose MRSA infection nor to guide or monitor treatment for MRSA infections. Test performance is not FDA approved in patients less than 66 years old. Performed at [facility], [place] [address] [place], [place] [address]      Labs:  CBC: Recent Labs  Lab 02/18/21 0247 02/18/21 2100 02/19/21 0208 02/19/21 1652 02/20/21 0153 02/20/21 1829 02/21/21 0245 02/22/21 0217  WBC 3.9*  --  4.0  --  4.0  --  4.3 4.2  HGB 6.4*   < > 7.4*  --  7.0* 7.6* 7.7* 7.5*  HCT 19.6*   < > 22.2*  --  21.0* 23.4* 23.1* 22.6*  MCV 80.7  --  81.0  --  81.1  --  81.9 82.8  PLT 48*  --  45* 46* 43*  --  48* 55*   < > = values in this interval not displayed.   BMP &GFR Recent Labs  Lab  02/16/21 0154 02/17/21 0008 02/17/21 2152 02/18/21 0247 02/19/21 0208 02/20/21 0153  NA 136 134* 135 135 135 134*  K 3.0* 3.0* 3.0* 3.1* 3.2* 3.6  CL 96* 94* 99 95* 97* 97*  CO2 19* 22 23 23 23 26   GLUCOSE 139* 102* 117* 103* 121* 129*  BUN 177* 126* 92* 95* 106* 72*  CREATININE 26.48* 20.26* 16.65* 17.11* 17.68* 13.41*  CALCIUM 6.0* 6.6*  6.4* 7.4* 7.4* 7.6* 8.0*  MG 1.9 1.7 1.7  --   --   --   PHOS 9.4* 6.5*  --   --  6.5*  --  Estimated Creatinine Clearance: 6.4 mL/min (A) (by C-G formula based on SCr of 13.41 mg/dL (H)). Liver & Pancreas: Recent Labs  Lab 02/17/21 2152 02/19/21 0208 02/20/21 0153  AST 53*  --  43*  ALT 41  --  42  ALKPHOS 80  --  70  BILITOT 0.5  --  0.3  PROT 6.0*  --  5.9*  ALBUMIN 3.0* 2.9* 2.8*   No results for input(s): LIPASE, AMYLASE in the last 168 hours. No results for input(s): AMMONIA in the last 168 hours. Diabetic: No results for input(s): HGBA1C in the last 72 hours. Recent Labs  Lab 02/16/21 2019 02/16/21 2308 02/17/21 0455 02/17/21 1931 02/18/21 0045  GLUCAP 108* 129* 100* 141* 103*   Cardiac Enzymes: No results for input(s): CKTOTAL, CKMB, CKMBINDEX, TROPONINI in the last 168 hours. No results for input(s): PROBNP in the last 8760 hours. Coagulation Profile: Recent Labs  Lab 02/19/21 1652  INR 1.3*   Thyroid Function Tests: No results for input(s): TSH, T4TOTAL, FREET4, T3FREE, THYROIDAB in the last 72 hours. Lipid Profile: No results for input(s): CHOL, HDL, LDLCALC, TRIG, CHOLHDL, LDLDIRECT in the last 72 hours. Anemia Panel: Recent Labs    02/19/21 1211 02/19/21 1652  VITAMINB12 285  --   FOLATE 7.8  --   FERRITIN 347*  --   TIBC 259  --   IRON 72  --   RETICCTPCT  --  0.5   Urine analysis:    Component Value Date/Time   COLORURINE STRAW (A) 12/18/2020 [place] 12/18/2020 1605   LABSPEC 1.008 12/18/2020 1605   PHURINE 6.0 12/18/2020 1605   GLUCOSEU NEGATIVE 12/18/2020 1605    HGBUR SMALL (A) 12/18/2020 1605   BILIRUBINUR NEGATIVE 12/18/2020 1605   KETONESUR NEGATIVE 12/18/2020 1605   PROTEINUR 100 (A) 12/18/2020 1605   NITRITE NEGATIVE 12/18/2020 1605   LEUKOCYTESUR NEGATIVE 12/18/2020 1605   Sepsis Labs: Invalid input(s): PROCALCITONIN, LACTICIDVEN   Time coordinating discharge: 45 minutes  SIGNED:  Mercy Riding, MD  [facility] 02/22/2021, 11:29 AM

## 2021-02-22 NOTE — Progress Notes (Signed)
Pt discharged home with wife. All belongings sent with patient. Discharge education complete and all questions answered. VSS and assessment stable and within normal limits.

## 2021-02-23 LAB — HEPATITIS B SURFACE ANTIGEN: Hepatitis B Surface Ag: REACTIVE — AB

## 2021-02-23 NOTE — TOC Transition Note (Signed)
Transition of care contact from inpatient facility  Date of discharge: 02/22/21 Date of contact: 02/23/21 Method: Attempted Spoke to: No Answer  Attempted to contact patient  to discuss transition of care from recent inpatient hospitalization but patient did not pick up the phone. Unable to leave voicemail. Voicemail message states: "Mailbox is full".  Patient will follow up with his outpatient HD unit on: 02/24/21 at [facility].  Tobie Poet, NP

## 2021-02-24 DIAGNOSIS — E876 Hypokalemia: Secondary | ICD-10-CM | POA: Insufficient documentation

## 2021-02-24 DIAGNOSIS — T782XXA Anaphylactic shock, unspecified, initial encounter: Secondary | ICD-10-CM | POA: Insufficient documentation

## 2021-02-24 DIAGNOSIS — T7840XA Allergy, unspecified, initial encounter: Secondary | ICD-10-CM | POA: Insufficient documentation

## 2021-02-24 DIAGNOSIS — B161 Acute hepatitis B with delta-agent without hepatic coma: Secondary | ICD-10-CM | POA: Insufficient documentation

## 2021-02-24 DIAGNOSIS — Z23 Encounter for immunization: Secondary | ICD-10-CM | POA: Insufficient documentation

## 2021-02-24 DIAGNOSIS — T829XXA Unspecified complication of cardiac and vascular prosthetic device, implant and graft, initial encounter: Secondary | ICD-10-CM | POA: Insufficient documentation

## 2021-02-24 DIAGNOSIS — N2581 Secondary hyperparathyroidism of renal origin: Secondary | ICD-10-CM | POA: Insufficient documentation

## 2021-02-24 LAB — MULTIPLE MYELOMA PANEL, SERUM
Albumin SerPl Elph-Mcnc: 3.4 g/dL (ref 2.9–4.4)
Albumin/Glob SerPl: 1.3 (ref 0.7–1.7)
Alpha 1: 0.2 g/dL (ref 0.0–0.4)
Alpha2 Glob SerPl Elph-Mcnc: 0.5 g/dL (ref 0.4–1.0)
B-Globulin SerPl Elph-Mcnc: 0.7 g/dL (ref 0.7–1.3)
Gamma Glob SerPl Elph-Mcnc: 1.2 g/dL (ref 0.4–1.8)
Globulin, Total: 2.7 g/dL (ref 2.2–3.9)
IgA: 270 mg/dL (ref 90–386)
IgG (Immunoglobin G), Serum: 1218 mg/dL (ref 603–1613)
IgM (Immunoglobulin M), Srm: 41 mg/dL (ref 20–172)
Total Protein ELP: 6.1 g/dL (ref 6.0–8.5)

## 2021-02-25 ENCOUNTER — Other Ambulatory Visit: Payer: Self-pay | Admitting: Internal Medicine

## 2021-02-25 DIAGNOSIS — D696 Thrombocytopenia, unspecified: Secondary | ICD-10-CM

## 2021-02-25 LAB — HEPATITIS B DNA, ULTRAQUANTITATIVE, PCR
HBV DNA SERPL PCR-ACNC: 1670 IU/mL
HBV DNA SERPL PCR-LOG IU: 3.223 log10 IU/mL

## 2021-02-27 ENCOUNTER — Inpatient Hospital Stay: Payer: BC Managed Care – PPO | Attending: Internal Medicine | Admitting: Internal Medicine

## 2021-02-27 ENCOUNTER — Other Ambulatory Visit: Payer: Self-pay

## 2021-02-27 ENCOUNTER — Telehealth: Payer: Self-pay | Admitting: Medical Oncology

## 2021-02-27 ENCOUNTER — Inpatient Hospital Stay: Payer: BC Managed Care – PPO

## 2021-02-27 VITALS — BP 179/99 | HR 63 | Temp 98.4°F | Resp 18 | Ht 72.0 in | Wt 179.1 lb

## 2021-02-27 DIAGNOSIS — D696 Thrombocytopenia, unspecified: Secondary | ICD-10-CM

## 2021-02-27 DIAGNOSIS — D61818 Other pancytopenia: Secondary | ICD-10-CM | POA: Diagnosis not present

## 2021-02-27 DIAGNOSIS — I082 Rheumatic disorders of both aortic and tricuspid valves: Secondary | ICD-10-CM | POA: Diagnosis not present

## 2021-02-27 DIAGNOSIS — I132 Hypertensive heart and chronic kidney disease with heart failure and with stage 5 chronic kidney disease, or end stage renal disease: Secondary | ICD-10-CM

## 2021-02-27 DIAGNOSIS — D631 Anemia in chronic kidney disease: Secondary | ICD-10-CM | POA: Diagnosis not present

## 2021-02-27 DIAGNOSIS — N186 End stage renal disease: Secondary | ICD-10-CM | POA: Diagnosis not present

## 2021-02-27 DIAGNOSIS — B1910 Unspecified viral hepatitis B without hepatic coma: Secondary | ICD-10-CM | POA: Diagnosis not present

## 2021-02-27 DIAGNOSIS — Z79899 Other long term (current) drug therapy: Secondary | ICD-10-CM | POA: Insufficient documentation

## 2021-02-27 DIAGNOSIS — D638 Anemia in other chronic diseases classified elsewhere: Secondary | ICD-10-CM

## 2021-02-27 DIAGNOSIS — I4891 Unspecified atrial fibrillation: Secondary | ICD-10-CM | POA: Diagnosis not present

## 2021-02-27 DIAGNOSIS — I1311 Hypertensive heart and chronic kidney disease without heart failure, with stage 5 chronic kidney disease, or end stage renal disease: Secondary | ICD-10-CM | POA: Diagnosis not present

## 2021-02-27 LAB — CMP (CANCER CENTER ONLY)
ALT: 24 U/L (ref 0–44)
AST: 21 U/L (ref 15–41)
Albumin: 3.8 g/dL (ref 3.5–5.0)
Alkaline Phosphatase: 95 U/L (ref 38–126)
Anion gap: 12 (ref 5–15)
BUN: 35 mg/dL — ABNORMAL HIGH (ref 6–20)
CO2: 29 mmol/L (ref 22–32)
Calcium: 9.4 mg/dL (ref 8.9–10.3)
Chloride: 99 mmol/L (ref 98–111)
Creatinine: 8.65 mg/dL (ref 0.61–1.24)
GFR, Estimated: 6 mL/min — ABNORMAL LOW (ref >60)
Glucose, Bld: 94 mg/dL (ref 70–99)
Potassium: 4.6 mmol/L (ref 3.5–5.1)
Sodium: 140 mmol/L (ref 135–145)
Total Bilirubin: 0.7 mg/dL (ref 0.3–1.2)
Total Protein: 7.3 g/dL (ref 6.5–8.1)

## 2021-02-27 LAB — CBC WITH DIFFERENTIAL (CANCER CENTER ONLY)
Abs Immature Granulocytes: 0.01 10*3/uL (ref 0.00–0.07)
Basophils Absolute: 0 10*3/uL (ref 0.0–0.1)
Basophils Relative: 1 %
Eosinophils Absolute: 0.1 10*3/uL (ref 0.0–0.5)
Eosinophils Relative: 2 %
HCT: 24 % — ABNORMAL LOW (ref 39.0–52.0)
Hemoglobin: 7.9 g/dL — ABNORMAL LOW (ref 13.0–17.0)
Immature Granulocytes: 0 %
Lymphocytes Relative: 32 %
Lymphs Abs: 1.2 10*3/uL (ref 0.7–4.0)
MCH: 27.7 pg (ref 26.0–34.0)
MCHC: 32.9 g/dL (ref 30.0–36.0)
MCV: 84.2 fL (ref 80.0–100.0)
Monocytes Absolute: 0.5 10*3/uL (ref 0.1–1.0)
Monocytes Relative: 13 %
Neutro Abs: 2 10*3/uL (ref 1.7–7.7)
Neutrophils Relative %: 52 %
Platelet Count: 82 10*3/uL — ABNORMAL LOW (ref 150–400)
RBC: 2.85 MIL/uL — ABNORMAL LOW (ref 4.22–5.81)
RDW: 15.7 % — ABNORMAL HIGH (ref 11.5–15.5)
WBC Count: 3.9 10*3/uL — ABNORMAL LOW (ref 4.0–10.5)
nRBC: 0 % (ref 0.0–0.2)

## 2021-02-27 LAB — LACTATE DEHYDROGENASE: LDH: 238 U/L — ABNORMAL HIGH (ref 98–192)

## 2021-02-27 NOTE — Progress Notes (Signed)
[place] [phone]) [phone]   [phone]) [phone]  OFFICE PROGRESS NOTE  Jolinda Croak, MD [place] [address]  DIAGNOSIS: Anemia and thrombocytopenia likely secondary to end-stage renal disease.  PRIOR THERAPY: None  CURRENT THERAPY: None  INTERVAL HISTORY: Marqual Mi 60 y.o. male returns to the clinic today for hospital follow-up visit accompanied by his wife.  The patient is feeling fine today with no concerning complaints.  He is currently undergoing hemodialysis on Monday, Wednesday and Friday under the care of Dr. Moshe Cipro.  He was seen during his hospitalization for evaluation of pancytopenia.  This was felt to be related to his end-stage renal disease as well as hepatitis B.  The patient has no current chest pain, shortness of breath, cough or hemoptysis.  He denied having any nausea, vomiting, diarrhea or constipation.  He has no bleeding, bruises or ecchymosis.  He is here today for evaluation and repeat blood work.  MEDICAL HISTORY: Past Medical History:  Diagnosis Date   Hypertension    Renal disorder     ALLERGIES:  has No Known Allergies.  MEDICATIONS:  Current Outpatient Medications  Medication Sig Dispense Refill   allopurinol (ZYLOPRIM) 100 MG tablet Take 0.5 tablets (50 mg total) by mouth daily. 30 tablet 1   amiodarone (PACERONE) 200 MG tablet Take 2 tablets (400 mg total) by mouth daily for 7 days, THEN 1 tablet (200 mg total) daily. 97 tablet 0   atorvastatin (LIPITOR) 40 MG tablet Take 1 tablet (40 mg total) by mouth daily. 90 tablet 1   calcitRIOL (ROCALTROL) 0.25 MCG capsule Take 1 capsule (0.25 mcg total) by mouth daily. 90 capsule 1   calcium acetate (PHOSLO) 667 MG capsule Take 2 capsules (1,334 mg total) by mouth 3 (three) times daily with meals. 270 capsule 1   calcium carbonate (OS-CAL - DOSED IN MG OF ELEMENTAL CALCIUM) 1250 (500 Ca) MG tablet Take 1 tablet (500 mg of elemental  calcium total) by mouth 3 (three) times daily with meals. 270 tablet 1   carvedilol (COREG) 25 MG tablet Take 25 mg by mouth 2 (two) times daily.     colchicine 0.6 MG tablet Take 1 tablet (0.6 mg total) by mouth daily. 30 tablet 1   ferrous sulfate 325 (65 FE) MG tablet Take 1 tablet (325 mg total) by mouth daily. 90 tablet 3   hydrALAZINE (APRESOLINE) 100 MG tablet Take 100 mg by mouth 2 (two) times daily.     isosorbide mononitrate (IMDUR) 30 MG 24 hr tablet Take 1 tablet (30 mg total) by mouth daily. 90 tablet 1   senna-docusate (SENOKOT-S) 8.6-50 MG tablet Take 1 tablet by mouth 2 (two) times daily between meals as needed for mild constipation. 180 tablet 0   Vitamin D3 (VITAMIN D) 25 MCG tablet Take 1 tablet (1,000 Units total) by mouth daily. 30 tablet 0   No current facility-administered medications for this visit.    SURGICAL HISTORY:  Past Surgical History:  Procedure Laterality Date   IR FLUORO GUIDE CV LINE RIGHT  02/15/2021   IR US GUIDE VASC ACCESS RIGHT  02/15/2021    REVIEW OF SYSTEMS:  A comprehensive review of systems was negative.   PHYSICAL EXAMINATION: General appearance: alert, cooperative, and no distress Head: Normocephalic, without obvious abnormality, atraumatic Neck: no adenopathy, no JVD, supple, symmetrical, trachea midline, and thyroid not enlarged, symmetric, no tenderness/mass/nodules Lymph nodes: Cervical, supraclavicular, and axillary nodes normal.  Resp: clear to auscultation bilaterally Back: symmetric, no curvature. ROM normal. No CVA tenderness. Cardio: regular rate and rhythm, S1, S2 normal, no murmur, click, rub or gallop GI: soft, non-tender; bowel sounds normal; no masses,  no organomegaly Extremities: extremities normal, atraumatic, no cyanosis or edema  ECOG PERFORMANCE STATUS: 1 - Symptomatic but completely ambulatory  Blood pressure (!) 179/99, pulse 63, temperature 98.4 F (36.9 C), temperature source Oral, resp. rate 18, height 6' (1.829  m), weight 179 lb 1.6 oz (81.2 kg), SpO2 100 %.  LABORATORY DATA: Lab Results  Component Value Date   WBC 4.2 02/22/2021   HGB 7.5 (L) 02/22/2021   HCT 22.6 (L) 02/22/2021   MCV 82.8 02/22/2021   PLT 55 (L) 02/22/2021      Chemistry      Component Value Date/Time   NA 134 (L) 02/20/2021 0153   K 3.6 02/20/2021 0153   CL 97 (L) 02/20/2021 0153   CO2 26 02/20/2021 0153   BUN 72 (H) 02/20/2021 0153   CREATININE 13.41 (H) 02/20/2021 0153      Component Value Date/Time   CALCIUM 8.0 (L) 02/20/2021 0153   CALCIUM 6.4 (LL) 02/17/2021 0008   ALKPHOS 70 02/20/2021 0153   AST 43 (H) 02/20/2021 0153   ALT 42 02/20/2021 0153   BILITOT 0.3 02/20/2021 0153       RADIOGRAPHIC STUDIES: DG Chest 2 View  Result Date: 02/14/2021 CLINICAL DATA:  Shortness of breath EXAM: CHEST - 2 VIEW COMPARISON:  Chest x-ray dated November 18, 2020 FINDINGS: Inferior cardiophrenic angles are excluded from the field-of-view on AP image. Unchanged cardiomegaly and tortuosity of the thoracic aorta. Lungs are clear. No evidence of pleural effusion or pneumothorax. IMPRESSION: No active cardiopulmonary disease. Electronically Signed   By: Yetta Glassman M.D.   On: 02/14/2021 17:03   IR Fluoro Guide CV Line Right  Result Date: 02/15/2021 INDICATION: ESRD requiring HD. EXAM: TUNNELED CENTRAL VENOUS HEMODIALYSIS CATHETER PLACEMENT WITH ULTRASOUND AND FLUOROSCOPIC GUIDANCE MEDICATIONS: None. ANESTHESIA/SEDATION: Moderate (conscious) sedation was employed during this procedure. A total of Versed 1 mg and Fentanyl 50 mcg was administered intravenously. Moderate Sedation Time: 22 minutes. The patient's level of consciousness and vital signs were monitored continuously by radiology nursing throughout the procedure under my direct supervision. FLUOROSCOPY TIME:  0 minutes 18 seconds (1 mGy). COMPLICATIONS: None immediate. PROCEDURE: Informed written consent was obtained from the patient after a discussion of the risks,  benefits, and alternatives to treatment. Questions regarding the procedure were encouraged and answered. The right neck and chest were prepped with chlorhexidine in a sterile fashion, and a sterile drape was applied covering the operative field. Maximum barrier sterile technique with sterile gowns and gloves were used for the procedure. A timeout was performed prior to the initiation of the procedure. After creating a small venotomy incision, a micropuncture kit was utilized to access the internal jugular vein. Real-time ultrasound guidance was utilized for vascular access including the acquisition of a permanent ultrasound image documenting patency of the accessed vessel. The microwire was utilized to measure appropriate catheter length. A stiff Glidewire was advanced to the level of the IVC and the micropuncture sheath was exchanged for a peel-away sheath. A tunneled hemodialysis catheter measuring 23 cm from tip to cuff was tunneled in a retrograde fashion from the anterior chest wall to the venotomy incision. The catheter was then placed through the peel-away sheath with tips ultimately positioned within the superior aspect of the right atrium. Final catheter positioning was confirmed  and documented with a spot radiographic image. The catheter aspirates and flushes normally. The catheter was flushed with appropriate volume heparin dwells. The catheter exit site was secured with a 2-0 nylon retention suture. The venotomy incision was closed with Dermabond. Dressings were applied. The patient tolerated the procedure well without immediate post procedural complication. IMPRESSION: Successful placement of 23 cm tip to cuff tunneled hemodialysis catheter via the right internal jugular vein with tips terminating within the superior aspect of the right atrium. The catheter is ready for immediate use. Michaelle Birks, MD [facility] Electronically Signed   By: Michaelle Birks M.D.   On: 02/15/2021 13:46   IR US Guide Vasc Access Right  Result Date: 02/15/2021 INDICATION: ESRD requiring HD. EXAM: TUNNELED CENTRAL VENOUS HEMODIALYSIS CATHETER PLACEMENT WITH ULTRASOUND AND FLUOROSCOPIC GUIDANCE MEDICATIONS: None. ANESTHESIA/SEDATION: Moderate (conscious) sedation was employed during this procedure. A total of Versed 1 mg and Fentanyl 50 mcg was administered intravenously. Moderate Sedation Time: 22 minutes. The patient's level of consciousness and vital signs were monitored continuously by radiology nursing throughout the procedure under my direct supervision. FLUOROSCOPY TIME:  0 minutes 18 seconds (1 mGy). COMPLICATIONS: None immediate. PROCEDURE: Informed written consent was obtained from the patient after a discussion of the risks, benefits, and alternatives to treatment. Questions regarding the procedure were encouraged and answered. The right neck and chest were prepped with chlorhexidine in a sterile fashion, and a sterile drape was applied covering the operative field. Maximum barrier sterile technique with sterile gowns and gloves were used for the procedure. A timeout was performed prior to the initiation of the procedure. After creating a small venotomy incision, a micropuncture kit was utilized to access the internal jugular vein. Real-time ultrasound guidance was utilized for vascular access including the acquisition of a permanent ultrasound image documenting patency of the accessed vessel. The microwire was utilized to measure appropriate catheter length. A stiff Glidewire was advanced to the level of the IVC and the micropuncture sheath was exchanged for a peel-away sheath. A tunneled hemodialysis catheter measuring 23 cm from tip to cuff was tunneled in a retrograde fashion from the anterior chest wall to the venotomy incision. The catheter was then placed through the peel-away sheath with tips ultimately positioned within the superior aspect of the right  atrium. Final catheter positioning was confirmed and documented with a spot radiographic image. The catheter aspirates and flushes normally. The catheter was flushed with appropriate volume heparin dwells. The catheter exit site was secured with a 2-0 nylon retention suture. The venotomy incision was closed with Dermabond. Dressings were applied. The patient tolerated the procedure well without immediate post procedural complication. IMPRESSION: Successful placement of 23 cm tip to cuff tunneled hemodialysis catheter via the right internal jugular vein with tips terminating within the superior aspect of the right atrium. The catheter is ready for immediate use. Michaelle Birks, MD [facility] Radiology Electronically Signed   By: Michaelle Birks M.D.   On: 02/15/2021 13:46   DG Chest Port 1 View  Result Date: 02/14/2021 CLINICAL DATA:  Chest pain EXAM: PORTABLE CHEST 1 VIEW COMPARISON:  02/14/2021 FINDINGS: Mild cardiomegaly. No focal opacity or pleural effusion. Aortic atherosclerosis. No pneumothorax IMPRESSION: No active disease.  Mild cardiomegaly Electronically Signed   By: Donavan Foil M.D.   On: 02/14/2021 23:19   ECHOCARDIOGRAM COMPLETE  Result Date: 02/15/2021    ECHOCARDIOGRAM REPORT   Patient Name:   Chistian Sida Date of  Exam: 02/15/2021 Medical Rec #:  [id number]     Height:       72.0 in Accession #:    [id number]    Weight:       176.8 lb Date of Birth:  December 11, 1960     BSA:          2.022 m Patient Age:    82 years      BP:           141/99 mmHg Patient Gender: M             HR:           57 bpm. Exam Location:  Inpatient Procedure: 2D Echo, 3D Echo, Cardiac Doppler and Color Doppler Indications:    Atrial fibrillation  History:        Patient has no prior history of Echocardiogram examinations.                 Risk Factors:Hypertension. ESRD.  Sonographer:    Clayton Lefort RDCS (AE) Referring Phys: [id number] [place]  1. Left ventricular  ejection fraction, by estimation, is 40 to 45%. The left ventricle has mildly decreased function. The left ventricle has no regional wall motion abnormalities. There is severe concentric left ventricular hypertrophy. Left ventricular  diastolic parameters are consistent with Grade II diastolic dysfunction (pseudonormalization). Elevated left ventricular end-diastolic pressure.  2. Right ventricular systolic function is normal. The right ventricular size is normal. Tricuspid regurgitation signal is inadequate for assessing PA pressure.  3. Left atrial size was severely dilated.  4. Right atrial size was mildly dilated.  5. The mitral valve is grossly normal. Mild mitral valve regurgitation.  6. The aortic valve is tricuspid. There is mild thickening of the aortic valve. Aortic valve regurgitation is trivial. No aortic stenosis is present. Aortic valve mean gradient measures 6.0 mmHg.  7. Aortic dilatation noted. There is mild dilatation of the aortic root, measuring 41 mm.  8. The inferior vena cava is normal in size with <50% respiratory variability, suggesting right atrial pressure of 8 mmHg. Comparison(s): No prior Echocardiogram. FINDINGS  Left Ventricle: Left ventricular ejection fraction, by estimation, is 40 to 45%. The left ventricle has mildly decreased function. The left ventricle has no regional wall motion abnormalities. The left ventricular internal cavity size was normal in size. There is severe concentric left ventricular hypertrophy. Left ventricular diastolic parameters are consistent with Grade II diastolic dysfunction (pseudonormalization). Elevated left ventricular end-diastolic pressure. Right Ventricle: The right ventricular size is normal. No increase in right ventricular wall thickness. Right ventricular systolic function is normal. Tricuspid regurgitation signal is inadequate for assessing PA pressure. Left Atrium: Left atrial size was severely dilated. Right Atrium: Right atrial size was  mildly dilated. Pericardium: There is no evidence of pericardial effusion. Mitral Valve: The mitral valve is grossly normal. Mild mitral valve regurgitation. MV peak gradient, 6.6 mmHg. The mean mitral valve gradient is 2.0 mmHg. Tricuspid Valve: The tricuspid valve is grossly normal. Tricuspid valve regurgitation is trivial. Aortic Valve: The aortic valve is tricuspid. There is mild thickening of the aortic valve. There is mild aortic valve annular calcification. Aortic valve regurgitation is trivial. No aortic stenosis is present. Aortic valve mean gradient measures 6.0 mmHg. Aortic valve peak gradient measures 9.0 mmHg. Aortic valve area, by VTI measures 2.31 cm. Pulmonic Valve: The pulmonic valve was grossly normal. Pulmonic valve regurgitation is trivial. Aorta: Aortic dilatation noted. There is mild dilatation of the aortic root, measuring 41 mm.  Venous: The inferior vena cava is normal in size with less than 50% respiratory variability, suggesting right atrial pressure of 8 mmHg. IAS/Shunts: No atrial level shunt detected by color flow Doppler.  LEFT VENTRICLE PLAX 2D LVIDd:         5.60 cm      Diastology LVIDs:         4.30 cm      LV e' medial:    4.95 cm/s LV PW:         2.00 cm      LV E/e' medial:  23.6 LV IVS:        2.00 cm      LV e' lateral:   10.10 cm/s LVOT diam:     2.30 cm      LV E/e' lateral: 11.6 LV SV:         81 LV SV Index:   40 LVOT Area:     4.15 cm                              3D Volume EF: LV Volumes (MOD)            3D EF:        45 % LV vol d, MOD A2C: 190.0 ml LV EDV:       232 ml LV vol d, MOD A4C: 166.0 ml LV ESV:       127 ml LV vol s, MOD A2C: 101.0 ml LV SV:        105 ml LV vol s, MOD A4C: 93.5 ml LV SV MOD A2C:     89.0 ml LV SV MOD A4C:     166.0 ml LV SV MOD BP:      88.2 ml RIGHT VENTRICLE             IVC RV Basal diam:  4.00 cm     IVC diam: 2.00 cm RV Mid diam:    2.20 cm RV S prime:     11.90 cm/s TAPSE (M-mode): 3.0 cm LEFT ATRIUM              Index       RIGHT  ATRIUM           Index LA diam:        4.30 cm  2.13 cm/m  RA Area:     22.30 cm LA Vol (A2C):   116.0 ml 57.37 ml/m RA Volume:   70.00 ml  34.62 ml/m LA Vol (A4C):   115.0 ml 56.88 ml/m LA Biplane Vol: 120.0 ml 59.35 ml/m  AORTIC VALVE AV Area (Vmax):    2.39 cm AV Area (Vmean):   2.21 cm AV Area (VTI):     2.31 cm AV Vmax:           150.00 cm/s AV Vmean:          115.000 cm/s AV VTI:            0.350 m AV Peak Grad:      9.0 mmHg AV Mean Grad:      6.0 mmHg LVOT Vmax:         86.20 cm/s LVOT Vmean:        61.100 cm/s LVOT VTI:          0.195 m LVOT/AV VTI ratio: 0.56  AORTA Ao Root diam: 4.10 cm Ao Asc diam:  3.70 cm MITRAL VALVE MV  Area (PHT): 2.38 cm     SHUNTS MV Area VTI:   2.19 cm     Systemic VTI:  0.20 m MV Peak grad:  6.6 mmHg     Systemic Diam: 2.30 cm MV Mean grad:  2.0 mmHg MV Vmax:       1.28 m/s MV Vmean:      64.6 cm/s MV Decel Time: 319 msec MR Peak grad: 104.9 mmHg MR Mean grad: 68.0 mmHg MR Vmax:      512.00 cm/s MR Vmean:     387.0 cm/s MV E velocity: 117.00 cm/s MV A velocity: 58.70 cm/s MV E/A ratio:  1.99 Rozann Lesches MD Electronically signed by Rozann Lesches MD Signature Date/Time: 02/15/2021/3:01:03 PM    Final    VAS [place] UPPER EXT VEIN MAPPING (PRE-OP AVF)  Result Date: 02/16/2021 UPPER EXTREMITY VEIN MAPPING Patient Name:  JODEN BONSALL  Date of Exam:   02/16/2021 Medical Rec #: [id number]      Accession #:    [id number] Date of Birth: 1960-11-29      Patient Gender: M Patient Age:   21 years Exam Location:  [facility] Procedure:      VAS [place] UPPER EXT VEIN MAPPING (PRE-OP AVF) Referring Phys: JOSHUA ROBINS --------------------------------------------------------------------------------  Indications: Pre-access. Comparison Study: no prior Performing Technologist: Archie Patten RVS  Examination Guidelines: A complete evaluation includes B-mode imaging, spectral Doppler, color Doppler, and power Doppler as needed of all accessible portions of each vessel. Bilateral  testing is considered an integral part of a complete examination. Limited examinations for reoccurring indications may be performed as noted. +-----------------+-------------+----------+---------+ Right Cephalic   Diameter (cm)Depth (cm)Findings  +-----------------+-------------+----------+---------+ Shoulder             0.13        0.24             +-----------------+-------------+----------+---------+ Prox upper arm       0.21        0.23             +-----------------+-------------+----------+---------+ Mid upper arm        0.22        0.35   branching +-----------------+-------------+----------+---------+ Dist upper arm       0.29        0.48             +-----------------+-------------+----------+---------+ Antecubital fossa    0.28        0.24             +-----------------+-------------+----------+---------+ Prox forearm         0.22        0.19             +-----------------+-------------+----------+---------+ Mid forearm          0.18        0.19             +-----------------+-------------+----------+---------+ Dist forearm         0.13        0.35             +-----------------+-------------+----------+---------+ Wrist                0.17        0.31             +-----------------+-------------+----------+---------+ +-----------------+-------------+----------+--------+ Right Basilic    Diameter (cm)Depth (cm)Findings +-----------------+-------------+----------+--------+ Prox upper arm       0.42        0.74            +-----------------+-------------+----------+--------+  Mid upper arm        0.32        0.72            +-----------------+-------------+----------+--------+ Dist upper arm       0.32        0.62            +-----------------+-------------+----------+--------+ Antecubital fossa    0.30        0.20            +-----------------+-------------+----------+--------+ Prox forearm         0.26        0.27             +-----------------+-------------+----------+--------+ Mid forearm          0.13        0.27            +-----------------+-------------+----------+--------+ Distal forearm       0.13        0.19            +-----------------+-------------+----------+--------+ +-----------------+-------------+----------+--------+ Left Cephalic    Diameter (cm)Depth (cm)Findings +-----------------+-------------+----------+--------+ Shoulder             0.31        0.27            +-----------------+-------------+----------+--------+ Prox upper arm       0.31        0.26            +-----------------+-------------+----------+--------+ Mid upper arm        0.24        0.26            +-----------------+-------------+----------+--------+ Dist upper arm       0.30        0.23            +-----------------+-------------+----------+--------+ Antecubital fossa    0.33        0.35            +-----------------+-------------+----------+--------+ Prox forearm         0.25        0.32            +-----------------+-------------+----------+--------+ Mid forearm          0.15        0.27            +-----------------+-------------+----------+--------+ Dist forearm         0.20        0.43            +-----------------+-------------+----------+--------+ Wrist                0.19        0.35            +-----------------+-------------+----------+--------+ +-----------------+-------------+----------+--------+ Left Basilic     Diameter (cm)Depth (cm)Findings +-----------------+-------------+----------+--------+ Prox upper arm       0.36        0.55            +-----------------+-------------+----------+--------+ Mid upper arm        0.36        0.52            +-----------------+-------------+----------+--------+ Dist upper arm       0.35        0.46            +-----------------+-------------+----------+--------+ Antecubital fossa    0.21        0.25             +-----------------+-------------+----------+--------+  Prox forearm         0.15        0.33            +-----------------+-------------+----------+--------+ Mid forearm          0.15        0.20            +-----------------+-------------+----------+--------+ Distal forearm       0.14        0.15            +-----------------+-------------+----------+--------+ *See table(s) above for measurements and observations.  Diagnosing physician: Orlie Pollen Electronically signed by Orlie Pollen on 02/16/2021 at 2:56:24 PM.    Final    US Abdomen Limited RUQ (LIVER/GB)  Result Date: 02/14/2021 CLINICAL DATA:  Epigastric pain EXAM: ULTRASOUND ABDOMEN LIMITED RIGHT UPPER QUADRANT COMPARISON:  Ultrasound 12/18/2020 FINDINGS: Gallbladder: No shadowing stones. Possible mild focal wall thickening up to 3.5 mm. Negative sonographic Murphy. Trace pericholecystic fluid Common bile duct: Diameter: 4.5 mm Liver: No focal lesion identified. Within normal limits in parenchymal echogenicity. Portal vein is patent on color Doppler imaging with normal direction of blood flow towards the liver. Other: Echogenic right kidney consistent with medical renal disease. IMPRESSION: 1. Negative for gallstones. There may be mild focal wall thickening but there is a negative sonographic Murphy. Trace fluid adjacent to gallbladder. Findings not strongly suggestive of acute gallbladder disease; if continued concern, nuclear medicine hepatobiliary imaging may be obtained. 2. Echogenic right kidney consistent with medical renal disease Electronically Signed   By: Donavan Foil M.D.   On: 02/14/2021 23:57    ASSESSMENT AND PLAN: This is a very pleasant 60 years old African male with pancytopenia presented mainly with significant anemia of chronic kidney disease as well as thrombocytopenia. Repeat CBC today showed mild leukocytopenia but there was improvement of his anemia and thrombocytopenia. I think these are related to his chronic  kidney disease as well as the hepatitis B.  I do not see any urgent need to proceed with a bone marrow biopsy and aspirate at this point. I recommended for the patient to continue his routine follow-up visit and evaluation by his nephrologist.  He may need iron infusion as well as treatment with erythrocyte stimulating factor during his dialysis. For the hypertension the patient was advised to take his blood pressure medication as prescribed and to monitor it closely at home. I will see him back for follow-up visit in 3 months for evaluation and repeat blood work and if no concerning findings at that time I will release him to follow-up with his primary care physician and nephrology. The patient was advised to call immediately if he has any other concerning symptoms in the interval. The patient voices understanding of current disease status and treatment options and is in agreement with the current care plan.  All questions were answered. The patient knows to call the clinic with any problems, questions or concerns. We can certainly see the patient much sooner if necessary.  The total time spent in the appointment was 30 minutes.  Disclaimer: This note was dictated with voice recognition software. Similar sounding words can inadvertently be transcribed and may not be corrected upon review.

## 2021-02-27 NOTE — Telephone Encounter (Signed)
CRITICAL VALUE STICKER  CRITICAL VALUE: creatinine 8.65   RECEIVER (on-site recipient of call): Abelina Bachelor  DATE & TIME NOTIFIED: 02/27/21 @ 1018  MESSENGER (representative from lab):  MD NOTIFIED: Curt Bears. MD  TIME OF NOTIFICATION: 1030  RESPONSE:  pt started Hemodialysis on 02/16/21 M-W-F.

## 2021-03-01 DIAGNOSIS — D509 Iron deficiency anemia, unspecified: Secondary | ICD-10-CM | POA: Insufficient documentation

## 2021-03-05 DIAGNOSIS — D689 Coagulation defect, unspecified: Secondary | ICD-10-CM | POA: Insufficient documentation

## 2021-03-13 ENCOUNTER — Ambulatory Visit (INDEPENDENT_AMBULATORY_CARE_PROVIDER_SITE_OTHER): Payer: BC Managed Care – PPO | Admitting: Family

## 2021-03-13 ENCOUNTER — Encounter: Payer: Self-pay | Admitting: Family

## 2021-03-13 ENCOUNTER — Telehealth: Payer: Self-pay

## 2021-03-13 ENCOUNTER — Other Ambulatory Visit (HOSPITAL_COMMUNITY): Payer: Self-pay

## 2021-03-13 ENCOUNTER — Other Ambulatory Visit: Payer: Self-pay

## 2021-03-13 VITALS — BP 152/85 | HR 67 | Temp 97.9°F | Wt 166.6 lb

## 2021-03-13 DIAGNOSIS — B181 Chronic viral hepatitis B without delta-agent: Secondary | ICD-10-CM

## 2021-03-13 NOTE — Progress Notes (Signed)
Cardiology Office Note:    Date:  03/14/2021   ID:  Antonio Keller, DOB 28-Jan-1961, MRN [id number]  PCP:  Jolinda Croak, MD  Cardiologist:  None  Electrophysiologist:  None   Referring MD: Jolinda Croak, MD   Chief Complaint  Patient presents with   Follow-up    Post hospital.   Headache     History of Present Illness:    Antonio Keller is a 60 y.o. male with a hx of ESRD, HTN who presents for follow-up.  He was last seen in the office on 08/23/2020.  He was ESRD but had not started on dialysis.  Had been referred for chest pain evaluation.  Lexiscan Myoview was ordered but he did not follow-up.  He was admitted in 01/2021 to [facility] with progression of his kidney disease requiring starting hemodialysis.  He was found to have atrial fibrillation with RVR and new systolic heart failure.  He was started on amiodarone and converted to sinus rhythm.  Plan was for amnio 400 mg x 7 days, then 200 mg x 21 days.  Anticoagulation was attempted but he became anemic and thrombocytopenic, and was discontinued.  Echocardiogram showed EF 40 to 45%, along with severe concentric LVH.  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  Echocardiogram 02/15/2021 showed EF 40 to 45%, severe concentric LVH, grade 2 diastolic dysfunction, normal RV function, severe left atrial dilatation, mild right atrial dilatation, mild MR, mild AI, mild aortic root dilatation measuring 41 mm.  Since discharge from hospital, he reports that he has been doing well.  Denies any chest pain, dyspnea, lower extremity edema, or palpitations.  Reports had near syncopal episode in the store, was diaphoretic and felt lightheaded.  No loss of consciousness.    Past Medical History:  Diagnosis Date   Hypertension    Renal disorder     Current Medications: Current Meds  Medication Sig   allopurinol (ZYLOPRIM) 100 MG tablet Take 0.5 tablets (50 mg total) by mouth daily.   amiodarone (PACERONE) 200 MG tablet Take 2 tablets (400 mg  total) by mouth daily for 7 days, THEN 1 tablet (200 mg total) daily.   atorvastatin (LIPITOR) 40 MG tablet Take 1 tablet (40 mg total) by mouth daily.   calcitRIOL (ROCALTROL) 0.25 MCG capsule Take 1 capsule (0.25 mcg total) by mouth daily.   calcium acetate (PHOSLO) 667 MG capsule Take 2 capsules (1,334 mg total) by mouth 3 (three) times daily with meals.   calcium carbonate (OS-CAL - DOSED IN MG OF ELEMENTAL CALCIUM) 1250 (500 Ca) MG tablet Take 1 tablet (500 mg of elemental calcium total) by mouth 3 (three) times daily with meals.   carvedilol (COREG) 25 MG tablet Take 25 mg by mouth 2 (two) times daily.   colchicine 0.6 MG tablet Take 1 tablet (0.6 mg total) by mouth daily.   ferrous sulfate 325 (65 FE) MG tablet Take 1 tablet (325 mg total) by mouth daily.   hydrALAZINE (APRESOLINE) 100 MG tablet Take 100 mg by mouth 2 (two) times daily.   isosorbide mononitrate (IMDUR) 30 MG 24 hr tablet Take 1 tablet (30 mg total) by mouth daily.   senna-docusate (SENOKOT-S) 8.6-50 MG tablet Take 1 tablet by mouth 2 (two) times daily between meals as needed for mild constipation.   Vitamin D3 (VITAMIN D) 25 MCG tablet Take 1 tablet (1,000 Units total) by mouth daily.     Allergies:   Patient has no known allergies.   Social History   Socioeconomic History  Marital status: Married    Spouse name: Not on file   Number of children: Not on file   Years of education: Not on file   Highest education level: Not on file  Occupational History   Not on file  Tobacco Use   Smoking status: Never   Smokeless tobacco: Never  Substance and Sexual Activity   Alcohol use: Not Currently   Drug use: Never   Sexual activity: Yes    Partners: Female  Other Topics Concern   Not on file  Social History Narrative   Not on file   Social Determinants of Health   Financial Resource Strain: Not on file  Food Insecurity: Not on file  Transportation Needs: Not on file  Physical Activity: Not on file  Stress:  Not on file  Social Connections: Not on file     Family History: No history of heart disease in his immediate family  ROS:   Please see the history of present illness.    All other systems reviewed and are negative.  EKGs/Labs/Other Studies Reviewed:    The following studies were reviewed today:  EKG:  EKG is ordered today.  The ekg ordered today demonstrates sinus rhythm, rate 65, LVH with repolarization abnormalities, QTC 520  Recent Labs: 12/18/2020: TSH 2.152 02/17/2021: Magnesium 1.7 02/27/2021: ALT 24; BUN 35; Creatinine 8.65; Hemoglobin 7.9; Platelet Count 82; Potassium 4.6; Sodium 140  Recent Lipid Panel    Component Value Date/Time   TRIG 130 02/15/2021 0618    Physical Exam:    VS:  BP (!) 150/90 (BP Location: Right Arm, Patient Position: Sitting, Cuff Size: Normal)   Pulse 65   Ht 6' (1.829 m)   Wt 169 lb (76.7 kg)   BMI 22.92 kg/m     Wt Readings from Last 3 Encounters:  03/14/21 169 lb (76.7 kg)  03/13/21 166 lb 9.6 oz (75.6 kg)  02/27/21 179 lb 1.6 oz (81.2 kg)     GEN: Well nourished, well developed in no acute distress HEENT: Normal NECK: No JVD; No carotid bruits CARDIAC: RRR, no murmurs, rubs, gallops RESPIRATORY:  Clear to auscultation without rales, wheezing or rhonchi  ABDOMEN: Soft, non-tender, non-distended MUSCULOSKELETAL:  No edema; No deformity  SKIN: Warm and dry NEUROLOGIC:  Alert and oriented x 3 PSYCHIATRIC:  Normal affect   ASSESSMENT:    1. Chronic combined systolic (congestive) and diastolic (congestive) heart failure ([place])   2. Medication management   3. PAF (paroxysmal atrial fibrillation) ([place])   4. Essential hypertension   5. Pancytopenia ([place])     PLAN:    Chronic combined systolic and diastolic heart failure: Echocardiogram 02/15/2021 showed EF 40 to 45%, severe concentric LVH, grade 2 diastolic dysfunction, normal RV function, severe left atrial dilatation, mild right atrial dilatation, mild MR, mild AI, mild aortic  root dilatation measuring 41 mm. -Recommend Lexiscan Myoview to evaluate for obstructive CAD as etiology of heart failure -Continue carvedilol 25 mg twice daily -Not on ACE/ARB/Arni/MRA given ESRD -Continue hydralazine 100 mg twice daily, Imdur 30 mg daily -Volume management via HD  Atrial fibrillation: CHA2DS2-VASc score 2 (hypertension, CHF)  -Anticoagulation was attempted during hospitalization 01/2021 but developed bleeding.  Due to anemia and thrombocytopenia, anticoagulation held -Continue amiodarone for now to maintain sinus rhythm, especially since will be off anticoagulation due to anemia/thrombocytopenia.  Hypertension: On hydralazine 100 mg twice daily, carvedilol 25 mg twice daily, Imdur 30 mg daily  ESRD: On HD  Pancytopenia: Evaluated by hematology during admission 01/2021,  thought to be due to ESRD.   RTC in 3 months     Medication Adjustments/Labs and Tests Ordered: Current medicines are reviewed at length with the patient today.  Concerns regarding medicines are outlined above.  Orders Placed This Encounter  Procedures   CBC   Basic metabolic panel   MYOCARDIAL PERFUSION IMAGING   EKG 12-Lead    No orders of the defined types were placed in this encounter.   Patient Instructions  Medication Instructions:  Your Physician recommend you continue on your current medication as directed.    *If you need a refill on your cardiac medications before your next appointment, please call your pharmacy*   Lab Work: Your physician recommends lab work today (CBC, BMP)  If you have labs (blood work) drawn today and your tests are completely normal, you will receive your results only by: [place] (if you have MyChart) OR A paper copy in the mail If you have any lab test that is abnormal or we need to change your treatment, we will call you to review the results.   Testing/Procedures: Your physician has requested that you have a lexiscan myoview. For further  information please visit [email or web address]. Please follow instruction sheet, as given.  [place]. [address]   Follow-Up: At [facility], you and your health needs are our priority.  As part of our continuing mission to provide you with exceptional heart care, we have created designated Provider Care Teams.  These Care Teams include your primary Cardiologist (physician) and Advanced Practice Providers (APPs -  Physician Assistants and Nurse Practitioners) who all work together to provide you with the care you need, when you need it.  We recommend signing up for the patient portal called "MyChart".  Sign up information is provided on this After Visit Summary.  MyChart is used to connect with patients for Virtual Visits (Telemedicine).  Patients are able to view lab/test results, encounter notes, upcoming appointments, etc.  Non-urgent messages can be sent to your provider as well.   To learn more about what you can do with MyChart, go to [email or web address].    Your next appointment:   3 month(s)  The format for your next appointment:   In Person  Provider:   Oswaldo Milian, MD  You are scheduled for a Myocardial Perfusion Imaging Study.  Please arrive 15 minutes prior to your appointment time for registration and insurance purposes.  The test will take approximately 3 to 4 hours to complete; you may bring reading material.  If someone comes with you to your appointment, they will need to remain in the main lobby due to limited space in the testing area. **If you are pregnant or breastfeeding, please notify the nuclear lab prior to your appointment**  How to prepare for your Myocardial Perfusion Test: Do not eat or drink 3 hours prior to your test, except you may have water. Do not consume products containing caffeine (regular or decaffeinated) 12 hours prior to your test. (ex: coffee, chocolate, sodas, tea). Do bring a list of your current medications with you.  If  not listed below, you may take your medications as normal. Do wear comfortable clothes (no dresses or overalls) and walking shoes, tennis shoes preferred (No heels or open toe shoes are allowed). Do NOT wear cologne, perfume, aftershave, or lotions (deodorant is allowed). If these instructions are not followed, your test will have to be rescheduled.  Please report to [place], [address] for your test.  If you have questions or concerns about your appointment, you can call the Nuclear Lab at [phone].  If you cannot keep your appointment, please provide 24 hours notification to the Nuclear Lab, to avoid a possible $50 charge to your account.     Signed, Donato Heinz, MD  03/14/2021 12:21 PM    [facility]

## 2021-03-13 NOTE — Telephone Encounter (Signed)
RC[employer] Patient Teacher, English as a foreign language completed.    The patient is insured through [employer].  Viread $54.77 Vemlidy $200.00  We will continue to follow to see if copay assistance is needed.  Antonio Keller, [facility] Patient [facility] Phone: [phone] Fax:  [phone]

## 2021-03-13 NOTE — Progress Notes (Signed)
Subjective:    Patient ID: Antonio Keller, male    DOB: 1960-06-26, 60 y.o.   MRN: [id number]  Chief Complaint  Patient presents with   Hepatitis B    HPI:  Antonio Keller is a 60 y.o. male with ESRD on hemodialysis, hypertension, and diastolic congestive heart failure presenting for evaluation of Hepatitis B. He is here with his significant other who provides parts of the history.   Antonio Keller is a 60 y/o AA male who came to the [place] from [place]. Was not told about Hepatitis B infection until recently. Lab work completed on 02/27/21 with positive Hepatitis B surface antigen, Hepatitis B Core antibody, and Hepatitis DNA level of 1,670. Liver function testing was within normal ranges with AST 21 and ALT 24. No current symptoms and denies abdominal pain, nausea, vomiting, diarrhea, fatigue, scleral icterus or jaundice. Has not received any prior treatments. He did receive a blood transfusion in March 2022. No current recreational or illicit drug use, tobacco use or alcohol consumption.    No Known Allergies    Outpatient Medications Prior to Visit  Medication Sig Dispense Refill   allopurinol (ZYLOPRIM) 100 MG tablet Take 0.5 tablets (50 mg total) by mouth daily. 30 tablet 1   amiodarone (PACERONE) 200 MG tablet Take 2 tablets (400 mg total) by mouth daily for 7 days, THEN 1 tablet (200 mg total) daily. 97 tablet 0   atorvastatin (LIPITOR) 40 MG tablet Take 1 tablet (40 mg total) by mouth daily. 90 tablet 1   calcitRIOL (ROCALTROL) 0.25 MCG capsule Take 1 capsule (0.25 mcg total) by mouth daily. 90 capsule 1   calcium acetate (PHOSLO) 667 MG capsule Take 2 capsules (1,334 mg total) by mouth 3 (three) times daily with meals. 270 capsule 1   calcium carbonate (OS-CAL - DOSED IN MG OF ELEMENTAL CALCIUM) 1250 (500 Ca) MG tablet Take 1 tablet (500 mg of elemental calcium total) by mouth 3 (three) times daily with meals. 270 tablet 1   carvedilol (COREG) 25 MG tablet Take 25 mg by mouth 2  (two) times daily.     colchicine 0.6 MG tablet Take 1 tablet (0.6 mg total) by mouth daily. 30 tablet 1   ferrous sulfate 325 (65 FE) MG tablet Take 1 tablet (325 mg total) by mouth daily. 90 tablet 3   hydrALAZINE (APRESOLINE) 100 MG tablet Take 100 mg by mouth 2 (two) times daily.     isosorbide mononitrate (IMDUR) 30 MG 24 hr tablet Take 1 tablet (30 mg total) by mouth daily. 90 tablet 1   Vitamin D3 (VITAMIN D) 25 MCG tablet Take 1 tablet (1,000 Units total) by mouth daily. 30 tablet 0   senna-docusate (SENOKOT-S) 8.6-50 MG tablet Take 1 tablet by mouth 2 (two) times daily between meals as needed for mild constipation. (Patient not taking: Reported on 03/13/2021) 180 tablet 0   No facility-administered medications prior to visit.     Past Medical History:  Diagnosis Date   Hypertension    Renal disorder       Past Surgical History:  Procedure Laterality Date   IR FLUORO GUIDE CV LINE RIGHT  02/15/2021   IR US GUIDE VASC ACCESS RIGHT  02/15/2021      History reviewed. No pertinent family history.    Social History   Socioeconomic History   Marital status: Married    Spouse name: Not on file   Number of children: Not on file   Years of education: Not  on file   Highest education level: Not on file  Occupational History   Not on file  Tobacco Use   Smoking status: Never   Smokeless tobacco: Never  Substance and Sexual Activity   Alcohol use: Not Currently   Drug use: Never   Sexual activity: Yes    Partners: Female  Other Topics Concern   Not on file  Social History Narrative   Not on file   Social Determinants of Health   Financial Resource Strain: Not on file  Food Insecurity: Not on file  Transportation Needs: Not on file  Physical Activity: Not on file  Stress: Not on file  Social Connections: Not on file  Intimate Partner Violence: Not on file      Review of Systems  Constitutional:  Negative for chills, diaphoresis, fatigue and fever.   Respiratory:  Negative for cough, chest tightness, shortness of breath and wheezing.   Cardiovascular:  Negative for chest pain.  Gastrointestinal:  Negative for abdominal distention, abdominal pain, constipation, diarrhea, nausea and vomiting.  Neurological:  Negative for weakness and headaches.  Hematological:  Does not bruise/bleed easily.      Objective:    BP (!) 152/85   Pulse 67   Temp 97.9 F (36.6 C) (Temporal)   Wt 166 lb 9.6 oz (75.6 kg)   SpO2 100%   BMI 22.60 kg/m  Nursing note and vital signs reviewed.  Physical Exam Constitutional:      General: He is not in acute distress.    Appearance: He is well-developed.  Cardiovascular:     Rate and Rhythm: Normal rate and regular rhythm.     Heart sounds: Normal heart sounds. No murmur heard.   No friction rub. No gallop.  Pulmonary:     Effort: Pulmonary effort is normal. No respiratory distress.     Breath sounds: Normal breath sounds. No wheezing or rales.  Chest:     Chest wall: No tenderness.  Abdominal:     General: Bowel sounds are normal. There is no distension.     Palpations: Abdomen is soft. There is no mass.     Tenderness: There is no abdominal tenderness. There is no guarding or rebound.  Skin:    General: Skin is warm and dry.  Neurological:     Mental Status: He is alert and oriented to person, place, and time.  Psychiatric:        Behavior: Behavior normal.        Thought Content: Thought content normal.        Judgment: Judgment normal.        Assessment & Plan:   Patient Active Problem List   Diagnosis Date Noted   Hepatitis B infection 02/22/2021   Demand ischemia (HCC)    Acute systolic heart failure (HCC)    Thrombocytopenia (HCC)    Anemia of chronic disease    ESRD (end stage renal disease) ([place]) 02/17/2021   Nausea and vomiting 02/14/2021   Abdominal pain    New onset atrial fibrillation ([place])    CKD (chronic kidney disease), stage V ([place])    Essential hypertension  12/18/2020     Problem List Items Addressed This Visit       Digestive   Hepatitis B infection - Primary    Antonio Keller is a 60 y/o AA gentleman with 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 Will need additional blood work to complete full picture however at the present time there is no evidence of flare and  AST/ALT are within normal ranges. Briefly discussed the basics of Hepatitis B infection including transmission and risk of progression. Time was limited secondary to partner's constrained schedule. Recommend additional lab work to include Hepatitis B E antigen, Hepatitis B E antibody, liver fibrotest, HIV, Hepatitis A antibody and Hepatitis C antibody. Ultrasound with elastography ordered. He will attempt to see if PCP can draw blood work as he was offered to make a lab appointment here. Plan for follow up in 6 months or sooner if needed.       Relevant Orders   US ABDOMEN COMPLETE W/ELASTOGRAPHY     I am having Macaulay Bellis maintain his carvedilol, Vitamin D3, hydrALAZINE, colchicine, allopurinol, amiodarone, atorvastatin, calcitRIOL, calcium acetate, isosorbide mononitrate, ferrous sulfate, senna-docusate, and calcium carbonate.   Follow-up: Return in about 6 months (around 09/11/2021), or if symptoms worsen or fail to improve.    Terri Piedra, MSN, FNP-C Nurse Practitioner [facility] [place] number: [phone]

## 2021-03-13 NOTE — Patient Instructions (Signed)
Nice to see you.  We will request lab work from your primary care doctor or can be done here at your convenience.   No treatment is needed at this time.  We will arrange an ultrasound for evaluation.  Plan for follow up in 6 months or sooner if needed.

## 2021-03-13 NOTE — Assessment & Plan Note (Addendum)
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 Will need additional blood work to complete full picture however at the present time there is no evidence of flare and AST/ALT are within normal ranges. Briefly discussed the basics of Hepatitis B infection including transmission and risk of progression. Time was limited secondary to partner's constrained schedule. Recommend additional lab work to include Hepatitis B E antigen, Hepatitis B E antibody, liver fibrotest, Hepatitis D antibody, HIV, Hepatitis A antibody and Hepatitis C antibody. Ultrasound with elastography ordered. He will attempt to see if PCP can draw blood work as he was offered to make a lab appointment here. Plan for follow up in 6 months or sooner if needed.

## 2021-03-14 ENCOUNTER — Ambulatory Visit (INDEPENDENT_AMBULATORY_CARE_PROVIDER_SITE_OTHER): Payer: BC Managed Care – PPO | Admitting: Cardiology

## 2021-03-14 ENCOUNTER — Encounter: Payer: Self-pay | Admitting: Cardiology

## 2021-03-14 VITALS — BP 150/90 | HR 65 | Ht 72.0 in | Wt 169.0 lb

## 2021-03-14 DIAGNOSIS — D61818 Other pancytopenia: Secondary | ICD-10-CM

## 2021-03-14 DIAGNOSIS — Z79899 Other long term (current) drug therapy: Secondary | ICD-10-CM

## 2021-03-14 DIAGNOSIS — I480 Paroxysmal atrial fibrillation: Secondary | ICD-10-CM

## 2021-03-14 DIAGNOSIS — I10 Essential (primary) hypertension: Secondary | ICD-10-CM | POA: Diagnosis not present

## 2021-03-14 DIAGNOSIS — I5042 Chronic combined systolic (congestive) and diastolic (congestive) heart failure: Secondary | ICD-10-CM

## 2021-03-14 NOTE — Patient Instructions (Addendum)
Medication Instructions:  Your Physician recommend you continue on your current medication as directed.    *If you need a refill on your cardiac medications before your next appointment, please call your pharmacy*   Lab Work: Your physician recommends lab work today (CBC, BMP)  If you have labs (blood work) drawn today and your tests are completely normal, you will receive your results only by: [place] (if you have MyChart) OR A paper copy in the mail If you have any lab test that is abnormal or we need to change your treatment, we will call you to review the results.   Testing/Procedures: Your physician has requested that you have a lexiscan myoview. For further information please visit [email or web address]. Please follow instruction sheet, as given.  [place]. [address]   Follow-Up: At [facility], you and your health needs are our priority.  As part of our continuing mission to provide you with exceptional heart care, we have created designated Provider Care Teams.  These Care Teams include your primary Cardiologist (physician) and Advanced Practice Providers (APPs -  Physician Assistants and Nurse Practitioners) who all work together to provide you with the care you need, when you need it.  We recommend signing up for the patient portal called "MyChart".  Sign up information is provided on this After Visit Summary.  MyChart is used to connect with patients for Virtual Visits (Telemedicine).  Patients are able to view lab/test results, encounter notes, upcoming appointments, etc.  Non-urgent messages can be sent to your provider as well.   To learn more about what you can do with MyChart, go to [email or web address].    Your next appointment:   3 month(s)  The format for your next appointment:   In Person  Provider:   Oswaldo Milian, MD  You are scheduled for a Myocardial Perfusion Imaging Study.  Please arrive 15 minutes prior to your appointment  time for registration and insurance purposes.  The test will take approximately 3 to 4 hours to complete; you may bring reading material.  If someone comes with you to your appointment, they will need to remain in the main lobby due to limited space in the testing area. **If you are pregnant or breastfeeding, please notify the nuclear lab prior to your appointment**  How to prepare for your Myocardial Perfusion Test: Do not eat or drink 3 hours prior to your test, except you may have water. Do not consume products containing caffeine (regular or decaffeinated) 12 hours prior to your test. (ex: coffee, chocolate, sodas, tea). Do bring a list of your current medications with you.  If not listed below, you may take your medications as normal. Do wear comfortable clothes (no dresses or overalls) and walking shoes, tennis shoes preferred (No heels or open toe shoes are allowed). Do NOT wear cologne, perfume, aftershave, or lotions (deodorant is allowed). If these instructions are not followed, your test will have to be rescheduled.  Please report to [place], [address] for your test.  If you have questions or concerns about your appointment, you can call the Nuclear Lab at [phone].  If you cannot keep your appointment, please provide 24 hours notification to the Nuclear Lab, to avoid a possible $50 charge to your account.

## 2021-03-15 LAB — BASIC METABOLIC PANEL
BUN/Creatinine Ratio: 4 — ABNORMAL LOW (ref 10–24)
BUN: 35 mg/dL — ABNORMAL HIGH (ref 8–27)
CO2: 24 mmol/L (ref 20–29)
Calcium: 10 mg/dL (ref 8.6–10.2)
Chloride: 97 mmol/L (ref 96–106)
Creatinine, Ser: 9.81 mg/dL — ABNORMAL HIGH (ref 0.76–1.27)
Glucose: 103 mg/dL — ABNORMAL HIGH (ref 70–99)
Potassium: 3.9 mmol/L (ref 3.5–5.2)
Sodium: 139 mmol/L (ref 134–144)
eGFR: 6 mL/min/{1.73_m2} — ABNORMAL LOW (ref >59)

## 2021-03-15 LAB — CBC
Hematocrit: 23.3 % — ABNORMAL LOW (ref 37.5–51.0)
Hemoglobin: 7.7 g/dL — ABNORMAL LOW (ref 13.0–17.7)
MCH: 27.8 pg (ref 26.6–33.0)
MCHC: 33 g/dL (ref 31.5–35.7)
MCV: 84 fL (ref 79–97)
Platelets: 111 10*3/uL — ABNORMAL LOW (ref 150–450)
RBC: 2.77 x10E6/uL — ABNORMAL LOW (ref 4.14–5.80)
RDW: 15.7 % — ABNORMAL HIGH (ref 11.6–15.4)
WBC: 4.6 10*3/uL (ref 3.4–10.8)

## 2021-03-17 ENCOUNTER — Telehealth: Payer: Self-pay | Admitting: Cardiology

## 2021-03-17 NOTE — Telephone Encounter (Signed)
Returned call to patient's wife who states patient had an episode of difficulty walking and shaking 2 days ago on 10/15. She states she was told by the cardiologist at office visit on 10/14 to let us know if this occurred. BP recording for 10/15 was taken several hours after the symptoms occurred and was low for the patient, however, BP has been elevated on 10/16 and 10/17. Wife is asking if patient needs to come in to be seen. I asked if he is going for hemodialysis at 12 today and she states he is already on his way. I advised that patient should proceed with that appointment as dialysis will have an impact on his BP and medication changes may need to occur in the future but would not be appropriate at this time. She states she does not know who to call to report her concerns. I advised that patient should keep appointment for myocardial perfusion study on 10/27 and that the nephrologist will likely be monitoring the BP and lab work in relation to dialysis. She thanked me for the call.

## 2021-03-17 NOTE — Telephone Encounter (Signed)
Pt c/o BP issue: STAT if pt c/o blurred vision, one-sided weakness or slurred speech  1. What are your last 5 BP readings?  03/15/21 117/65 03/16/21 180/105 2:00 PM 141/80  03/17/21 172/100 HR 130  2. Are you having any other symptoms (ex. Dizziness, headache, blurred vision, passed out)? Weak, nausea, diarrhea and dizzy   3. What is your BP issue? Fluctuating BP that started Saturday   Had an episode on Saturday where he almost passed out at church. Did not fully pass out, but almost fell over standing up. Episodes of falling started Thursday, but he has not fully passed out. BP fluctuating started Saturday. Antonio Keller has dialysis at 75 today.

## 2021-03-17 NOTE — Telephone Encounter (Signed)
Agree with plan, would defer to nephrology on BP meds, may need to back off antihypertensives if BP dropping low during HD

## 2021-03-19 NOTE — Telephone Encounter (Signed)
Spoke to wife-aware to discuss with nephrologist.  Verbalized understanding.

## 2021-03-25 ENCOUNTER — Telehealth (HOSPITAL_COMMUNITY): Payer: Self-pay | Admitting: *Deleted

## 2021-03-25 ENCOUNTER — Encounter: Payer: Self-pay | Admitting: *Deleted

## 2021-03-25 NOTE — Progress Notes (Signed)
H&P Note     CC:  ESRD Requesting Provider:  Jolinda Croak, MD  HPI: Antonio Keller is a 60 year old right-handed male who presents to clinic today to discuss permanent HD access.  He was noted to have end-stage renal disease during a recent hospitalization and also found to have severe systolic heart failure, anemia, thrombocytopenia.   He is currently being dialyzed through a right IJ tunneled line MWF.  No previous access procedures.  On exam, Antonio Keller was doing well with no complaints.  He was accompanied by his wife.  The pt is on a statin for cholesterol management.  The pt is not on a daily aspirin.   Other AC:  none The pt is on multiple medications for hypertension.   The pt is not diabetic.   Tobacco hx:  none  Past Medical History:  Diagnosis Date   Hypertension    Renal disorder     Past Surgical History:  Procedure Laterality Date   IR FLUORO GUIDE CV LINE RIGHT  02/15/2021   IR US GUIDE VASC ACCESS RIGHT  02/15/2021    Social History   Socioeconomic History   Marital status: Married    Spouse name: Not on file   Number of children: Not on file   Years of education: Not on file   Highest education level: Not on file  Occupational History   Not on file  Tobacco Use   Smoking status: Never   Smokeless tobacco: Never  Substance and Sexual Activity   Alcohol use: Not Currently   Drug use: Never   Sexual activity: Yes    Partners: Female  Other Topics Concern   Not on file  Social History Narrative   Not on file   Social Determinants of Health   Financial Resource Strain: Not on file  Food Insecurity: Not on file  Transportation Needs: Not on file  Physical Activity: Not on file  Stress: Not on file  Social Connections: Not on file  Intimate Partner Violence: Not on file   No family history on file.  Current Outpatient Medications  Medication Sig Dispense Refill   allopurinol (ZYLOPRIM) 100 MG tablet Take 0.5 tablets (50 mg total) by mouth  daily. 30 tablet 1   amiodarone (PACERONE) 200 MG tablet Take 2 tablets (400 mg total) by mouth daily for 7 days, THEN 1 tablet (200 mg total) daily. 97 tablet 0   atorvastatin (LIPITOR) 40 MG tablet Take 1 tablet (40 mg total) by mouth daily. 90 tablet 1   calcitRIOL (ROCALTROL) 0.25 MCG capsule Take 1 capsule (0.25 mcg total) by mouth daily. 90 capsule 1   calcium acetate (PHOSLO) 667 MG capsule Take 2 capsules (1,334 mg total) by mouth 3 (three) times daily with meals. 270 capsule 1   calcium carbonate (OS-CAL - DOSED IN MG OF ELEMENTAL CALCIUM) 1250 (500 Ca) MG tablet Take 1 tablet (500 mg of elemental calcium total) by mouth 3 (three) times daily with meals. 270 tablet 1   carvedilol (COREG) 25 MG tablet Take 25 mg by mouth 2 (two) times daily.     colchicine 0.6 MG tablet Take 1 tablet (0.6 mg total) by mouth daily. 30 tablet 1   ferrous sulfate 325 (65 FE) MG tablet Take 1 tablet (325 mg total) by mouth daily. 90 tablet 3   hydrALAZINE (APRESOLINE) 100 MG tablet Take 100 mg by mouth 2 (two) times daily.     isosorbide mononitrate (IMDUR) 30 MG 24 hr tablet Take 1  tablet (30 mg total) by mouth daily. 90 tablet 1   senna-docusate (SENOKOT-S) 8.6-50 MG tablet Take 1 tablet by mouth 2 (two) times daily between meals as needed for mild constipation. 180 tablet 0   Vitamin D3 (VITAMIN D) 25 MCG tablet Take 1 tablet (1,000 Units total) by mouth daily. 30 tablet 0   No current facility-administered medications for this visit.    No Known Allergies   REVIEW OF SYSTEMS:   [X]  denotes positive finding, [ ]  denotes negative finding Cardiac  Comments:  Chest pain or chest pressure:    Shortness of breath upon exertion:    Short of breath when lying flat:    Irregular heart rhythm:        Vascular    Pain in calf, thigh, or hip brought on by ambulation:    Pain in feet at night that wakes you up from your sleep:     Blood clot in your veins:    Leg swelling:         Pulmonary    Oxygen  at home:    Productive cough:     Wheezing:         Neurologic    Sudden weakness in arms or legs:     Sudden numbness in arms or legs:     Sudden onset of difficulty speaking or slurred speech:    Temporary loss of vision in one eye:     Problems with dizziness:         Gastrointestinal    Blood in stool:     Vomited blood:         Genitourinary    Burning when urinating:     Blood in urine:        Psychiatric    Major depression:         Hematologic    Bleeding problems:    Problems with blood clotting too easily:        Skin    Rashes or ulcers:        Constitutional    Fever or chills:      PHYSICAL EXAMINATION:  There were no vitals filed for this visit.   General:  WDWN in NAD; vital signs documented above Gait: Not observed HENT: WNL, normocephalic Pulmonary: normal non-labored breathing , without Rales, rhonchi,  wheezing Cardiac: irregular HR, with Murmurs  Abdomen: soft, NT, no masses Skin: with rashes Vascular Exam/Pulses:  Right Left  Radial 2+ (normal) 2+ (normal)  Ulnar 2+ (normal) 2+ (normal)                   Extremities: without ischemic changes, without Gangrene , without cellulitis; without open wounds;  Musculoskeletal: no muscle wasting or atrophy  Neurologic: A&O X 3;  No focal weakness or paresthesias are detected Psychiatric:  The pt has Normal affect.   Non-Invasive Vascular Imaging:    Bilateral lower extremity vein mapping demonstrates adequate cephalic vein for left arm brachiocephalic fistula.    ASSESSMENT/PLAN:  Antonio Keller is a right-handed 60 y.o. male who presents with end stage renal disease.  His recent labs were reviewed demonstrating improvement.  Based on vein mapping and examination, this patient's best permanent access option is: Left brachiocephalic. I had an extensive discussion with this patient in regards to the nature of access surgery, including risk, benefits, and alternatives.   The patient is  aware that the risks of access surgery include but are not limited to: bleeding, infection, steal  syndrome, nerve damage, ischemic monomelic neuropathy, failure of access to mature, complications related to venous hypertension, and possible need for additional access procedures in the future. The patient has agreed to proceed with the above.   Antonio John, MD [facility] [phone]

## 2021-03-25 NOTE — Telephone Encounter (Signed)
Close encounter 

## 2021-03-26 ENCOUNTER — Telehealth (HOSPITAL_COMMUNITY): Payer: Self-pay | Admitting: *Deleted

## 2021-03-26 NOTE — Telephone Encounter (Signed)
Close encounter 

## 2021-03-27 ENCOUNTER — Other Ambulatory Visit: Payer: Self-pay | Admitting: *Deleted

## 2021-03-27 ENCOUNTER — Other Ambulatory Visit: Payer: Self-pay

## 2021-03-27 ENCOUNTER — Ambulatory Visit (HOSPITAL_BASED_OUTPATIENT_CLINIC_OR_DEPARTMENT_OTHER)
Admission: RE | Admit: 2021-03-27 | Discharge: 2021-03-27 | Disposition: A | Discharge status: Home / Self Care | Payer: BC Managed Care – PPO | Source: Ambulatory Visit | Attending: Cardiology | Admitting: Cardiology

## 2021-03-27 ENCOUNTER — Emergency Department (HOSPITAL_COMMUNITY)
Admission: EM | Admit: 2021-03-27 | Discharge: 2021-03-27 | Disposition: A | Discharge status: Home / Self Care | Payer: BC Managed Care – PPO | Attending: Emergency Medicine | Admitting: Emergency Medicine

## 2021-03-27 DIAGNOSIS — I132 Hypertensive heart and chronic kidney disease with heart failure and with stage 5 chronic kidney disease, or end stage renal disease: Secondary | ICD-10-CM | POA: Insufficient documentation

## 2021-03-27 DIAGNOSIS — R55 Syncope and collapse: Secondary | ICD-10-CM | POA: Insufficient documentation

## 2021-03-27 DIAGNOSIS — I5021 Acute systolic (congestive) heart failure: Secondary | ICD-10-CM | POA: Insufficient documentation

## 2021-03-27 DIAGNOSIS — R42 Dizziness and giddiness: Secondary | ICD-10-CM | POA: Insufficient documentation

## 2021-03-27 DIAGNOSIS — N186 End stage renal disease: Secondary | ICD-10-CM | POA: Insufficient documentation

## 2021-03-27 DIAGNOSIS — I5042 Chronic combined systolic (congestive) and diastolic (congestive) heart failure: Secondary | ICD-10-CM

## 2021-03-27 DIAGNOSIS — Z992 Dependence on renal dialysis: Secondary | ICD-10-CM | POA: Diagnosis not present

## 2021-03-27 DIAGNOSIS — N185 Chronic kidney disease, stage 5: Secondary | ICD-10-CM

## 2021-03-27 DIAGNOSIS — Z79899 Other long term (current) drug therapy: Secondary | ICD-10-CM | POA: Insufficient documentation

## 2021-03-27 LAB — MYOCARDIAL PERFUSION IMAGING
LV dias vol: 232 mL (ref 62–150)
LV sys vol: 126 mL
Nuc Stress EF: 46 %
Peak HR: 87 {beats}/min
Rest HR: 63 {beats}/min
Rest Nuclear Isotope Dose: 9.6 mCi
SDS: 0
SRS: 0
SSS: 0
ST Depression (mm): 0 mm
Stress Nuclear Isotope Dose: 30.1 mCi
TID: 0.93

## 2021-03-27 LAB — CBC WITH DIFFERENTIAL/PLATELET
Abs Immature Granulocytes: 0.02 10*3/uL (ref 0.00–0.07)
Basophils Absolute: 0 10*3/uL (ref 0.0–0.1)
Basophils Relative: 1 %
Eosinophils Absolute: 0.1 10*3/uL (ref 0.0–0.5)
Eosinophils Relative: 1 %
HCT: 23.3 % — ABNORMAL LOW (ref 39.0–52.0)
Hemoglobin: 7.4 g/dL — ABNORMAL LOW (ref 13.0–17.0)
Immature Granulocytes: 0 %
Lymphocytes Relative: 16 %
Lymphs Abs: 1 10*3/uL (ref 0.7–4.0)
MCH: 28.5 pg (ref 26.0–34.0)
MCHC: 31.8 g/dL (ref 30.0–36.0)
MCV: 89.6 fL (ref 80.0–100.0)
Monocytes Absolute: 0.6 10*3/uL (ref 0.1–1.0)
Monocytes Relative: 10 %
Neutro Abs: 4.5 10*3/uL (ref 1.7–7.7)
Neutrophils Relative %: 72 %
Platelets: 109 10*3/uL — ABNORMAL LOW (ref 150–400)
RBC: 2.6 MIL/uL — ABNORMAL LOW (ref 4.22–5.81)
RDW: 18.6 % — ABNORMAL HIGH (ref 11.5–15.5)
Smear Review: DECREASED
WBC: 6.2 10*3/uL (ref 4.0–10.5)
nRBC: 0 % (ref 0.0–0.2)

## 2021-03-27 LAB — BASIC METABOLIC PANEL
Anion gap: 11 (ref 5–15)
BUN: 28 mg/dL — ABNORMAL HIGH (ref 6–20)
CO2: 26 mmol/L (ref 22–32)
Calcium: 9.4 mg/dL (ref 8.9–10.3)
Chloride: 97 mmol/L — ABNORMAL LOW (ref 98–111)
Creatinine, Ser: 8.27 mg/dL — ABNORMAL HIGH (ref 0.61–1.24)
GFR, Estimated: 7 mL/min — ABNORMAL LOW (ref >60)
Glucose, Bld: 104 mg/dL — ABNORMAL HIGH (ref 70–99)
Potassium: 4.3 mmol/L (ref 3.5–5.1)
Sodium: 134 mmol/L — ABNORMAL LOW (ref 135–145)

## 2021-03-27 LAB — CBG MONITORING, ED: Glucose-Capillary: 101 mg/dL — ABNORMAL HIGH (ref 70–99)

## 2021-03-27 MED ORDER — SODIUM CHLORIDE 0.9 % IV BOLUS
250.0000 mL | Freq: Once | INTRAVENOUS | Status: AC
  Administered 2021-03-27: 250 mL via INTRAVENOUS

## 2021-03-27 MED ORDER — REGADENOSON 0.4 MG/5ML IV SOLN
0.4000 mg | Freq: Once | INTRAVENOUS | Status: AC
  Administered 2021-03-27: 0.4 mg via INTRAVENOUS

## 2021-03-27 MED ORDER — TECHNETIUM TC 99M TETROFOSMIN IV KIT
9.6000 | PACK | Freq: Once | INTRAVENOUS | Status: AC | PRN
  Administered 2021-03-27: 9.6 via INTRAVENOUS
  Filled 2021-03-27: qty 10

## 2021-03-27 MED ORDER — TECHNETIUM TC 99M TETROFOSMIN IV KIT
30.1000 | PACK | Freq: Once | INTRAVENOUS | Status: AC | PRN
  Administered 2021-03-27: 30.1 via INTRAVENOUS
  Filled 2021-03-27: qty 31

## 2021-03-27 NOTE — ED Notes (Signed)
Pt was able to ambulate around the nurses station with independent steady gait. He stated he did not feel dizzy and stated he felt good.

## 2021-03-27 NOTE — Discharge Instructions (Signed)
Follow-up with your cardiologist and nephrologist regarding management of your blood pressure and to recheck on your symptoms.  Return to the emergency room if you have any worsening symptoms.

## 2021-03-27 NOTE — ED Provider Notes (Signed)
[facility] EMERGENCY DEPARTMENT Provider Note   CSN: [id number] Arrival date & time: 03/27/21  1706     History Chief Complaint  Patient presents with   Near Syncope    Tremel Setters is a 60 y.o. male.  Patient is a 60 year old male with a history of hepatitis B, end-stage renal disease, hypertension.  He was recently admitted in September with progression of his end-stage renal disease and started dialysis.  He also had new onset CHF and atrial fibrillation.  His A. fib converted.  He was not started on anticoagulants due to anemia/thrombocytopenia.  He states that he is on dialysis Monday Wednesday Friday.  He completed his session yesterday.  He had a stress test done earlier today.  He went to his primary care doctor's office for some routine blood work and had a near syncopal episode.  He got dizzy and lightheaded.  He had no associated chest pain or shortness of breath.  No unilateral weakness, vision changes or speech deficits.  He denies any recent fevers, cough or cold symptoms.  He has normal urination and is not having any symptoms related to his urination.  He said he has been having these spells for the last few weeks.  His blood pressures been fluctuating between 120s to 140s.  Previously it was more elevated prior to starting new medications.  Today it was in the 16S systolic during this episode.  He was sent here for further evaluation.      Past Medical History:  Diagnosis Date   Hypertension    Renal disorder     Patient Active Problem List   Diagnosis Date Noted   Hepatitis B infection 02/22/2021   Demand ischemia (HCC)    Acute systolic heart failure (HCC)    Thrombocytopenia (HCC)    Anemia of chronic disease    ESRD (end stage renal disease) ([place]) 02/17/2021   Nausea and vomiting 02/14/2021   Abdominal pain    New onset atrial fibrillation ([place])    CKD (chronic kidney disease), stage V ([place])    Essential hypertension 12/18/2020     Past Surgical History:  Procedure Laterality Date   IR FLUORO GUIDE CV LINE RIGHT  02/15/2021   IR US GUIDE VASC ACCESS RIGHT  02/15/2021       No family history on file.  Social History   Tobacco Use   Smoking status: Never   Smokeless tobacco: Never  Substance Use Topics   Alcohol use: Not Currently   Drug use: Never    Home Medications Prior to Admission medications   Medication Sig Start Date End Date Taking? Authorizing Provider  allopurinol (ZYLOPRIM) 100 MG tablet Take 0.5 tablets (50 mg total) by mouth daily. 02/22/21   Mercy Riding, MD  amiodarone (PACERONE) 200 MG tablet Take 2 tablets (400 mg total) by mouth daily for 7 days, THEN 1 tablet (200 mg total) daily. 02/23/21 05/24/21  Mercy Riding, MD  atorvastatin (LIPITOR) 40 MG tablet Take 1 tablet (40 mg total) by mouth daily. 02/23/21   Mercy Riding, MD  calcitRIOL (ROCALTROL) 0.25 MCG capsule Take 1 capsule (0.25 mcg total) by mouth daily. 02/23/21   Mercy Riding, MD  calcium acetate (PHOSLO) 667 MG capsule Take 2 capsules (1,334 mg total) by mouth 3 (three) times daily with meals. 02/22/21   Mercy Riding, MD  calcium carbonate (OS-CAL - DOSED IN MG OF ELEMENTAL CALCIUM) 1250 (500 Ca) MG tablet Take 1 tablet (500  mg of elemental calcium total) by mouth 3 (three) times daily with meals. 02/22/21   Mercy Riding, MD  carvedilol (COREG) 25 MG tablet Take 25 mg by mouth 2 (two) times daily. 04/26/18   [provider]  colchicine 0.6 MG tablet Take 1 tablet (0.6 mg total) by mouth daily. 02/22/21   Mercy Riding, MD  ferrous sulfate 325 (65 FE) MG tablet Take 1 tablet (325 mg total) by mouth daily. 02/22/21 02/22/22  Mercy Riding, MD  hydrALAZINE (APRESOLINE) 100 MG tablet Take 100 mg by mouth 2 (two) times daily. 01/27/21   [provider]  isosorbide mononitrate (IMDUR) 30 MG 24 hr tablet Take 1 tablet (30 mg total) by mouth daily. 02/23/21   Mercy Riding, MD  senna-docusate (SENOKOT-S) 8.6-50 MG tablet  Take 1 tablet by mouth 2 (two) times daily between meals as needed for mild constipation. 02/22/21   Mercy Riding, MD  Vitamin D3 (VITAMIN D) 25 MCG tablet Take 1 tablet (1,000 Units total) by mouth daily. 12/20/20   Harvie Heck, MD    Allergies    Patient has no known allergies.  Review of Systems   Review of Systems  Constitutional:  Positive for fatigue. Negative for chills, diaphoresis and fever.  HENT:  Negative for congestion, rhinorrhea and sneezing.   Eyes: Negative.   Respiratory:  Negative for cough, chest tightness and shortness of breath.   Cardiovascular:  Negative for chest pain and leg swelling.  Gastrointestinal:  Negative for abdominal pain, blood in stool, diarrhea, nausea and vomiting.  Genitourinary:  Negative for difficulty urinating, flank pain, frequency and hematuria.  Musculoskeletal:  Negative for arthralgias and back pain.  Skin:  Negative for rash.  Neurological:  Positive for syncope (Near syncope) and light-headedness. Negative for dizziness, speech difficulty, weakness, numbness and headaches.   Physical Exam Updated Vital Signs BP (!) 146/130   Pulse 65   Temp 97.9 F (36.6 C) (Oral)   Resp 17   Ht 6' (1.829 m)   Wt 79.8 kg   SpO2 100%   BMI 23.87 kg/m   Physical Exam Constitutional:      Appearance: He is well-developed.  HENT:     Head: Normocephalic and atraumatic.  Eyes:     Pupils: Pupils are equal, round, and reactive to light.  Cardiovascular:     Rate and Rhythm: Normal rate and regular rhythm.     Heart sounds: Normal heart sounds.  Pulmonary:     Effort: Pulmonary effort is normal. No respiratory distress.     Breath sounds: Normal breath sounds. No wheezing or rales.  Chest:     Chest wall: No tenderness.  Abdominal:     General: Bowel sounds are normal.     Palpations: Abdomen is soft.     Tenderness: There is no abdominal tenderness. There is no guarding or rebound.  Musculoskeletal:        General: Normal range of  motion.     Cervical back: Normal range of motion and neck supple.  Lymphadenopathy:     Cervical: No cervical adenopathy.  Skin:    General: Skin is warm and dry.     Findings: No rash.  Neurological:     Mental Status: He is alert and oriented to person, place, and time.     Comments: Motor 5/5 all extremities Sensation grossly intact to LT all extremities Finger to Nose intact, no pronator drift CN II-XII grossly intact  ED Results / Procedures / Treatments   Labs (all labs ordered are listed, but only abnormal results are displayed) Labs Reviewed  BASIC METABOLIC PANEL - Abnormal; Notable for the following components:      Result Value   Sodium 134 (*)    Chloride 97 (*)    Glucose, Bld 104 (*)    BUN 28 (*)    Creatinine, Ser 8.27 (*)    GFR, Estimated 7 (*)    All other components within normal limits  CBC WITH DIFFERENTIAL/PLATELET - Abnormal; Notable for the following components:   RBC 2.60 (*)    Hemoglobin 7.4 (*)    HCT 23.3 (*)    RDW 18.6 (*)    Platelets 109 (*)    All other components within normal limits  CBG MONITORING, ED - Abnormal; Notable for the following components:   Glucose-Capillary 101 (*)    All other components within normal limits    EKG None  Radiology MYOCARDIAL PERFUSION IMAGING  Result Date: 03/27/2021   Findings are consistent with no ischemia. The study is intermediate risk.   No ST deviation was noted.   LV perfusion is normal. There is no evidence of ischemia. There is no evidence of infarction.   Left ventricular function is abnormal. Nuclear stress EF: 46 %. The left ventricular ejection fraction is mildly decreased (45-54%). End diastolic cavity size is severely enlarged. End systolic cavity size is severely enlarged.   Prior study not available for comparison.    Procedures Procedures   Medications Ordered in ED Medications  sodium chloride 0.9 % bolus 250 mL (0 mLs Intravenous Stopped 03/27/21 2118)    ED Course   I have reviewed the triage vital signs and the nursing notes.  Pertinent labs & imaging results that were available during my care of the patient were reviewed by me and considered in my medical decision making (see chart for details).    MDM Rules/Calculators/A&P                           Patient is a 60 year old male who presents with an episode of near syncope.  He is neurologically intact.  No associated chest pain or shortness of breath.  His blood pressures been stable in the ED.  His labs are similar to prior values.  He has anemia but it is unchanged from his baseline values.  His potassium is normal.  His EKG does not show any ischemic changes.  He was given a small bolus of IV fluids.  He is feeling much better after this.  He is able to ambulate without any dizziness.  He was discharged home in good condition.  He has dialysis tomorrow.  He was encouraged to follow-up with his cardiologist and nephrologist regarding management of his blood pressure given his ongoing symptoms.  Return precautions were given. Final Clinical Impression(s) / ED Diagnoses Final diagnoses:  Near syncope    Rx / DC Orders ED Discharge Orders     None        Malvin Johns, MD 03/27/21 2153

## 2021-03-27 NOTE — ED Triage Notes (Signed)
Pt was at PCP when he became dizzy when changing positions and had a near syncopal episode, there was a second episode where he appeared to be starring off into space, at the PCP office his BP was 70/58, he is a dialysis pt and is due to receive tx tomorrow. After 80mL NS pt BP went up to 124/74

## 2021-03-27 NOTE — ED Notes (Signed)
Pt was given [place] sandwich and cup of water per PO challenge.

## 2021-03-27 NOTE — ED Notes (Signed)
Pt reports dizziness and near syncopal episode during PCP visit when ambulating. He reports that this often happens when he hasn't had enough fluid. He is scared to drink too much bc of his dialysis and kidney fx.

## 2021-03-28 ENCOUNTER — Ambulatory Visit (INDEPENDENT_AMBULATORY_CARE_PROVIDER_SITE_OTHER)
Admission: RE | Admit: 2021-03-28 | Discharge: 2021-03-28 | Disposition: A | Discharge status: Home / Self Care | Payer: BC Managed Care – PPO | Source: Ambulatory Visit | Attending: Vascular Surgery | Admitting: Vascular Surgery

## 2021-03-28 ENCOUNTER — Encounter (HOSPITAL_COMMUNITY): Payer: Self-pay

## 2021-03-28 ENCOUNTER — Ambulatory Visit (INDEPENDENT_AMBULATORY_CARE_PROVIDER_SITE_OTHER): Payer: BC Managed Care – PPO | Admitting: Vascular Surgery

## 2021-03-28 ENCOUNTER — Ambulatory Visit (HOSPITAL_COMMUNITY)
Admission: RE | Admit: 2021-03-28 | Discharge: 2021-03-28 | Disposition: A | Discharge status: Home / Self Care | Payer: BC Managed Care – PPO | Source: Ambulatory Visit | Attending: Vascular Surgery | Admitting: Vascular Surgery

## 2021-03-28 ENCOUNTER — Other Ambulatory Visit: Payer: Self-pay

## 2021-03-28 ENCOUNTER — Encounter: Payer: Self-pay | Admitting: Vascular Surgery

## 2021-03-28 VITALS — BP 133/76 | HR 67 | Temp 98.5°F | Resp 20 | Ht 72.0 in | Wt 172.0 lb

## 2021-03-28 DIAGNOSIS — N185 Chronic kidney disease, stage 5: Secondary | ICD-10-CM

## 2021-03-28 DIAGNOSIS — N186 End stage renal disease: Secondary | ICD-10-CM

## 2021-03-28 DIAGNOSIS — Z992 Dependence on renal dialysis: Secondary | ICD-10-CM

## 2021-03-31 ENCOUNTER — Encounter (HOSPITAL_COMMUNITY): Payer: Self-pay | Admitting: Vascular Surgery

## 2021-03-31 ENCOUNTER — Other Ambulatory Visit: Payer: Self-pay

## 2021-03-31 NOTE — Progress Notes (Signed)
DUE TO COVID-19 ONLY ONE VISITOR IS ALLOWED TO COME WITH YOU AND STAY IN THE WAITING ROOM ONLY DURING PRE OP AND PROCEDURE DAY OF SURGERY.   PCP - Dr Melissa Montane Cardiologist - Dr Oswaldo Milian Nephrology - Dr Red Christians Oncology - Dr Curt Bears  Chest x-ray - 02/14/21 (2V) EKG - 03/27/21 Stress Test - 03/27/21 ECHO - 02/15/21 Cardiac Cath - n/a  ICD Pacemaker/Loop - n/a  Sleep Study -  n/a CPAP - none  Diabetes - n/a  Anesthesia review: Yes  STOP now taking any Aspirin (unless otherwise instructed by your surgeon), Aleve, Naproxen, Ibuprofen, Motrin, Advil, Goody's, BC's, all herbal medications, fish oil, and all vitamins.   Coronavirus Screening Covid test n/a Ambulatory Surgery  Do you have any of the following symptoms:  Cough yes/no: No Fever (>100.48F)  yes/no: No Runny nose yes/no: No Sore throat yes/no: No Difficulty breathing/shortness of breath  yes/no: No  Have you traveled in the last 14 days and where? yes/no: No  Wife Antonio Keller verbalized understanding of instructions that were via phone.

## 2021-03-31 NOTE — Anesthesia Preprocedure Evaluation (Addendum)
Anesthesia Evaluation  Patient identified by MRN, date of birth, ID band Patient awake    Reviewed: Allergy & Precautions, H&P , NPO status , Patient's Chart, lab work & pertinent test results  Airway Mallampati: II   Neck ROM: full    Dental   Pulmonary neg pulmonary ROS,    breath sounds clear to auscultation       Cardiovascular hypertension, + dysrhythmias Atrial Fibrillation  Rhythm:regular Rate:Normal  TTE (01/2021): EF 40-45%, mild MR, severely dilated LA.   Neuro/Psych    GI/Hepatic   Endo/Other    Renal/GU ESRF and DialysisRenal disease     Musculoskeletal   Abdominal   Peds  Hematology  (+) Blood dyscrasia, anemia ,   Anesthesia Other Findings   Reproductive/Obstetrics                            Anesthesia Physical Anesthesia Plan  ASA: 3  Anesthesia Plan: General   Post-op Pain Management:    Induction: Intravenous  PONV Risk Score and Plan: 2 and Ondansetron, Dexamethasone and Treatment may vary due to age or medical condition  Airway Management Planned: LMA  Additional Equipment:   Intra-op Plan:   Post-operative Plan: Extubation in OR  Informed Consent: I have reviewed the patients History and Physical, chart, labs and discussed the procedure including the risks, benefits and alternatives for the proposed anesthesia with the patient or authorized representative who has indicated his/her understanding and acceptance.     Dental advisory given  Plan Discussed with: CRNA, Surgeon and Anesthesiologist  Anesthesia Plan Comments: (PAT note written 03/31/2021 by Myra Gianotti, PA-C. )       Anesthesia Quick Evaluation

## 2021-03-31 NOTE — Progress Notes (Signed)
Anesthesia Chart Review: Antonio Keller  Case: [id number] Date/Time: 04/01/21 0715   Procedure: LEFT ARM ARTERIOVENOUS (AV) FISTULA CREATION (Left)   Anesthesia type: Choice   Pre-op diagnosis: ESRD   Location: [facility] OR ROOM 12 / [place] OR   Surgeons: Broadus John, MD       DISCUSSION: Patient is a 60 year old male scheduled for the above procedure.  Tunneled dialysis catheter placed via right IJ vein 02/15/2021.  History includes never smoker, ESRD (HD started 02/16/21, MWF), HTN, chronic systolic CHF (LVEF 70%, non-ischemic stress test 03/27/21, LVEF 40-45% 02/15/21 echo, likely hypertensive cardiomyopathy), afib (11/2020), hepatitis B (+ test 01/2021), anemia, thrombocytopenia.  - [place] ED visit 03/27/21 for near syncope. BP 70/58 at PCP office. Per ED MD notes, neurologically intact, no chest pain or SOB, BP stable in ED, labs stable, EKG without ischemic changes. He felt better after small IVF bolus and was able to ambulate without dizziness. Discharged home with plans for dialysis the following day. (Cardiology previously stated on 03/17/21 to defer BP medication management to nephrology, and that he may need to back on antihypertensives if BP dropping with HD.)  - [place] admission 02/14/21-02/22/21 for uremic symptoms with acute on chronic kidney failure, and also found to be in afib with RVR with acute combined CHF with EF 40-45%. He converted to SR on amiodarone gtt. Right IJ TDC placed 02/15/21 and started on HD 02/16/21. Vascular surgery was consulted regarding permanent HD access but deferred due to thrombocytopenia and some bleeding at [facility] site while on anticoagulation for A. fib.  Cardiology did not resume anticoagulation due to thrombocytopenia with bleeding for catheter site. HEM consulted (PLT low 43K, HGB 6.4, s/p PRBC). He was also found to be + for hepatitis B and referred for out-patient ID evaluation. Out-patient primary care, hematology, nephrology, and cardiology follow-up also  planned.   - Last cardiology visit 03/14/21 with Dr. Gardiner Rhyme. EF 40-45% 01/2021. Volume management per HD. Maintaining SR. Continue amiodarone for now since off anticoagulation for anemia/thrombocytopenia. He had not yet undergone ordered stress test, but done on 03/27/21 and was non-ischemic, EF 46%. Three month follow-up planned.   - Seen by Mauricio Po, FNP with ID on 03/14/19 for hepatitis B. He moved to the [place] for [place] ~ 10-12 years ago. He did not know of hepatitis B diagnosis until 02/16/21 & 02/22/21 labs. Chronic hepatitis B now suspected--"at the present time there is no evidence of flare and AST/ALT are within normal ranges". Plan for future additional blood work, liver fibrotest, [place] with elastography. Six month follow-up planned.   - Last hematology visit 02/27/21 by Dr. Julien Nordmann for anemia and thrombocytopenia, felt likely due to ESRD and hepatitis B. He did not think there was an urgent need to proceed with bone marrow biopsy and aspirate at that point.  He may need iron infusion per nephrology.  43-monthfollow-up planned. PLT cont 109K and H/H 7.4/23.3 on 03/27/21 (although HGB primarily in the 7 range since 01/2021).   He is a same day work-up. Anesthesia team to evaluate on the day of surgery.    VS:  BP Readings from Last 3 Encounters:  03/28/21 133/76  03/27/21 (!) 156/87  03/14/21 (!) 150/90   Pulse Readings from Last 3 Encounters:  03/28/21 67  03/27/21 71  03/14/21 65     PROVIDERS: MJolinda Croak MD is PCP  SOswaldo Milian MD is cardiologist GCorliss Parish MD is nephrologist MCurt Bears MD is HEM-ONC  LABS: Labs on arrival as indicated. Latest results include: Lab Results  Component Value Date   WBC 6.2 03/27/2021   HGB 7.4 (L) 03/27/2021   HCT 23.3 (L) 03/27/2021   PLT 109 (L) 03/27/2021   GLUCOSE 104 (H) 03/27/2021   TRIG 130 02/15/2021   ALT 24 02/27/2021   AST 21 02/27/2021   NA 134 (L) 03/27/2021   K 4.3 03/27/2021    CL 97 (L) 03/27/2021   CREATININE 8.27 (H) 03/27/2021   BUN 28 (H) 03/27/2021   CO2 26 03/27/2021   TSH 2.152 12/18/2020   INR 1.3 (H) 02/19/2021   MICROALBUR 745.8 (H) 12/18/2020     IMAGES: CXR 02/14/21: FINDINGS: Mild cardiomegaly. No focal opacity or pleural effusion. Aortic atherosclerosis. No pneumothorax  IMPRESSION: No active disease.  Mild cardiomegaly    EKG: 03/27/21: Sinus rhythm Prolonged PR interval [PR 216 ms] LVH with secondary repolarization abnormality Borderline prolonged QT interval similar to prior EKGs Confirmed by Malvin Johns [phone]) on 03/27/2021 5:46:14 PM   CV: Nuclear stress test 03/27/21:   Findings are consistent with no ischemia. The study is intermediate risk.   No ST deviation was noted.   LV perfusion is normal. There is no evidence of ischemia. There is no evidence of infarction.   Left ventricular function is abnormal. Nuclear stress EF: 46 %. The left ventricular ejection fraction is mildly decreased (45-54%). End diastolic cavity size is severely enlarged. End systolic cavity size is severely enlarged.   Prior study not available for comparison.   Echo 02/15/21: IMPRESSIONS   1. Left ventricular ejection fraction, by estimation, is 40 to 45%. The  left ventricle has mildly decreased function. The left ventricle has no  regional wall motion abnormalities. There is severe concentric left  ventricular hypertrophy. Left ventricular   diastolic parameters are consistent with Grade II diastolic dysfunction  (pseudonormalization). Elevated left ventricular end-diastolic pressure.   2. Right ventricular systolic function is normal. The right ventricular  size is normal. Tricuspid regurgitation signal is inadequate for assessing  PA pressure.   3. Left atrial size was severely dilated.   4. Right atrial size was mildly dilated.   5. The mitral valve is grossly normal. Mild mitral valve regurgitation.   6. The aortic valve is tricuspid.  There is mild thickening of the aortic  valve. Aortic valve regurgitation is trivial. No aortic stenosis is  present. Aortic valve mean gradient measures 6.0 mmHg.   7. Aortic dilatation noted. There is mild dilatation of the aortic root,  measuring 41 mm.   8. The inferior vena cava is normal in size with <50% respiratory  variability, suggesting right atrial pressure of 8 mmHg.  - Comparison(s): No prior Echocardiogram.    Past Medical History:  Diagnosis Date   A-fib ([place])    Anemia    Gout    Hepatitis B    learned of diagnosis 01/2021   Hypertension    Renal disorder    dialysis M-W-F, stage 5   Systolic heart failure (HCC)    Thrombocytopenia (HCC)     Past Surgical History:  Procedure Laterality Date   IR FLUORO GUIDE CV LINE RIGHT  02/15/2021   IR US GUIDE VASC ACCESS RIGHT  02/15/2021    MEDICATIONS: No current facility-administered medications for this encounter.    allopurinol (ZYLOPRIM) 100 MG tablet   amiodarone (PACERONE) 200 MG tablet   atorvastatin (LIPITOR) 40 MG tablet   calcitRIOL (ROCALTROL) 0.25 MCG capsule   calcium acetate (  PHOSLO) 667 MG capsule   calcium carbonate (OS-CAL - DOSED IN MG OF ELEMENTAL CALCIUM) 1250 (500 Ca) MG tablet   carvedilol (COREG) 25 MG tablet   chlorthalidone (HYGROTON) 25 MG tablet   colchicine 0.6 MG tablet   ferrous sulfate 325 (65 FE) MG tablet   hydrALAZINE (APRESOLINE) 50 MG tablet   isosorbide mononitrate (IMDUR) 30 MG 24 hr tablet   senna-docusate (SENOKOT-S) 8.6-50 MG tablet   Vitamin D3 (VITAMIN D) 25 MCG tablet    Myra Gianotti, PA-C Surgical Short Stay/Anesthesiology [facility] Phone [phone] [facility] Phone [phone] 03/31/2021 4:12 PM

## 2021-04-01 ENCOUNTER — Encounter (HOSPITAL_COMMUNITY)
Admission: RE | Disposition: A | Discharge status: Home / Self Care | Payer: Self-pay | Source: Home / Self Care | Attending: Vascular Surgery

## 2021-04-01 ENCOUNTER — Other Ambulatory Visit: Payer: Self-pay

## 2021-04-01 ENCOUNTER — Ambulatory Visit (HOSPITAL_COMMUNITY): Payer: BC Managed Care – PPO | Admitting: Vascular Surgery

## 2021-04-01 ENCOUNTER — Ambulatory Visit (HOSPITAL_COMMUNITY)
Admission: RE | Admit: 2021-04-01 | Discharge: 2021-04-01 | Disposition: A | Discharge status: Home / Self Care | Payer: BC Managed Care – PPO | Attending: Vascular Surgery | Admitting: Vascular Surgery

## 2021-04-01 ENCOUNTER — Encounter (HOSPITAL_COMMUNITY): Payer: Self-pay | Admitting: Vascular Surgery

## 2021-04-01 DIAGNOSIS — I5022 Chronic systolic (congestive) heart failure: Secondary | ICD-10-CM | POA: Diagnosis not present

## 2021-04-01 DIAGNOSIS — I4891 Unspecified atrial fibrillation: Secondary | ICD-10-CM | POA: Insufficient documentation

## 2021-04-01 DIAGNOSIS — N186 End stage renal disease: Secondary | ICD-10-CM | POA: Diagnosis not present

## 2021-04-01 DIAGNOSIS — Z79899 Other long term (current) drug therapy: Secondary | ICD-10-CM | POA: Insufficient documentation

## 2021-04-01 DIAGNOSIS — I132 Hypertensive heart and chronic kidney disease with heart failure and with stage 5 chronic kidney disease, or end stage renal disease: Secondary | ICD-10-CM | POA: Insufficient documentation

## 2021-04-01 DIAGNOSIS — Z992 Dependence on renal dialysis: Secondary | ICD-10-CM | POA: Insufficient documentation

## 2021-04-01 HISTORY — PX: AV FISTULA PLACEMENT: SHX1204

## 2021-04-01 HISTORY — DX: Unspecified atrial fibrillation: I48.91

## 2021-04-01 HISTORY — DX: Unspecified systolic (congestive) heart failure: I50.20

## 2021-04-01 HISTORY — DX: Thrombocytopenia, unspecified: D69.6

## 2021-04-01 HISTORY — DX: Gout, unspecified: M10.9

## 2021-04-01 HISTORY — DX: Anemia, unspecified: D64.9

## 2021-04-01 HISTORY — DX: Unspecified viral hepatitis B without hepatic coma: B19.10

## 2021-04-01 LAB — POCT I-STAT, CHEM 8
BUN: 29 mg/dL — ABNORMAL HIGH (ref 6–20)
Calcium, Ion: 1.2 mmol/L (ref 1.15–1.40)
Chloride: 94 mmol/L — ABNORMAL LOW (ref 98–111)
Creatinine, Ser: 10.2 mg/dL — ABNORMAL HIGH (ref 0.61–1.24)
Glucose, Bld: 93 mg/dL (ref 70–99)
HCT: 24 % — ABNORMAL LOW (ref 39.0–52.0)
Hemoglobin: 8.2 g/dL — ABNORMAL LOW (ref 13.0–17.0)
Potassium: 4.2 mmol/L (ref 3.5–5.1)
Sodium: 134 mmol/L — ABNORMAL LOW (ref 135–145)
TCO2: 29 mmol/L (ref 22–32)

## 2021-04-01 SURGERY — ARTERIOVENOUS (AV) FISTULA CREATION
Anesthesia: General | Site: Arm Lower | Laterality: Left

## 2021-04-01 MED ORDER — FENTANYL CITRATE (PF) 100 MCG/2ML IJ SOLN: 25.0000 ug | INTRAMUSCULAR | Status: DC | PRN

## 2021-04-01 MED ORDER — DEXAMETHASONE SODIUM PHOSPHATE 4 MG/ML IJ SOLN
INTRAMUSCULAR | Status: DC | PRN
  Administered 2021-04-01: 8 mg via INTRAVENOUS

## 2021-04-01 MED ORDER — OXYCODONE HCL 5 MG PO TABS: 5.0000 mg | ORAL_TABLET | Freq: Once | ORAL | Status: DC | PRN

## 2021-04-01 MED ORDER — ORAL CARE MOUTH RINSE: 15.0000 mL | Freq: Once | OROMUCOSAL | Status: AC

## 2021-04-01 MED ORDER — PROPOFOL 10 MG/ML IV BOLUS
INTRAVENOUS | Status: DC | PRN
  Administered 2021-04-01: 200 mg via INTRAVENOUS

## 2021-04-01 MED ORDER — PHENYLEPHRINE HCL-NACL 20-0.9 MG/250ML-% IV SOLN
INTRAVENOUS | Status: DC | PRN
  Administered 2021-04-01: 50 ug/min via INTRAVENOUS

## 2021-04-01 MED ORDER — OXYCODONE HCL 5 MG/5ML PO SOLN: 5.0000 mg | Freq: Once | ORAL | Status: DC | PRN

## 2021-04-01 MED ORDER — HEMOSTATIC AGENTS (NO CHARGE) OPTIME
TOPICAL | Status: DC | PRN
  Administered 2021-04-01: 1 via TOPICAL

## 2021-04-01 MED ORDER — CHLORHEXIDINE GLUCONATE 0.12 % MT SOLN
OROMUCOSAL | Status: AC
  Administered 2021-04-01: 15 mL via OROMUCOSAL
  Filled 2021-04-01: qty 15

## 2021-04-01 MED ORDER — HEPARIN 6000 UNIT IRRIGATION SOLUTION
Status: DC | PRN
  Administered 2021-04-01: 1

## 2021-04-01 MED ORDER — ONDANSETRON HCL 4 MG/2ML IJ SOLN
INTRAMUSCULAR | Status: DC | PRN
  Administered 2021-04-01: 4 mg via INTRAVENOUS

## 2021-04-01 MED ORDER — LIDOCAINE HCL (PF) 1 % IJ SOLN
INTRAMUSCULAR | Status: AC
  Filled 2021-04-01: qty 30

## 2021-04-01 MED ORDER — CEFAZOLIN SODIUM-DEXTROSE 2-4 GM/100ML-% IV SOLN
2.0000 g | INTRAVENOUS | Status: AC
  Administered 2021-04-01: 2 g via INTRAVENOUS
  Filled 2021-04-01: qty 100

## 2021-04-01 MED ORDER — OXYCODONE-ACETAMINOPHEN 5-325 MG PO TABS
ORAL_TABLET | ORAL | Status: AC
  Filled 2021-04-01: qty 1

## 2021-04-01 MED ORDER — CHLORHEXIDINE GLUCONATE 0.12 % MT SOLN: 15.0000 mL | Freq: Once | OROMUCOSAL | Status: AC

## 2021-04-01 MED ORDER — PAPAVERINE HCL 30 MG/ML IJ SOLN
INTRAMUSCULAR | Status: AC
  Filled 2021-04-01: qty 2

## 2021-04-01 MED ORDER — EPHEDRINE SULFATE 50 MG/ML IJ SOLN
INTRAMUSCULAR | Status: DC | PRN
  Administered 2021-04-01: 10 mg via INTRAVENOUS
  Administered 2021-04-01: 15 mg via INTRAVENOUS
  Administered 2021-04-01: 10 mg via INTRAVENOUS

## 2021-04-01 MED ORDER — FENTANYL CITRATE (PF) 100 MCG/2ML IJ SOLN
INTRAMUSCULAR | Status: DC | PRN
  Administered 2021-04-01 (×3): 50 ug via INTRAVENOUS

## 2021-04-01 MED ORDER — ONDANSETRON HCL 4 MG/2ML IJ SOLN
4.0000 mg | Freq: Four times a day (QID) | INTRAMUSCULAR | Status: DC | PRN
Start: 2021-04-01 — End: 2021-04-01

## 2021-04-01 MED ORDER — SODIUM CHLORIDE 0.9 % IV SOLN
INTRAVENOUS | Status: DC
Start: 2021-04-01 — End: 2021-04-01

## 2021-04-01 MED ORDER — PHENYLEPHRINE 40 MCG/ML (10ML) SYRINGE FOR IV PUSH (FOR BLOOD PRESSURE SUPPORT)
PREFILLED_SYRINGE | INTRAVENOUS | Status: DC | PRN
  Administered 2021-04-01: 160 ug via INTRAVENOUS

## 2021-04-01 MED ORDER — LIDOCAINE-EPINEPHRINE (PF) 1 %-1:200000 IJ SOLN
INTRAMUSCULAR | Status: AC
  Filled 2021-04-01: qty 30

## 2021-04-01 MED ORDER — FENTANYL CITRATE (PF) 250 MCG/5ML IJ SOLN
INTRAMUSCULAR | Status: AC
  Filled 2021-04-01: qty 5

## 2021-04-01 MED ORDER — HEPARIN 6000 UNIT IRRIGATION SOLUTION
Status: AC
  Filled 2021-04-01: qty 500

## 2021-04-01 MED ORDER — PAPAVERINE HCL 30 MG/ML IJ SOLN
INTRAMUSCULAR | Status: DC | PRN
  Administered 2021-04-01: 60 mg via INTRAVENOUS

## 2021-04-01 MED ORDER — OXYCODONE-ACETAMINOPHEN 5-325 MG PO TABS: 1.0000 | ORAL_TABLET | Freq: Four times a day (QID) | ORAL | 0 refills | Status: DC | PRN

## 2021-04-01 MED ORDER — LIDOCAINE HCL (CARDIAC) PF 100 MG/5ML IV SOSY
PREFILLED_SYRINGE | INTRAVENOUS | Status: DC | PRN
  Administered 2021-04-01: 60 mg via INTRAVENOUS

## 2021-04-01 MED ORDER — SODIUM CHLORIDE 0.9 % IV SOLN: INTRAVENOUS | Status: DC

## 2021-04-01 MED ORDER — CHLORHEXIDINE GLUCONATE 4 % EX LIQD: 60.0000 mL | Freq: Once | CUTANEOUS | Status: DC

## 2021-04-01 MED ORDER — 0.9 % SODIUM CHLORIDE (POUR BTL) OPTIME
TOPICAL | Status: DC | PRN
  Administered 2021-04-01 (×2): 1000 mL

## 2021-04-01 MED ORDER — CHLORHEXIDINE GLUCONATE 4 % EX LIQD
60.0000 mL | Freq: Once | CUTANEOUS | Status: DC
Start: 2021-04-02 — End: 2021-04-01

## 2021-04-01 MED ORDER — HEPARIN SODIUM (PORCINE) 1000 UNIT/ML IJ SOLN
INTRAMUSCULAR | Status: DC | PRN
  Administered 2021-04-01: 3000 [IU] via INTRAVENOUS

## 2021-04-01 MED ORDER — GLYCOPYRROLATE 0.2 MG/ML IJ SOLN
INTRAMUSCULAR | Status: DC | PRN
  Administered 2021-04-01: .2 mg via INTRAVENOUS

## 2021-04-01 MED ORDER — OXYCODONE-ACETAMINOPHEN 5-325 MG PO TABS
1.0000 | ORAL_TABLET | Freq: Four times a day (QID) | ORAL | Status: DC | PRN
  Administered 2021-04-01: 1 via ORAL

## 2021-04-01 SURGICAL SUPPLY — 38 items
ARMBAND PINK RESTRICT EXTREMIT (MISCELLANEOUS) ×4 IMPLANT
BAG COUNTER SPONGE SURGICOUNT (BAG) ×2 IMPLANT
BLADE CLIPPER SURG (BLADE) ×2 IMPLANT
BNDG ELASTIC 6X10 VLCR STRL LF (GAUZE/BANDAGES/DRESSINGS) ×2 IMPLANT
CANISTER SUCT 3000ML PPV (MISCELLANEOUS) ×2 IMPLANT
CLIP TI WIDE RED SMALL 6 (CLIP) ×2 IMPLANT
CLIP VESOCCLUDE MED 6/CT (CLIP) ×2 IMPLANT
CLIP VESOCCLUDE SM WIDE 6/CT (CLIP) ×2 IMPLANT
COVER PROBE W GEL 5X96 (DRAPES) ×2 IMPLANT
DECANTER SPIKE VIAL GLASS SM (MISCELLANEOUS) ×2 IMPLANT
DERMABOND ADVANCED (GAUZE/BANDAGES/DRESSINGS) ×1
DERMABOND ADVANCED .7 DNX12 (GAUZE/BANDAGES/DRESSINGS) ×1 IMPLANT
ELECT REM PT RETURN 9FT ADLT (ELECTROSURGICAL) ×2
ELECTRODE REM PT RTRN 9FT ADLT (ELECTROSURGICAL) ×1 IMPLANT
GAUZE 4X4 16PLY ~~LOC~~+RFID DBL (SPONGE) ×2 IMPLANT
GLOVE SRG 8 PF TXTR STRL LF DI (GLOVE) ×2 IMPLANT
GLOVE SURG POLYISO LF SZ8 (GLOVE) IMPLANT
GLOVE SURG UNDER POLY LF SZ8 (GLOVE) ×2
GOWN STRL REUS W/ TWL LRG LVL3 (GOWN DISPOSABLE) ×2 IMPLANT
GOWN STRL REUS W/TWL 2XL LVL3 (GOWN DISPOSABLE) ×2 IMPLANT
GOWN STRL REUS W/TWL LRG LVL3 (GOWN DISPOSABLE) ×2
HEMOSTAT SNOW SURGICEL 2X4 (HEMOSTASIS) ×2 IMPLANT
HEMOSTAT SPONGE AVITENE ULTRA (HEMOSTASIS) IMPLANT
KIT BASIN OR (CUSTOM PROCEDURE TRAY) ×2 IMPLANT
KIT TURNOVER KIT B (KITS) ×2 IMPLANT
NS IRRIG 1000ML POUR BTL (IV SOLUTION) ×2 IMPLANT
PACK CV ACCESS (CUSTOM PROCEDURE TRAY) ×2 IMPLANT
PAD ARMBOARD 7.5X6 YLW CONV (MISCELLANEOUS) ×4 IMPLANT
SPONGE T-LAP 18X18 ~~LOC~~+RFID (SPONGE) ×2 IMPLANT
SUT MNCRL AB 4-0 PS2 18 (SUTURE) ×2 IMPLANT
SUT PROLENE 6 0 BV (SUTURE) ×2 IMPLANT
SUT PROLENE 7 0 BV 1 (SUTURE) IMPLANT
SUT SILK 2 0 SH (SUTURE) ×2 IMPLANT
SUT VIC AB 3-0 SH 27 (SUTURE) ×1
SUT VIC AB 3-0 SH 27X BRD (SUTURE) ×1 IMPLANT
TOWEL GREEN STERILE (TOWEL DISPOSABLE) ×2 IMPLANT
UNDERPAD 30X36 HEAVY ABSORB (UNDERPADS AND DIAPERS) ×2 IMPLANT
WATER STERILE IRR 1000ML POUR (IV SOLUTION) ×2 IMPLANT

## 2021-04-01 NOTE — Op Note (Signed)
    NAME: Antonio Keller    MRN: [id number] DOB: Oct 20, 1960    DATE OF OPERATION: 04/01/2021  PREOP DIAGNOSIS:    End-stage renal disease  POSTOP DIAGNOSIS:    End-stage renal disease  PROCEDURE:    Left-sided brachiocephalic fistula  SURGEON: Broadus John  ASSIST: Leontine Locket, PA  ANESTHESIA: General  EBL: 70ml  INDICATIONS:    Antonio Keller is a right handed 59 y.o. male  who presents with end stage renal disease.  His recent labs were reviewed demonstrating improvement.   Based on vein mapping and examination, this patient's best permanent access option is: Left brachiocephalic. I had an extensive discussion with this patient in regards to the nature of access surgery, including risk, benefits, and alternatives.   The patient is aware that the risks of access surgery include but are not limited to: bleeding, infection, steal syndrome, nerve damage, ischemic monomelic neuropathy, failure of access to mature, complications related to venous hypertension, and possible need for additional access procedures in the future. The patient has agreed to proceed with the above.  FINDINGS:   Left brachiocephalic fistula.  Left cephalic vein sclerotic, 5 mm in size  TECHNIQUE:   The patient was brought to the operating room and placed in supine position. The left arm was prepped and draped in a standard fashion. IV antibiotics were prior to incision. A timeout was performed.  A transverse incision was made above the elbow creese in the antecubital fossa. The  cephalic vein was isolated for 3 cm in length and ligated distally with a 2-0 silk stick-tie. The vein was flushed with heparin saline. The bicipital aponeurosis was partially released and the brachial artery freed from its paired brachial veins and secured with a vessel loop. The vein was juxtaposed to the brachial artery. The patient was heparinized. Vascular clamps were placed proximally and distally on the brachial artery  and a 5 mm arteriotomy was created on the brachial artery. This was flushed with heparin saline.  An anastomosis was created in end to side fashion on the brachial artery using running 6-0 Prolene suture.  Prior to completing the anastomsis, the vessels were flushed and the suture line was tied down. There was an excellent thrill in the cephalic vein from the anastomosis to the mid upper bicipital region. The patient had a multiphasic radial and ulnar signal. He had an excellent doppler signal in the fistula. The incision was irrigated and hemostasis achieved with cautery and suture. The deeper tissue was closed with 3-0 Vicryl and the skin closed with 4-0 Monocryl.  Dermabond was applied to the incisions. The patient was transferred to PACU in stable condition.   Given the complexity of the case a first assistant was necessary in order to expedient the procedure and safely perform the technical aspects of the operation.  Macie Burows, MD [facility]  DATE OF DICTATION:   04/01/2021

## 2021-04-01 NOTE — Transfer of Care (Signed)
Immediate Anesthesia Transfer of Care Note  Patient: Antonio Keller  Procedure(s) Performed: LEFT ARM ARTERIOVENOUS (AV) FISTULA CREATION (Left: Arm Lower)  Patient Location: PACU  Anesthesia Type:General  Level of Consciousness: awake, alert , oriented and patient cooperative  Airway & Oxygen Therapy: Patient Spontanous Breathing  Post-op Assessment: Report given to RN and Post -op Vital signs reviewed and stable  Post vital signs: Reviewed and stable  Last Vitals:  Vitals Value Taken Time  BP 132/78 04/01/21 0858  Temp    Pulse 78 04/01/21 0900  Resp 17 04/01/21 0900  SpO2 99 % 04/01/21 0900  Vitals shown include unvalidated device data.  Last Pain:  Vitals:   04/01/21 0630  TempSrc:   PainSc: 0-No pain      Patients Stated Pain Goal: 0 (96/88/64 8472)  Complications: No notable events documented.

## 2021-04-01 NOTE — Discharge Instructions (Signed)
   [facility]  Discharge Instructions  AV Fistula or Graft Surgery for Dialysis Access  Please refer to the following instructions for your post-procedure care. Your surgeon or physician assistant will discuss any changes with you.  Activity  You may drive the day following your surgery, if you are comfortable and no longer taking prescription pain medication. Resume full activity as the soreness in your incision resolves.  Bathing/Showering  You may shower after you go home. Keep your incision dry for 48 hours. Do not soak in a bathtub, hot tub, or swim until the incision heals completely. You may not shower if you have a hemodialysis catheter.  Incision Care  Clean your incision with mild soap and water after 48 hours. Pat the area dry with a clean towel. You do not need a bandage unless otherwise instructed. Do not apply any ointments or creams to your incision. You may have skin glue on your incision. Do not peel it off. It will come off on its own in about one week. Your arm may swell a bit after surgery. To reduce swelling use pillows to elevate your arm so it is above your heart. Your doctor will tell you if you need to lightly wrap your arm with an ACE bandage.  Diet  Resume your normal diet. There are not special food restrictions following this procedure. In order to heal from your surgery, it is CRITICAL to get adequate nutrition. Your body requires vitamins, minerals, and protein. Vegetables are the best source of vitamins and minerals. Vegetables also provide the perfect balance of protein. Processed food has little nutritional value, so try to avoid this.  Medications  Resume taking all of your medications. If your incision is causing pain, you may take over-the counter pain relievers such as acetaminophen (Tylenol). If you were prescribed a stronger pain medication, please be aware these medications can cause nausea and constipation. Prevent  nausea by taking the medication with a snack or meal. Avoid constipation by drinking plenty of fluids and eating foods with high amount of fiber, such as fruits, vegetables, and grains.  Do not take Tylenol if you are taking prescription pain medications.  Follow up Your surgeon may want to see you in the office following your access surgery. If so, this will be arranged at the time of your surgery.  Please call us immediately for any of the following conditions:  Increased pain, redness, drainage (pus) from your incision site Fever of 101 degrees or higher Severe or worsening pain at your incision site Hand pain or numbness.  Reduce your risk of vascular disease:  Stop smoking. If you would like help, call [facility] at 1-800-QUIT-NOW 425-258-1317) or [place] at [place] your cholesterol Maintain a desired weight Control your diabetes Keep your blood pressure down  Dialysis  It will take several weeks to several months for your new dialysis access to be ready for use. Your surgeon will determine when it is okay to use it. Your nephrologist will continue to direct your dialysis. You can continue to use your Permcath until your new access is ready for use.   04/01/2021 Antonio Keller [id number] 1961/03/25  Surgeon(s): Broadus John, MD  Procedure(s): Creation left brachiocephalic AV fistula  x Do not stick fistula for 12 weeks    If you have any questions, please call the office at [phone].

## 2021-04-01 NOTE — H&P (Signed)
H&P Note   Patient seen and examined this morning in preoperative holding. He was last seen on Friday with end-stage renal disease after vein mapping for permanent HD access Patient has sufficient vein for left-sided brachiocephalic fistula No change in medical history After discussing risk and benefits of surgery, Antonio Keller elected to proceed.  CC:  ESRD Requesting Provider:  No ref. provider found  HPI: Antonio Keller is a 60 year old right-handed male who presents to clinic today to discuss permanent HD access.  He was noted to have end-stage renal disease during a recent hospitalization and also found to have severe systolic heart failure, anemia, thrombocytopenia.   He is currently being dialyzed through a right IJ tunneled line MWF.  No previous access procedures.  On exam, Antonio Keller was doing well with no complaints.  He was accompanied by his wife.  The pt is on a statin for cholesterol management.  The pt is not on a daily aspirin.   Other AC:  none The pt is on multiple medications for hypertension.   The pt is not diabetic.   Tobacco hx:  none  Past Medical History:  Diagnosis Date   A-fib ([place])    Anemia    Gout    Hepatitis B    learned of diagnosis 01/2021   History of blood transfusion 07/2020   [facility]   Hypertension    Renal disorder    dialysis M-W-F, stage 5   Systolic heart failure (HCC)    Thrombocytopenia (HCC)     Past Surgical History:  Procedure Laterality Date   COLONOSCOPY     IR FLUORO GUIDE CV LINE RIGHT  02/15/2021   IR US GUIDE VASC ACCESS RIGHT  02/15/2021    Social History   Socioeconomic History   Marital status: Married    Spouse name: Not on file   Number of children: Not on file   Years of education: Not on file   Highest education level: Not on file  Occupational History   Not on file  Tobacco Use   Smoking status: Never   Smokeless tobacco: Never  Vaping Use   Vaping Use: Never used  Substance and Sexual Activity   Alcohol use:  Not Currently   Drug use: Never   Sexual activity: Yes    Partners: Female  Other Topics Concern   Not on file  Social History Narrative   Not on file   Social Determinants of Health   Financial Resource Strain: Not on file  Food Insecurity: Not on file  Transportation Needs: Not on file  Physical Activity: Not on file  Stress: Not on file  Social Connections: Not on file  Intimate Partner Violence: Not on file   History reviewed. No pertinent family history.  Current Facility-Administered Medications  Medication Dose Route Frequency Provider Last Rate Last Admin   0.9 %  sodium chloride infusion   Intravenous Continuous Broadus John, MD       0.9 %  sodium chloride infusion   Intravenous Continuous Hodierne, Adam, MD       0.9 % irrigation (POUR BTL)    PRN Broadus John, MD   1,000 mL at 04/01/21 0717   ceFAZolin (ANCEF) IVPB 2g/100 mL premix  2 g Intravenous 30 min Pre-Op Broadus John, MD       chlorhexidine (HIBICLENS) 4 % liquid 4 application  60 mL Topical Once Broadus John, MD       And   [START ON 04/02/2021]  chlorhexidine (HIBICLENS) 4 % liquid 4 application  60 mL Topical Once Broadus John, MD       heparin 6000 units / NS 500 mL irrigation    PRN Broadus John, MD   1 application at 26/83/41 [phone]    No Known Allergies   REVIEW OF SYSTEMS:   [X]  denotes positive finding, [ ]  denotes negative finding Cardiac  Comments:  Chest pain or chest pressure:    Shortness of breath upon exertion:    Short of breath when lying flat:    Irregular heart rhythm:        Vascular    Pain in calf, thigh, or hip brought on by ambulation:    Pain in feet at night that wakes you up from your sleep:     Blood clot in your veins:    Leg swelling:         Pulmonary    Oxygen at home:    Productive cough:     Wheezing:         Neurologic    Sudden weakness in arms or legs:     Sudden numbness in arms or legs:     Sudden onset of difficulty speaking or  slurred speech:    Temporary loss of vision in one eye:     Problems with dizziness:         Gastrointestinal    Blood in stool:     Vomited blood:         Genitourinary    Burning when urinating:     Blood in urine:        Psychiatric    Major depression:         Hematologic    Bleeding problems:    Problems with blood clotting too easily:        Skin    Rashes or ulcers:        Constitutional    Fever or chills:      PHYSICAL EXAMINATION:  Vitals:   03/31/21 1643 04/01/21 0555  BP:  (!) 166/103  Pulse:  65  Resp:  17  Temp:  98.4 F (36.9 C)  TempSrc:  Oral  SpO2:  100%  Weight: 75.8 kg 76 kg  Height: 6' (1.829 m) 6' (1.829 m)     General:  WDWN in NAD; vital signs documented above Gait: Not observed HENT: WNL, normocephalic Pulmonary: normal non-labored breathing , without Rales, rhonchi,  wheezing Cardiac: irregular HR, with Murmurs  Abdomen: soft, NT, no masses Skin: with rashes Vascular Exam/Pulses:  Right Left  Radial 2+ (normal) 2+ (normal)  Ulnar 2+ (normal) 2+ (normal)                   Extremities: without ischemic changes, without Gangrene , without cellulitis; without open wounds;  Musculoskeletal: no muscle wasting or atrophy  Neurologic: A&O X 3;  No focal weakness or paresthesias are detected Psychiatric:  The pt has Normal affect.   Non-Invasive Vascular Imaging:    Bilateral lower extremity vein mapping demonstrates adequate cephalic vein for left arm brachiocephalic fistula.    ASSESSMENT/PLAN:  Antonio Keller is a right-handed 60 y.o. male who presents with end stage renal disease.  His recent labs were reviewed demonstrating improvement.  Based on vein mapping and examination, this patient's best permanent access option is: Left brachiocephalic. I had an extensive discussion with this patient in regards to the nature of access surgery, including risk, benefits, and alternatives.  The patient is aware that the risks of  access surgery include but are not limited to: bleeding, infection, steal syndrome, nerve damage, ischemic monomelic neuropathy, failure of access to mature, complications related to venous hypertension, and possible need for additional access procedures in the future. The patient has agreed to proceed with the above.   Broadus John, MD [facility] [phone]

## 2021-04-01 NOTE — Anesthesia Procedure Notes (Signed)
Procedure Name: LMA Insertion Date/Time: 04/01/2021 7:30 AM Performed by: Minerva Ends, CRNA Pre-anesthesia Checklist: Patient identified, Emergency Drugs available, Suction available and Patient being monitored Patient Re-evaluated:Patient Re-evaluated prior to induction Oxygen Delivery Method: Circle system utilized Preoxygenation: Pre-oxygenation with 100% oxygen Induction Type: IV induction LMA: LMA inserted LMA Size: 4.0 Tube type: Oral Placement Confirmation: positive ETCO2 and breath sounds checked- equal and bilateral Tube secured with: Tape Dental Injury: Teeth and Oropharynx as per pre-operative assessment

## 2021-04-01 NOTE — Anesthesia Postprocedure Evaluation (Signed)
Anesthesia Post Note  Patient: Antonio Keller  Procedure(s) Performed: LEFT ARM ARTERIOVENOUS (AV) FISTULA CREATION (Left: Arm Lower)     Patient location during evaluation: PACU Anesthesia Type: General Level of consciousness: awake and alert Pain management: pain level controlled Vital Signs Assessment: post-procedure vital signs reviewed and stable Respiratory status: spontaneous breathing, nonlabored ventilation, respiratory function stable and patient connected to nasal cannula oxygen Cardiovascular status: blood pressure returned to baseline and stable Postop Assessment: no apparent nausea or vomiting Anesthetic complications: no   No notable events documented.  Last Vitals:  Vitals:   04/01/21 0913 04/01/21 0928  BP: 115/84 121/85  Pulse: 77 73  Resp: 13 17  Temp:    SpO2: 99% 98%    Last Pain:  Vitals:   04/01/21 0858  TempSrc:   PainSc: [place]

## 2021-04-02 ENCOUNTER — Other Ambulatory Visit: Payer: Self-pay | Admitting: *Deleted

## 2021-04-02 ENCOUNTER — Encounter (HOSPITAL_COMMUNITY): Payer: Self-pay | Admitting: Vascular Surgery

## 2021-04-02 DIAGNOSIS — N185 Chronic kidney disease, stage 5: Secondary | ICD-10-CM

## 2021-04-16 ENCOUNTER — Telehealth: Payer: Self-pay | Admitting: Internal Medicine

## 2021-04-16 NOTE — Telephone Encounter (Signed)
R/s per prov ok, pt wife ok and awaree

## 2021-05-12 NOTE — Progress Notes (Signed)
POST OPERATIVE OFFICE NOTE    CC:  F/u for surgery  HPI:  This is a 60 y.o. male who is s/p left BC AVF on 04/01/2021 by Dr. Virl Cagey. He is currently dialyzing via [facility] that was placed by IR in September 2022.    Pt states he does not have pain/numbness in the left hand.    The pt is on dialysis is at the [place] location M/W/F  Pt has hx of severe systolic heart failure, anemia and thrombocytopenia.    No Known Allergies  Current Outpatient Medications  Medication Sig Dispense Refill   allopurinol (ZYLOPRIM) 100 MG tablet Take 0.5 tablets (50 mg total) by mouth daily. 30 tablet 1   amiodarone (PACERONE) 200 MG tablet Take 2 tablets (400 mg total) by mouth daily for 7 days, THEN 1 tablet (200 mg total) daily. (Patient taking differently: 1 tablet (200 mg total) daily.) 97 tablet 0   atorvastatin (LIPITOR) 40 MG tablet Take 1 tablet (40 mg total) by mouth daily. 90 tablet 1   calcitRIOL (ROCALTROL) 0.25 MCG capsule Take 1 capsule (0.25 mcg total) by mouth daily. 90 capsule 1   calcium acetate (PHOSLO) 667 MG capsule Take 2 capsules (1,334 mg total) by mouth 3 (three) times daily with meals. 270 capsule 1   calcium carbonate (OS-CAL - DOSED IN MG OF ELEMENTAL CALCIUM) 1250 (500 Ca) MG tablet Take 1 tablet (500 mg of elemental calcium total) by mouth 3 (three) times daily with meals. 270 tablet 1   carvedilol (COREG) 25 MG tablet Take 25 mg by mouth 2 (two) times daily.     chlorthalidone (HYGROTON) 25 MG tablet Take 25 mg by mouth daily.     colchicine 0.6 MG tablet Take 1 tablet (0.6 mg total) by mouth daily. (Patient taking differently: Take 0.6 mg by mouth daily as needed (Gout).) 30 tablet 1   ferrous sulfate 325 (65 FE) MG tablet Take 1 tablet (325 mg total) by mouth daily. 90 tablet 3   hydrALAZINE (APRESOLINE) 50 MG tablet Take 50 mg by mouth 2 (two) times daily.     isosorbide mononitrate (IMDUR) 30 MG 24 hr tablet Take 1 tablet (30 mg total) by mouth daily. 90 tablet 1    oxyCODONE-acetaminophen (PERCOCET) 5-325 MG tablet Take 1 tablet by mouth every 6 (six) hours as needed for severe pain. 8 tablet 0   senna-docusate (SENOKOT-S) 8.6-50 MG tablet Take 1 tablet by mouth 2 (two) times daily between meals as needed for mild constipation. (Patient not taking: No sig reported) 180 tablet 0   Vitamin D3 (VITAMIN D) 25 MCG tablet Take 1 tablet (1,000 Units total) by mouth daily. 30 tablet 0   No current facility-administered medications for this visit.     ROS:  See HPI  Physical Exam:  Today's Vitals   05/16/21 0943  BP: (!) 154/95  Pulse: 67  Resp: 20  Temp: 97.8 F (36.6 C)  TempSrc: Temporal  SpO2: 99%  Weight: 167 lb 11.2 oz (76.1 kg)  Height: 6' (1.829 m)   Body mass index is 22.74 kg/m.   Incision:  healed nicely Extremities:   There is a palpable left radial pulse.  Motor and sensory are in tact left hand.  Motor and sensory are in tact.   There is a thrill/bruit present.  The fistula is easily palpable but feels small proximally   Dialysis Duplex on 05/16/2021: Diameter:  0.31-0.66cm Depth:  0.20-0.76cm Patent BCAVF.  Pulsatile inflow.  Very low flow volume.  Low anastomotic flow.  Elevated velocities throughout the outflow vein   Assessment/Plan:  This is a 60 y.o. male who is s/p: left BC AVF on 04/01/2021 by Dr. Virl Cagey. He is currently dialyzing via [facility] that was placed by IR in September 2022.    -the pt does not have evidence of steal and motor and sensory are in tact.  -the fistula is not maturing proximally and there are elevated velocities throughout the fistula.  Will schedule him for a fistulogram with possible intervention.   -discussed with pt and his wife that access does not last forever and will need intervention or even new access at some point.  They expressed understanding.    Leontine Locket, [facility] [phone]  Clinic MD:  Virl Cagey

## 2021-05-16 ENCOUNTER — Other Ambulatory Visit: Payer: Self-pay

## 2021-05-16 ENCOUNTER — Ambulatory Visit (HOSPITAL_COMMUNITY)
Admission: RE | Admit: 2021-05-16 | Discharge: 2021-05-16 | Disposition: A | Discharge status: Home / Self Care | Payer: BC Managed Care – PPO | Source: Ambulatory Visit | Attending: Physician Assistant | Admitting: Physician Assistant

## 2021-05-16 ENCOUNTER — Ambulatory Visit (INDEPENDENT_AMBULATORY_CARE_PROVIDER_SITE_OTHER): Payer: BC Managed Care – PPO | Admitting: Physician Assistant

## 2021-05-16 VITALS — BP 154/95 | HR 67 | Temp 97.8°F | Resp 20 | Ht 72.0 in | Wt 167.7 lb

## 2021-05-16 DIAGNOSIS — N185 Chronic kidney disease, stage 5: Secondary | ICD-10-CM | POA: Insufficient documentation

## 2021-05-16 DIAGNOSIS — Z992 Dependence on renal dialysis: Secondary | ICD-10-CM

## 2021-05-16 DIAGNOSIS — N186 End stage renal disease: Secondary | ICD-10-CM

## 2021-05-20 ENCOUNTER — Inpatient Hospital Stay: Payer: BC Managed Care – PPO | Attending: Internal Medicine

## 2021-05-20 ENCOUNTER — Encounter: Payer: Self-pay | Admitting: Internal Medicine

## 2021-05-20 ENCOUNTER — Other Ambulatory Visit: Payer: Self-pay

## 2021-05-20 ENCOUNTER — Inpatient Hospital Stay: Payer: BC Managed Care – PPO | Admitting: Internal Medicine

## 2021-05-20 VITALS — BP 142/97 | HR 69 | Temp 97.6°F | Resp 17 | Ht 72.0 in | Wt 167.5 lb

## 2021-05-20 DIAGNOSIS — D638 Anemia in other chronic diseases classified elsewhere: Secondary | ICD-10-CM

## 2021-05-20 DIAGNOSIS — I4891 Unspecified atrial fibrillation: Secondary | ICD-10-CM | POA: Insufficient documentation

## 2021-05-20 DIAGNOSIS — D61818 Other pancytopenia: Secondary | ICD-10-CM | POA: Diagnosis present

## 2021-05-20 DIAGNOSIS — D631 Anemia in chronic kidney disease: Secondary | ICD-10-CM | POA: Insufficient documentation

## 2021-05-20 DIAGNOSIS — N186 End stage renal disease: Secondary | ICD-10-CM | POA: Diagnosis not present

## 2021-05-20 DIAGNOSIS — D696 Thrombocytopenia, unspecified: Secondary | ICD-10-CM | POA: Diagnosis not present

## 2021-05-20 DIAGNOSIS — Z992 Dependence on renal dialysis: Secondary | ICD-10-CM | POA: Diagnosis not present

## 2021-05-20 DIAGNOSIS — N189 Chronic kidney disease, unspecified: Secondary | ICD-10-CM

## 2021-05-20 DIAGNOSIS — I132 Hypertensive heart and chronic kidney disease with heart failure and with stage 5 chronic kidney disease, or end stage renal disease: Secondary | ICD-10-CM | POA: Insufficient documentation

## 2021-05-20 DIAGNOSIS — I5020 Unspecified systolic (congestive) heart failure: Secondary | ICD-10-CM | POA: Insufficient documentation

## 2021-05-20 DIAGNOSIS — Z79899 Other long term (current) drug therapy: Secondary | ICD-10-CM | POA: Insufficient documentation

## 2021-05-20 DIAGNOSIS — R5383 Other fatigue: Secondary | ICD-10-CM | POA: Diagnosis not present

## 2021-05-20 LAB — CBC WITH DIFFERENTIAL (CANCER CENTER ONLY)
Abs Immature Granulocytes: 0 10*3/uL (ref 0.00–0.07)
Basophils Absolute: 0 10*3/uL (ref 0.0–0.1)
Basophils Relative: 1 %
Eosinophils Absolute: 0.1 10*3/uL (ref 0.0–0.5)
Eosinophils Relative: 1 %
HCT: 32.8 % — ABNORMAL LOW (ref 39.0–52.0)
Hemoglobin: 10.8 g/dL — ABNORMAL LOW (ref 13.0–17.0)
Immature Granulocytes: 0 %
Lymphocytes Relative: 37 %
Lymphs Abs: 1.6 10*3/uL (ref 0.7–4.0)
MCH: 30.6 pg (ref 26.0–34.0)
MCHC: 32.9 g/dL (ref 30.0–36.0)
MCV: 92.9 fL (ref 80.0–100.0)
Monocytes Absolute: 0.5 10*3/uL (ref 0.1–1.0)
Monocytes Relative: 12 %
Neutro Abs: 2.2 10*3/uL (ref 1.7–7.7)
Neutrophils Relative %: 49 %
Platelet Count: 117 10*3/uL — ABNORMAL LOW (ref 150–400)
RBC: 3.53 MIL/uL — ABNORMAL LOW (ref 4.22–5.81)
RDW: 14.9 % (ref 11.5–15.5)
WBC Count: 4.4 10*3/uL (ref 4.0–10.5)
nRBC: 0 % (ref 0.0–0.2)

## 2021-05-20 LAB — CMP (CANCER CENTER ONLY)
ALT: 7 U/L (ref 0–44)
AST: 11 U/L — ABNORMAL LOW (ref 15–41)
Albumin: 4.4 g/dL (ref 3.5–5.0)
Alkaline Phosphatase: 121 U/L (ref 38–126)
Anion gap: 9 (ref 5–15)
BUN: 38 mg/dL — ABNORMAL HIGH (ref 6–20)
CO2: 32 mmol/L (ref 22–32)
Calcium: 9.5 mg/dL (ref 8.9–10.3)
Chloride: 98 mmol/L (ref 98–111)
Creatinine: 11.85 mg/dL (ref 0.61–1.24)
GFR, Estimated: 4 mL/min — ABNORMAL LOW (ref >60)
Glucose, Bld: 88 mg/dL (ref 70–99)
Potassium: 4.5 mmol/L (ref 3.5–5.1)
Sodium: 139 mmol/L (ref 135–145)
Total Bilirubin: 0.5 mg/dL (ref 0.3–1.2)
Total Protein: 7.5 g/dL (ref 6.5–8.1)

## 2021-05-20 LAB — LACTATE DEHYDROGENASE: LDH: 128 U/L (ref 98–192)

## 2021-05-20 NOTE — Progress Notes (Signed)
Antonio Keller [phone]   [phone]  OFFICE PROGRESS NOTE  Antonio Croak, MD [place] [id number]  DIAGNOSIS: Anemia and thrombocytopenia likely secondary to end-stage renal disease.  PRIOR THERAPY: None  CURRENT THERAPY: None  INTERVAL HISTORY: Antonio Keller 60 y.o. male returns to the clinic today for follow-up visit accompanied by his wife.  The patient is feeling fine today with no concerning complaints except for mild fatigue.  He is currently on dialysis for end-stage renal disease on Monday, Wednesday and Friday.  He denied having any current chest pain, shortness of breath, cough or hemoptysis.  He denied having any fever or chills.  He has no nausea, vomiting, diarrhea or constipation.  He lost few pounds since his last visit but this is because of the fluid removal during dialysis.  The patient is here today for evaluation and repeat blood work.  MEDICAL HISTORY: Past Medical History:  Diagnosis Date   A-fib ([place])    Anemia    Gout    Hepatitis B    learned of diagnosis 01/2021   History of blood transfusion 07/2020   [facility]   Hypertension    Renal disorder    dialysis M-W-F, stage 5   Systolic heart failure (HCC)    Thrombocytopenia (HCC)     ALLERGIES:  has No Known Allergies.  MEDICATIONS:  Current Outpatient Medications  Medication Sig Dispense Refill   allopurinol (ZYLOPRIM) 100 MG tablet Take 0.5 tablets (50 mg total) by mouth daily. (Patient taking differently: Take 100 mg by mouth daily.) 30 tablet 1   amiodarone (PACERONE) 200 MG tablet Take 2 tablets (400 mg total) by mouth daily for 7 days, THEN 1 tablet (200 mg total) daily. (Patient taking differently: 1 tablet (200 mg total) daily.) 97 tablet 0   atorvastatin (LIPITOR) 40 MG tablet Take 1 tablet (40 mg total) by mouth daily. 90 tablet 1   calcitRIOL (ROCALTROL) 0.25 MCG capsule Take 1 capsule (0.25 mcg total) by mouth daily. 90  capsule 1   carvedilol (COREG) 25 MG tablet Take 25 mg by mouth 2 (two) times daily.     chlorthalidone (HYGROTON) 25 MG tablet Take 25 mg by mouth daily.     ferrous sulfate 325 (65 FE) MG tablet Take 1 tablet (325 mg total) by mouth daily. 90 tablet 3   hydrALAZINE (APRESOLINE) 50 MG tablet Take 50 mg by mouth 2 (two) times daily.     isosorbide mononitrate (IMDUR) 30 MG 24 hr tablet Take 1 tablet (30 mg total) by mouth daily. 90 tablet 1   oxyCODONE-acetaminophen (PERCOCET) 5-325 MG tablet Take 1 tablet by mouth every 6 (six) hours as needed for severe pain. 8 tablet 0   Vitamin D3 (VITAMIN D) 25 MCG tablet Take 1 tablet (1,000 Units total) by mouth daily. (Patient taking differently: Take 1,000 Units by mouth every Monday, Wednesday, and Friday.) 30 tablet 0   calcium acetate (PHOSLO) 667 MG capsule Take 2 capsules (1,334 mg total) by mouth 3 (three) times daily with meals. (Patient not taking: Reported on 05/20/2021) 270 capsule 1   calcium carbonate (OS-CAL - DOSED IN MG OF ELEMENTAL CALCIUM) 1250 (500 Ca) MG tablet Take 1 tablet (500 mg of elemental calcium total) by mouth 3 (three) times daily with meals. (Patient not taking: Reported on 05/19/2021) 270 tablet 1   colchicine 0.6 MG tablet Take 1 tablet (0.6 mg total) by mouth daily. (  Patient not taking: Reported on 05/19/2021) 30 tablet 1   senna-docusate (SENOKOT-S) 8.6-50 MG tablet Take 1 tablet by mouth 2 (two) times daily between meals as needed for mild constipation. (Patient not taking: Reported on 05/19/2021) 180 tablet 0   No current facility-administered medications for this visit.    SURGICAL HISTORY:  Past Surgical History:  Procedure Laterality Date   AV FISTULA PLACEMENT Left 04/01/2021   Procedure: LEFT ARM ARTERIOVENOUS (AV) FISTULA CREATION;  Surgeon: Broadus John, MD;  Location: [place];  Service: Vascular;  Laterality: Left;   COLONOSCOPY     IR FLUORO GUIDE CV LINE RIGHT  02/15/2021   IR US GUIDE VASC ACCESS RIGHT   02/15/2021    REVIEW OF SYSTEMS:  A comprehensive review of systems was negative except for: Constitutional: positive for fatigue   PHYSICAL EXAMINATION: General appearance: alert, cooperative, fatigued, and no distress Head: Normocephalic, without obvious abnormality, atraumatic Neck: no adenopathy, no JVD, supple, symmetrical, trachea midline, and thyroid not enlarged, symmetric, no tenderness/mass/nodules Lymph nodes: Cervical, supraclavicular, and axillary nodes normal. Resp: clear to auscultation bilaterally Back: symmetric, no curvature. ROM normal. No CVA tenderness. Cardio: regular rate and rhythm, S1, S2 normal, no murmur, click, rub or gallop GI: soft, non-tender; bowel sounds normal; no masses,  no organomegaly Extremities: extremities normal, atraumatic, no cyanosis or edema  ECOG PERFORMANCE STATUS: 1 - Symptomatic but completely ambulatory  Blood pressure (!) 142/97, pulse 69, temperature 97.6 F (36.4 C), temperature source Temporal, resp. rate 17, height 6' (1.829 m), weight 167 lb 8 oz (76 kg), SpO2 100 %.  LABORATORY DATA: Lab Results  Component Value Date   WBC 4.4 05/20/2021   HGB 10.8 (L) 05/20/2021   HCT 32.8 (L) 05/20/2021   MCV 92.9 05/20/2021   PLT 117 (L) 05/20/2021      Chemistry      Component Value Date/Time   NA 139 05/20/2021 0813   NA 139 03/14/2021 1201   K 4.5 05/20/2021 0813   CL 98 05/20/2021 0813   CO2 32 05/20/2021 0813   BUN 38 (H) 05/20/2021 0813   BUN 35 (H) 03/14/2021 1201   CREATININE 11.85 (HH) 05/20/2021 0813      Component Value Date/Time   CALCIUM 9.5 05/20/2021 0813   CALCIUM 6.4 (LL) 02/17/2021 0008   ALKPHOS 121 05/20/2021 0813   AST 11 (L) 05/20/2021 0813   ALT 7 05/20/2021 0813   BILITOT 0.5 05/20/2021 0813       RADIOGRAPHIC STUDIES: VAS US DUPLEX DIALYSIS ACCESS (AVF, AVG)  Result Date: 05/16/2021 DIALYSIS ACCESS Patient Name:  Antonio Keller  Date of Exam:   05/16/2021 Medical Rec #: [id number]       Accession #:    [id number] Date of Birth: 02-08-1961      Patient Gender: M Patient Age:   20 years Exam Location:  [facility] Procedure:      VAS US DUPLEX DIALYSIS ACCESS (AVF, AVG) Referring Phys: Antonio Keller --------------------------------------------------------------------------------  Reason for Exam: Routine follow up. Access Site: Left Upper Extremity. Access Type: Basilic vein transposition. History: Created on 04/01/21. Performing Technologist: Ralene Cork RVT  Examination Guidelines: A complete evaluation includes B-mode imaging, spectral Doppler, color Doppler, and power Doppler as needed of all accessible portions of each vessel. Unilateral testing is considered an integral part of a complete examination. Limited examinations for reoccurring indications may be performed as noted.  Findings: +--------------------+----------+-----------------+---------+  AVF  PSV (cm/s) Flow Vol (mL/min) Comments   +--------------------+----------+-----------------+---------+  Native artery inflow     39            93         pulsatile  +--------------------+----------+-----------------+---------+  AVF Anastomosis          70                                  +--------------------+----------+-----------------+---------+  +------------+----------+-------------+----------+---------------+  OUTFLOW VEIN PSV (cm/s) Diameter (cm) Depth (cm)    Describe      +------------+----------+-------------+----------+---------------+  Prox UA         454         0.31         0.20    branch 0.265 cm  +------------+----------+-------------+----------+---------------+  Mid UA          332         0.36         0.50                     +------------+----------+-------------+----------+---------------+  Dist UA         232         0.55         0.40                     +------------+----------+-------------+----------+---------------+  AC Fossa        362         0.66         0.76                      +------------+----------+-------------+----------+---------------+  Summary: Patent BCAVF. Pulsatile inflow. Very low flow volume. Low anastomotic flow. Elevated velocities throughout the outflow vein.  *See table(s) above for measurements and observations.  Diagnosing physician: Orlie Pollen Electronically signed by Orlie Pollen on 05/16/2021 at 12:22:03 PM.    --------------------------------------------------------------------------------   Final     ASSESSMENT AND PLAN: This is a very pleasant 61 years old African male with pancytopenia presented mainly with significant anemia of chronic kidney disease as well as thrombocytopenia. I think these are related to his chronic kidney disease as well as the hepatitis B.  I do not see any urgent need to proceed with a bone marrow biopsy and aspirate at this point. Repeat CBC today showed improvement of his hemoglobin and hematocrit as well as the platelets. I recommended for the patient to continue on observation and dialysis as recommended by nephrology. He would definitely benefit from treatment with ESA during his dialysis. I will see him back for follow-up visit in 6 months for evaluation and repeat CBC, iron study and ferritin. For the hypertension the patient was advised to take his blood pressure medication as prescribed and to monitor it closely at home. The patient was advised to call immediately if he has any other concerning symptoms in the interval. The patient voices understanding of current disease status and treatment options and is in agreement with the current care plan.  All questions were answered. The patient knows to call the clinic with any problems, questions or concerns. We can certainly see the patient much sooner if necessary.   Disclaimer: This note was dictated with voice recognition software. Similar sounding words can inadvertently be transcribed and may not be corrected upon review.

## 2021-05-20 NOTE — Progress Notes (Signed)
CRITICAL VALUE STICKER  CRITICAL VALUE: Creatinine 11.85  RECEIVER (on-site recipient of call): Laurence Compton, RN  [place] NOTIFIED:  12/20 at 1000  MESSENGER (representative from lab):  MD NOTIFIED: 12/20 at [place]: 0900  RESPONSE:  Creatinine is always high.  MD aware.

## 2021-05-21 ENCOUNTER — Ambulatory Visit (HOSPITAL_COMMUNITY)
Admission: RE | Admit: 2021-05-21 | Discharge: 2021-05-21 | Disposition: A | Discharge status: Home / Self Care | Payer: BC Managed Care – PPO | Attending: Vascular Surgery | Admitting: Vascular Surgery

## 2021-05-21 ENCOUNTER — Encounter (HOSPITAL_COMMUNITY)
Admission: RE | Disposition: A | Discharge status: Home / Self Care | Payer: Self-pay | Source: Home / Self Care | Attending: Vascular Surgery

## 2021-05-21 DIAGNOSIS — Z992 Dependence on renal dialysis: Secondary | ICD-10-CM | POA: Diagnosis not present

## 2021-05-21 DIAGNOSIS — Y832 Surgical operation with anastomosis, bypass or graft as the cause of abnormal reaction of the patient, or of later complication, without mention of misadventure at the time of the procedure: Secondary | ICD-10-CM | POA: Diagnosis not present

## 2021-05-21 DIAGNOSIS — D649 Anemia, unspecified: Secondary | ICD-10-CM | POA: Diagnosis not present

## 2021-05-21 DIAGNOSIS — D696 Thrombocytopenia, unspecified: Secondary | ICD-10-CM | POA: Insufficient documentation

## 2021-05-21 DIAGNOSIS — T82858A Stenosis of vascular prosthetic devices, implants and grafts, initial encounter: Secondary | ICD-10-CM | POA: Insufficient documentation

## 2021-05-21 DIAGNOSIS — N186 End stage renal disease: Secondary | ICD-10-CM | POA: Diagnosis not present

## 2021-05-21 DIAGNOSIS — I5022 Chronic systolic (congestive) heart failure: Secondary | ICD-10-CM | POA: Insufficient documentation

## 2021-05-21 HISTORY — PX: A/V FISTULAGRAM: CATH118298

## 2021-05-21 HISTORY — PX: PERIPHERAL VASCULAR BALLOON ANGIOPLASTY: CATH118281

## 2021-05-21 SURGERY — A/V FISTULAGRAM
Anesthesia: LOCAL | Laterality: Left

## 2021-05-21 MED ORDER — LIDOCAINE HCL (PF) 1 % IJ SOLN
INTRAMUSCULAR | Status: DC | PRN
  Administered 2021-05-21: 2 mL

## 2021-05-21 MED ORDER — FENTANYL CITRATE (PF) 100 MCG/2ML IJ SOLN
INTRAMUSCULAR | Status: DC | PRN
  Administered 2021-05-21: 50 ug via INTRAVENOUS

## 2021-05-21 MED ORDER — HEPARIN (PORCINE) IN NACL 1000-0.9 UT/500ML-% IV SOLN
INTRAVENOUS | Status: DC | PRN
  Administered 2021-05-21: 500 mL

## 2021-05-21 MED ORDER — LIDOCAINE HCL (PF) 1 % IJ SOLN
INTRAMUSCULAR | Status: AC
  Filled 2021-05-21: qty 30

## 2021-05-21 MED ORDER — SODIUM CHLORIDE 0.9 % IV SOLN: 250.0000 mL | INTRAVENOUS | Status: DC | PRN

## 2021-05-21 MED ORDER — SODIUM CHLORIDE 0.9% FLUSH: 3.0000 mL | Freq: Two times a day (BID) | INTRAVENOUS | Status: DC

## 2021-05-21 MED ORDER — SODIUM CHLORIDE 0.9% FLUSH: 3.0000 mL | INTRAVENOUS | Status: DC | PRN

## 2021-05-21 MED ORDER — IODIXANOL 320 MG/ML IV SOLN
INTRAVENOUS | Status: DC | PRN
  Administered 2021-05-21: 10:00:00 110 mL

## 2021-05-21 MED ORDER — FENTANYL CITRATE (PF) 100 MCG/2ML IJ SOLN
INTRAMUSCULAR | Status: AC
  Filled 2021-05-21: qty 2

## 2021-05-21 MED ORDER — MIDAZOLAM HCL 2 MG/2ML IJ SOLN
INTRAMUSCULAR | Status: AC
  Filled 2021-05-21: qty 2

## 2021-05-21 MED ORDER — MIDAZOLAM HCL 2 MG/2ML IJ SOLN
INTRAMUSCULAR | Status: DC | PRN
  Administered 2021-05-21: 1 mg via INTRAVENOUS

## 2021-05-21 SURGICAL SUPPLY — 25 items
BAG SNAP BAND KOVER 36X36 (MISCELLANEOUS) ×3 IMPLANT
BALLN ATLAS 14X40X75 (BALLOONS) ×3
BALLN MUSTANG 10X60X75 (BALLOONS) ×3
BALLN MUSTANG 5X60X75 (BALLOONS) ×3
BALLN MUSTANG 6X80X75 (BALLOONS) ×3
BALLOON ATLAS 14X40X75 (BALLOONS) IMPLANT
BALLOON MUSTANG 10X60X75 (BALLOONS) IMPLANT
BALLOON MUSTANG 5X60X75 (BALLOONS) IMPLANT
BALLOON MUSTANG 6X80X75 (BALLOONS) IMPLANT
CATH ANGIO 5F BER 100CM (CATHETERS) ×2 IMPLANT
CATH ANGIO 5F BER2 65CM (CATHETERS) ×2 IMPLANT
COVER DOME SNAP 22 D (MISCELLANEOUS) ×3 IMPLANT
GLIDEWIRE ADV .035X260CM (WIRE) ×2 IMPLANT
KIT ENCORE 26 ADVANTAGE (KITS) ×2 IMPLANT
PROTECTION STATION PRESSURIZED (MISCELLANEOUS) ×3
SHEATH PINNACLE R/O II 5F 6CM (SHEATH) ×2 IMPLANT
SHEATH PINNACLE R/O II 6F 4CM (SHEATH) ×2 IMPLANT
SHEATH PINNACLE R/O II 7F 4CM (SHEATH) ×2 IMPLANT
SHEATH PROBE COVER 6X72 (BAG) ×3 IMPLANT
STATION PROTECTION PRESSURIZED (MISCELLANEOUS) ×1 IMPLANT
STOPCOCK MORSE 400PSI 3WAY (MISCELLANEOUS) ×3 IMPLANT
TRAY PV CATH (CUSTOM PROCEDURE TRAY) ×3 IMPLANT
TUBING CIL FLEX 10 FLL-RA (TUBING) ×3 IMPLANT
WIRE BENTSON .035X145CM (WIRE) ×2 IMPLANT
WIRE MICRO SET SILHO 5FR 7 (SHEATH) ×2 IMPLANT

## 2021-05-21 NOTE — H&P (Signed)
°  Daily Progress Note  Patient seen and examined in preop holding.  No complaints. Poor fistula maturation. Created 04/01/21. Ultrasound demonstrates high velocities No changes to medication history or physical exam since last seen in clinic. After discussing the risks and benefits of left arm fistulagram, Antonio Keller elected to proceed.   Antonio John MD   POST OPERATIVE OFFICE NOTE    CC:  F/u for surgery  HPI:  This is a 60 y.o. male who is s/p left BC AVF on 04/01/2021 by Dr. Virl Cagey. He is currently dialyzing via [facility] that was placed by IR in September 2022.    Pt states he does not have pain/numbness in the left hand.    The pt is on dialysis is at the [place] location M/W/F  Pt has hx of severe systolic heart failure, anemia and thrombocytopenia.    No Known Allergies  Current Facility-Administered Medications  Medication Dose Route Frequency Provider Last Rate Last Admin   0.9 %  sodium chloride infusion  250 mL Intravenous PRN Antonio John, MD       sodium chloride flush (NS) 0.9 % injection 3 mL  3 mL Intravenous Q12H Antonio John, MD       sodium chloride flush (NS) 0.9 % injection 3 mL  3 mL Intravenous PRN Antonio John, MD         ROS:  See HPI  Physical Exam:  Today's Vitals   05/21/21 0642 05/21/21 0701  BP: (!) 183/107   Pulse: 71   Resp: 18   Temp: 97.9 F (36.6 C)   TempSrc: Oral   SpO2: 100%   Weight: 75.8 kg   Height: 6' (1.829 m)   PainSc:  0-No pain   Body mass index is 22.65 kg/m.   Incision:  healed nicely Extremities:   There is a palpable left radial pulse.  Motor and sensory are in tact left hand.  Motor and sensory are in tact.   There is a thrill/bruit present.  The fistula is easily palpable but feels small proximally   Dialysis Duplex on 05/16/2021: Diameter:  0.31-0.66cm Depth:  0.20-0.76cm Patent BCAVF.  Pulsatile inflow.  Very low flow volume.  Low anastomotic flow.  Elevated velocities  throughout the outflow vein   Assessment/Plan:  This is a 60 y.o. male who is s/p: left BC AVF on 04/01/2021 by Dr. Virl Cagey. He is currently dialyzing via [facility] that was placed by IR in September 2022.    -the pt does not have evidence of steal and motor and sensory are in tact.  -the fistula is not maturing proximally and there are elevated velocities throughout the fistula.  Will schedule him for a fistulogram with possible intervention.   -discussed with pt and his wife that access does not last forever and will need intervention or even new access at some point.  They expressed understanding.    Antonio Keller, [facility] [phone]  Clinic MD:  Virl Cagey

## 2021-05-21 NOTE — Op Note (Signed)
° ° °  Patient name: Antonio Keller MRN: [id number] DOB: 1961-05-09 Sex: male  05/21/2021 Pre-operative Diagnosis: End-stage renal disease, poor fistula maturation Post-operative diagnosis:  Same Surgeon:  Broadus John, MD Procedure Performed: 1.  Left arm fistulogram 2.  Balloon angioplasty of the cephalic vein at multiple locations-6 x 80 mm Mustang balloon 3.  Balloon angioplasty of the brachiocephalic vein, right subclavian vein 14 x 40 mm Atlas balloon    Indications: Patient is a 60 year old male who underwent left brachiocephalic fistula creation on 04/01/2021.  He presented to the office with poor fistula maturation and ultrasound imaging demonstrating high velocity waveforms, concerning for multiple areas of stenosis.  After discussing the risk and benefits of left arm fistulogram, he elected to proceed.  Findings:  Brachiocephalic anastomosis widely patent. Greater than 50% flow-limiting stenosis appreciated in the cephalic vein 4 cm from the anastomosis At the proximal humerus, there was a 6 cm area of cephalic vein that was tapered with the stenosis of 80% Multiple collaterals appreciated in the chest with greater than 80% stenosis of the left brachiocephalic vein. Large crossing collaterals.  Left internal jugular vein not.    Procedure:   The patient was brought to Cath Lab 8 and laid in supine position.  A timeout was performed. Case began with ultrasound-guided micropuncture access of the left brachiocephalic fistula roughly 1 cm from the anastomosis. Diagnostic imaging followed, see above results Made the decision to intervene on the multiple segments within the fistula.  I first began with upsizing the sheath a 5 [place].  From the size I used to 5 x 60 mm balloon to angioplasty the cephalic vein in multiple segments.  This appeared undersized therefore I changed the sheath to 6 [place] and used a 6 x 80 mm balloon.  This demonstrated an excellent result with resolution of  flow-limiting stenosis within the cephalic vein.  Next, my attention turned centrally, there appeared to be central stenosis on diagnostic imaging, and on further inspection it appeared the vein was likely pancaked within the chest.  A 14 x 40 mm Atlas balloon was brought into the field and expanded along the course of the innominate vein and subclavian vein.  I did not appreciate any wasting, however on follow-up angiography there was better filling of the brachiocephalic vein.  There is residual stenosis that was not amenable to balloon angioplasty, but there was improved flow, and less collateral filling.  I made the decision to stop at this point as the fistula was flowing well.   Impression: Resolution of 80% flow-limiting stenosis within the cephalic vein.   Improved, but continued central venous stenosis not amenable to 14 mm balloon angioplasty. This level of stenosis is surprising as I can find no record of previous left-sided TDC.  I will see the patient in the office in 4 weeks with repeat ultrasound.    Cassandria Santee, MD [facility] [place] Office: [phone]

## 2021-05-22 ENCOUNTER — Encounter (HOSPITAL_COMMUNITY): Payer: Self-pay | Admitting: Vascular Surgery

## 2021-05-27 ENCOUNTER — Encounter (HOSPITAL_COMMUNITY): Payer: Self-pay | Admitting: Vascular Surgery

## 2021-05-27 ENCOUNTER — Other Ambulatory Visit: Payer: BC Managed Care – PPO

## 2021-05-27 ENCOUNTER — Ambulatory Visit: Payer: BC Managed Care – PPO | Admitting: Internal Medicine

## 2021-05-28 ENCOUNTER — Telehealth: Payer: Self-pay | Admitting: Internal Medicine

## 2021-05-28 NOTE — Telephone Encounter (Signed)
Per 12/20 los, pt wife aware

## 2021-06-07 ENCOUNTER — Other Ambulatory Visit: Payer: Self-pay

## 2021-06-07 DIAGNOSIS — N186 End stage renal disease: Secondary | ICD-10-CM

## 2021-06-08 NOTE — Progress Notes (Signed)
Cardiology Office Note:    Date:  06/13/2021   ID:  Antonio Keller, DOB [birth year], MRN [id number]  PCP:  Antonio Croak, MD  Cardiologist:  Antonio Heinz, MD  Electrophysiologist:  None   Referring MD: Antonio Croak, MD   Chief Complaint  Patient presents with   Atrial Fibrillation     History of Present Illness:    Antonio Keller is a 61 y.o. male with a hx of paroxysmal atrial fibrillation, chronic combined systolic and diastolic heart failure, ESRD, HTN who presents for follow-up.  He was first seen in the office on 08/23/2020.  He was ESRD but at the time had not started on dialysis.  Had been referred for chest pain evaluation.  Lexiscan Myoview was ordered but he did not follow-up.  He was admitted in 01/2021 to [facility] with progression of his kidney disease requiring starting hemodialysis.  He was found to have atrial fibrillation with RVR and new systolic heart failure.  He was started on amiodarone and converted to sinus rhythm.  Plan was for amnio 400 mg x 7 days, then 200 mg x 21 days.  Anticoagulation was attempted but he became anemic and thrombocytopenic, and was discontinued.  Echocardiogram showed EF 40 to 45%, along with severe concentric LVH.  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  Echocardiogram 02/15/2021 showed EF 40 to 45%, severe concentric LVH, grade 2 diastolic dysfunction, normal RV function, severe left atrial dilatation, mild right atrial dilatation, mild MR, mild AI, mild aortic root dilatation measuring 41 mm.  Lexiscan Myoview on 03/27/2021 showed normal perfusion, EF 46%.  Since last clinic visit, he reports that he is doing okay.  Denies any chest pain, dyspnea, lower extremity edema, or palpitations.  Reports has had issues with BP dropping low during dialysis.  He does not take his BP meds on dialysis days.   Past Medical History:  Diagnosis Date   A-fib ([place])    Anemia    Gout    Hepatitis B    learned of diagnosis 01/2021   History of  blood transfusion 07/2020   [facility]   Hypertension    Renal disorder    dialysis M-W-F, stage 5   Systolic heart failure (HCC)    Thrombocytopenia (HCC)     Current Medications: Current Meds  Medication Sig   allopurinol (ZYLOPRIM) 100 MG tablet Take 0.5 tablets (50 mg total) by mouth daily. (Patient taking differently: Take 100 mg by mouth daily.)   apixaban (ELIQUIS) 5 MG TABS tablet Take 1 tablet (5 mg total) by mouth 2 (two) times daily.   atorvastatin (LIPITOR) 40 MG tablet Take 1 tablet (40 mg total) by mouth daily.   calcitRIOL (ROCALTROL) 0.25 MCG capsule Take 1 capsule (0.25 mcg total) by mouth daily.   calcium acetate (PHOSLO) 667 MG capsule Take 2 capsules (1,334 mg total) by mouth 3 (three) times daily with meals.   calcium carbonate (OS-CAL - DOSED IN MG OF ELEMENTAL CALCIUM) 1250 (500 Ca) MG tablet Take 1 tablet (500 mg of elemental calcium total) by mouth 3 (three) times daily with meals.   carvedilol (COREG) 25 MG tablet Take 25 mg by mouth 2 (two) times daily.   chlorthalidone (HYGROTON) 25 MG tablet Take 25 mg by mouth daily.   colchicine 0.6 MG tablet Take 1 tablet (0.6 mg total) by mouth daily.   ferrous sulfate 325 (65 FE) MG tablet Take 1 tablet (325 mg total) by mouth daily.   hydrALAZINE (APRESOLINE) 50 MG tablet Take 50  mg by mouth 2 (two) times daily.   isosorbide mononitrate (IMDUR) 30 MG 24 hr tablet Take 1 tablet (30 mg total) by mouth daily.   oxyCODONE-acetaminophen (PERCOCET) 5-325 MG tablet Take 1 tablet by mouth every 6 (six) hours as needed for severe pain.   senna-docusate (SENOKOT-S) 8.6-50 MG tablet Take 1 tablet by mouth 2 (two) times daily between meals as needed for mild constipation.   Vitamin D3 (VITAMIN D) 25 MCG tablet Take 1 tablet (1,000 Units total) by mouth daily. (Patient taking differently: Take 1,000 Units by mouth every Monday, Wednesday, and Friday.)   [DISCONTINUED] amiodarone (PACERONE) 200 MG tablet Take 2 tablets (400 mg total) by  mouth daily for 7 days, THEN 1 tablet (200 mg total) daily. (Patient taking differently: 1 tablet (200 mg total) daily.)     Allergies:   Patient has no known allergies.   Social History   Socioeconomic History   Marital status: Married    Spouse name: Not on file   Number of children: Not on file   Years of education: Not on file   Highest education level: Not on file  Occupational History   Not on file  Tobacco Use   Smoking status: Never   Smokeless tobacco: Never  Vaping Use   Vaping Use: Never used  Substance and Sexual Activity   Alcohol use: Not Currently   Drug use: Never   Sexual activity: Yes    Partners: Female  Other Topics Concern   Not on file  Social History Narrative   Not on file   Social Determinants of Health   Financial Resource Strain: Not on file  Food Insecurity: Not on file  Transportation Needs: Not on file  Physical Activity: Not on file  Stress: Not on file  Social Connections: Not on file     Family History: No history of heart disease in his immediate family  ROS:   Please see the history of present illness.    All other systems reviewed and are negative.  EKGs/Labs/Other Studies Reviewed:    The following studies were reviewed today:  EKG:  EKG is ordered today.  The ekg ordered today demonstrates sinus rhythm, rate 65, LVH with repolarization abnormalities, QTC 520  Recent Labs: 12/18/2020: TSH 2.152 02/17/2021: Magnesium 1.7 05/20/2021: ALT 7; BUN 38; Creatinine 11.85; Hemoglobin 10.8; Platelet Count 117; Potassium 4.5; Sodium 139  Recent Lipid Panel    Component Value Date/Time   TRIG 130 02/15/2021 0618    Physical Exam:    VS:  BP (!) 142/98    Pulse 74    Ht 6' (1.829 m)    Wt 166 lb 9.6 oz (75.6 kg)    SpO2 99%    BMI 22.60 kg/m     Wt Readings from Last 3 Encounters:  06/10/21 166 lb 9.6 oz (75.6 kg)  05/21/21 167 lb (75.8 kg)  05/20/21 167 lb 8 oz (76 kg)     GEN: Well nourished, well developed in no acute  distress HEENT: Normal NECK: No JVD; No carotid bruits CARDIAC: RRR, no murmurs, rubs, gallops RESPIRATORY:  Clear to auscultation without rales, wheezing or rhonchi  ABDOMEN: Soft, non-tender, non-distended MUSCULOSKELETAL:  No edema; No deformity  SKIN: Warm and dry NEUROLOGIC:  Alert and oriented x 3 PSYCHIATRIC:  Normal affect   ASSESSMENT:    1. Chronic combined systolic (congestive) and diastolic (congestive) heart failure ([place])   2. Medication management   3. PAF (paroxysmal atrial fibrillation) ([place])  4. Essential hypertension   5. Pancytopenia ([place])      PLAN:    Chronic combined systolic and diastolic heart failure: Echocardiogram 02/15/2021 showed EF 40 to 45%, severe concentric LVH, grade 2 diastolic dysfunction, normal RV function, severe left atrial dilatation, mild right atrial dilatation, mild MR, mild AI, mild aortic root dilatation measuring 41 mm.  Lexiscan Myoview on 03/27/2021 showed normal perfusion, EF 46%. -Continue carvedilol 25 mg twice daily -Not on ACE/ARB/Arni/MRA given ESRD -Continue hydralazine 100 mg twice daily, Imdur 30 mg daily -Volume management via HD  Atrial fibrillation: CHA2DS2-VASc score 2 (hypertension, CHF)  -Anticoagulation was attempted during hospitalization 01/2021 but developed bleeding.  Due to anemia and thrombocytopenia, anticoagulation held -His anemia/thrombocytopenia have improved, d/w his hematologist Dr Julien Nordmann and [place] with rechallenging with Eliquis, will start 5mg  BID -Will discontinue amiodarone -Continue coreg 25 mg BID  Hypertension: On hydralazine 100 mg twice daily, carvedilol 25 mg twice daily, Imdur 30 mg daily  ESRD: On HD  Pancytopenia: Follows with hematology, has improved, most recent platelet count 117 on 05/20/2021.  Was down to 28s during admission 01/2021   RTC in 3 months     Medication Adjustments/Labs and Tests Ordered: Current medicines are reviewed at length with the patient today.  Concerns  regarding medicines are outlined above.  Orders Placed This Encounter  Procedures   CBC with Differential/Platelet    Meds ordered this encounter  Medications   apixaban (ELIQUIS) 5 MG TABS tablet    Sig: Take 1 tablet (5 mg total) by mouth 2 (two) times daily.    Dispense:  180 tablet    Refill:  3     Patient Instructions  Medication Instructions:  Your physician has recommended you make the following change in your medication:  START: Eliquis 5 mg twice daily STOP: Amiodarone *If you need a refill on your cardiac medications before your next appointment, please call your pharmacy*   Lab Work: Your physician recommends that you return for lab work in:  In 1 month (around Feb 10th): CBC If you have labs (blood work) drawn today and your tests are completely normal, you will receive your results only by: [employer] (if you have MyChart) OR A paper copy in the mail If you have any lab test that is abnormal or we need to change your treatment, we will call you to review the results.   Testing/Procedures: None   Follow-Up: At [facility], you and your health needs are our priority.  As part of our continuing mission to provide you with exceptional heart care, we have created designated Provider Care Teams.  These Care Teams include your primary Cardiologist (physician) and Advanced Practice Providers (APPs -  Physician Assistants and Nurse Practitioners) who all work together to provide you with the care you need, when you need it.  We recommend signing up for the patient portal called "MyChart".  Sign up information is provided on this After Visit Summary.  MyChart is used to connect with patients for Virtual Visits (Telemedicine).  Patients are able to view lab/test results, encounter notes, upcoming appointments, etc.  Non-urgent messages can be sent to your provider as well.   To learn more about what you can do with MyChart, go to [email or web address].    Your  next appointment:   3 month(s)  The format for your next appointment:   In Person  Provider:   Donato Heinz, MD     Other Instructions     Signed,  Antonio Heinz, MD  06/13/2021 1:00 PM    [place]

## 2021-06-10 ENCOUNTER — Encounter: Payer: Self-pay | Admitting: Cardiology

## 2021-06-10 ENCOUNTER — Other Ambulatory Visit: Payer: Self-pay

## 2021-06-10 ENCOUNTER — Ambulatory Visit: Payer: BC Managed Care – PPO | Admitting: Cardiology

## 2021-06-10 VITALS — BP 142/98 | HR 74 | Ht 72.0 in | Wt 166.6 lb

## 2021-06-10 DIAGNOSIS — I10 Essential (primary) hypertension: Secondary | ICD-10-CM

## 2021-06-10 DIAGNOSIS — I5042 Chronic combined systolic (congestive) and diastolic (congestive) heart failure: Secondary | ICD-10-CM

## 2021-06-10 DIAGNOSIS — Z79899 Other long term (current) drug therapy: Secondary | ICD-10-CM

## 2021-06-10 DIAGNOSIS — I480 Paroxysmal atrial fibrillation: Secondary | ICD-10-CM

## 2021-06-10 DIAGNOSIS — D61818 Other pancytopenia: Secondary | ICD-10-CM

## 2021-06-10 MED ORDER — APIXABAN 5 MG PO TABS: 5.0000 mg | ORAL_TABLET | Freq: Two times a day (BID) | ORAL | 3 refills | Status: DC

## 2021-06-10 NOTE — Patient Instructions (Addendum)
Medication Instructions:  Your physician has recommended you make the following change in your medication:  START: Eliquis 5 mg twice daily STOP: Amiodarone *If you need a refill on your cardiac medications before your next appointment, please call your pharmacy*   Lab Work: Your physician recommends that you return for lab work in:  In 1 month (around Feb 10th): CBC If you have labs (blood work) drawn today and your tests are completely normal, you will receive your results only by: [employer] (if you have MyChart) OR A paper copy in the mail If you have any lab test that is abnormal or we need to change your treatment, we will call you to review the results.   Testing/Procedures: None   Follow-Up: At [facility], you and your health needs are our priority.  As part of our continuing mission to provide you with exceptional heart care, we have created designated Provider Care Teams.  These Care Teams include your primary Cardiologist (physician) and Advanced Practice Providers (APPs -  Physician Assistants and Nurse Practitioners) who all work together to provide you with the care you need, when you need it.  We recommend signing up for the patient portal called "MyChart".  Sign up information is provided on this After Visit Summary.  MyChart is used to connect with patients for Virtual Visits (Telemedicine).  Patients are able to view lab/test results, encounter notes, upcoming appointments, etc.  Non-urgent messages can be sent to your provider as well.   To learn more about what you can do with MyChart, go to [email or web address].    Your next appointment:   3 month(s)  The format for your next appointment:   In Person  Provider:   Donato Heinz, MD     Other Instructions

## 2021-06-16 ENCOUNTER — Telehealth: Payer: Self-pay | Admitting: Cardiology

## 2021-06-16 NOTE — Telephone Encounter (Signed)
Patient's wife has some questions to ask Dr. Gardiner Rhyme or nurse about some medications that was prescribed. Please call back to discuss

## 2021-06-16 NOTE — Telephone Encounter (Signed)
Spoke with pt wife, she reports the eliquis is too expensive and they can  not afford. Samples and savings cards placed at the front desk for pick up. The patient has BCBS and should be able to use the $10 card as well.

## 2021-06-19 ENCOUNTER — Telehealth: Payer: Self-pay | Admitting: Cardiology

## 2021-06-19 DIAGNOSIS — K921 Melena: Secondary | ICD-10-CM

## 2021-06-19 NOTE — Telephone Encounter (Signed)
Called to say that patient had blood in his stool. Please advise

## 2021-06-19 NOTE — Telephone Encounter (Signed)
Returned call to wife she states that pt had blood in his stool so he stopped the eliquis because this is what Dr Gardiner Rhyme told him to do so they are letting Dr Gardiner Rhyme know about the blood and stopping the medication. This was yesterday and pt only had the 1 time. Explained to Wife not to stop Eliquis and to give [place] a call first. Verbalized understanding. Pt will re-start medication and await Dr Newman Nickels response

## 2021-06-19 NOTE — Telephone Encounter (Signed)
Called back to say the patient dosent want to be on the blood thinner. He is refusing until he speak to Dr Gardiner Rhyme. Please advise

## 2021-06-20 ENCOUNTER — Encounter (HOSPITAL_COMMUNITY): Payer: BC Managed Care – PPO

## 2021-06-20 ENCOUNTER — Encounter: Payer: BC Managed Care – PPO | Admitting: Vascular Surgery

## 2021-06-20 NOTE — Telephone Encounter (Signed)
Spoke with patient.  Has had 2 episodes this week of hematochezia, small amount of blood.  None since he stopped taking his Eliquis.  Recommend holding Eliquis for now and referral to GI for evaluation.

## 2021-06-23 NOTE — Telephone Encounter (Signed)
Referral placed for GI 

## 2021-06-24 ENCOUNTER — Encounter: Payer: Self-pay | Admitting: Vascular Surgery

## 2021-06-24 ENCOUNTER — Telehealth: Payer: Self-pay | Admitting: Cardiology

## 2021-06-24 ENCOUNTER — Ambulatory Visit (HOSPITAL_COMMUNITY)
Admission: RE | Admit: 2021-06-24 | Discharge: 2021-06-24 | Disposition: A | Discharge status: Home / Self Care | Payer: BC Managed Care – PPO | Source: Ambulatory Visit | Attending: Vascular Surgery | Admitting: Vascular Surgery

## 2021-06-24 ENCOUNTER — Ambulatory Visit (INDEPENDENT_AMBULATORY_CARE_PROVIDER_SITE_OTHER): Payer: BC Managed Care – PPO | Admitting: Vascular Surgery

## 2021-06-24 ENCOUNTER — Other Ambulatory Visit: Payer: Self-pay

## 2021-06-24 VITALS — BP 120/78 | HR 71 | Temp 98.2°F | Ht 72.0 in | Wt 167.5 lb

## 2021-06-24 DIAGNOSIS — N186 End stage renal disease: Secondary | ICD-10-CM | POA: Diagnosis present

## 2021-06-24 DIAGNOSIS — Z992 Dependence on renal dialysis: Secondary | ICD-10-CM | POA: Diagnosis not present

## 2021-06-24 NOTE — Telephone Encounter (Signed)
°  Pt c/o medication issue:  1. Name of Medication: amiodarone   2. How are you currently taking this medication (dosage and times per day)?   3. Are you having a reaction (difficulty breathing--STAT)?   4. What is your medication issue? Pt's wife calling, she said pt had to stopped taking eliquis due to bleeding, now pt needs heart meds, they wanted to ask Dr. Gardiner Rhyme if he needs to start taking amiodarone again, if so, pt need prescription sent to his pharmacy

## 2021-06-24 NOTE — Telephone Encounter (Signed)
Spoke with wife - amiodarone was stopped on 06/10/21, eliquis started Eliquis stopped 06/20/21 d/t bleeding  Asked if he has had any AFib episodes  She reports patient had 1 episode of feeling clammy, sweating, HR 114 She went to pharmacy over the weekend and requested a one-tome fill of amiodarone from pharmacy and started taking it  She is asking for a refill of amiodarone since he is off Eliquis and not on anything for his heart  Will send to MD to review, as amiodarone is not on the list  Provided them with phone # for [facility] (MyChart message - patient does not check this portal)

## 2021-06-24 NOTE — Progress Notes (Signed)
Patient name: Antonio Keller MRN: [id number] DOB: [birth year] Sex: male  REASON FOR VISIT: Poorly maturing left arm AV fistula  HPI: Antonio Keller is a 61 y.o. male with end-stage renal disease on dialysis Monday Wednesday Friday that presents for evaluation of poorly maturing left arm AV fistula.  Patient had a left brachiocephalic AV fistula on 41/11/4079 with Dr. Virl Cagey and then underwent left upper extremity fistulogram on 05/21/2021 for poorly maturing AV fistula with angioplasty of the cephalic vein for both a peripheral and central stenosis.  He is currently using a right IJ tunneled catheter that has been in place since September of last year.  He has no history of left-sided catheters.  Past Medical History:  Diagnosis Date   A-fib ([place])    Anemia    Gout    Hepatitis B    learned of diagnosis 01/2021   History of blood transfusion 07/2020   [facility]   Hypertension    Renal disorder    dialysis M-W-F, stage 5   Systolic heart failure (HCC)    Thrombocytopenia [facility])     Past Surgical History:  Procedure Laterality Date   A/V FISTULAGRAM Left 05/21/2021   Procedure: A/V FISTULAGRAM;  Surgeon: Broadus John, MD;  Location: [facility];  Service: Cardiovascular;  Laterality: Left;   AV FISTULA PLACEMENT Left 04/01/2021   Procedure: LEFT ARM ARTERIOVENOUS (AV) FISTULA CREATION;  Surgeon: Broadus John, MD;  Location: [facility];  Service: Vascular;  Laterality: Left;   COLONOSCOPY     IR FLUORO GUIDE CV LINE RIGHT  02/15/2021   IR US GUIDE VASC ACCESS RIGHT  02/15/2021   PERIPHERAL VASCULAR BALLOON ANGIOPLASTY Left 05/21/2021   Procedure: PERIPHERAL VASCULAR BALLOON ANGIOPLASTY;  Surgeon: Broadus John, MD;  Location: [facility];  Service: Cardiovascular;  Laterality: Left;    No family history on file.  SOCIAL HISTORY: Social History   Tobacco Use   Smoking status: Never   Smokeless tobacco: Never  Substance Use Topics   Alcohol use: Not Currently     No Known Allergies  Current Outpatient Medications  Medication Sig Dispense Refill   allopurinol (ZYLOPRIM) 100 MG tablet Take 0.5 tablets (50 mg total) by mouth daily. (Patient taking differently: Take 100 mg by mouth daily.) 30 tablet 1   atorvastatin (LIPITOR) 40 MG tablet Take 1 tablet (40 mg total) by mouth daily. 90 tablet 1   carvedilol (COREG) 25 MG tablet Take 25 mg by mouth 2 (two) times daily.     chlorthalidone (HYGROTON) 25 MG tablet Take 25 mg by mouth daily.     colchicine 0.6 MG tablet Take 1 tablet (0.6 mg total) by mouth daily. 30 tablet 1   ferrous sulfate 325 (65 FE) MG tablet Take 1 tablet (325 mg total) by mouth daily. 90 tablet 3   isosorbide mononitrate (IMDUR) 30 MG 24 hr tablet Take 1 tablet (30 mg total) by mouth daily. 90 tablet 1   oxyCODONE-acetaminophen (PERCOCET) 5-325 MG tablet Take 1 tablet by mouth every 6 (six) hours as needed for severe pain. 8 tablet 0   senna-docusate (SENOKOT-S) 8.6-50 MG tablet Take 1 tablet by mouth 2 (two) times daily between meals as needed for mild constipation. 180 tablet 0   Vitamin D3 (VITAMIN D) 25 MCG tablet Take 1 tablet (1,000 Units total) by mouth daily. (Patient taking differently: Take 1,000 Units by mouth every Monday, Wednesday, and Friday.) 30 tablet 0   apixaban (ELIQUIS) 5 MG TABS  tablet Take 1 tablet (5 mg total) by mouth 2 (two) times daily. (Patient not taking: Reported on 06/24/2021) 180 tablet 3   calcitRIOL (ROCALTROL) 0.25 MCG capsule Take 1 capsule (0.25 mcg total) by mouth daily. 90 capsule 1   calcium acetate (PHOSLO) 667 MG capsule Take 2 capsules (1,334 mg total) by mouth 3 (three) times daily with meals. 270 capsule 1   calcium carbonate (OS-CAL - DOSED IN MG OF ELEMENTAL CALCIUM) 1250 (500 Ca) MG tablet Take 1 tablet (500 mg of elemental calcium total) by mouth 3 (three) times daily with meals. 270 tablet 1   hydrALAZINE (APRESOLINE) 50 MG tablet Take 50 mg by mouth 2 (two) times daily.     No current  facility-administered medications for this visit.    REVIEW OF SYSTEMS:  [X]  denotes positive finding, [ ]  denotes negative finding Cardiac  Comments:  Chest pain or chest pressure:    Shortness of breath upon exertion:    Short of breath when lying flat:    Irregular heart rhythm:        Vascular    Pain in calf, thigh, or hip brought on by ambulation:    Pain in feet at night that wakes you up from your sleep:     Blood clot in your veins:    Leg swelling:         Pulmonary    Oxygen at home:    Productive cough:     Wheezing:         Neurologic    Sudden weakness in arms or legs:     Sudden numbness in arms or legs:     Sudden onset of difficulty speaking or slurred speech:    Temporary loss of vision in one eye:     Problems with dizziness:         Gastrointestinal    Blood in stool:     Vomited blood:         Genitourinary    Burning when urinating:     Blood in urine:        Psychiatric    Major depression:         Hematologic    Bleeding problems:    Problems with blood clotting too easily:        Skin    Rashes or ulcers:        Constitutional    Fever or chills:      PHYSICAL EXAM: Vitals:   06/24/21 1608  BP: 120/78  Pulse: 71  Temp: 98.2 F (36.8 C)  Weight: 167 lb 8 oz (76 kg)  Height: 6' (1.829 m)    GENERAL: The patient is a well-nourished male, in no acute distress. The vital signs are documented above. CARDIAC: There is a regular rate and rhythm.  VASCULAR:  Left brachiocephalic fistula with a thrill that feels slightly pulsatile RIJ tunneled dialysis catheter PULMONARY: No respiratory distress. ABDOMEN: Soft and non-tender. MUSCULOSKELETAL: There are no major deformities or cyanosis. NEUROLOGIC: No focal weakness or paresthesias are detected. SKIN: There are no ulcers or rashes noted. PSYCHIATRIC: The patient has a normal affect.  DATA:   Left upper extremity fistula duplex shows a patent fistula that remains small measuring  3 to 4 mm in the mid to proximal upper arm.  There are several competing side branches that are 0.3 and 0.25 cm in size.  Assessment/Plan:  61 year old male presents with slow to mature left arm AV fistula.  This is  a brachiocephalic placed by Dr. Virl Cagey and has undergone secondary intervention with balloon angioplasty for peripheral and central stenosis.  On exam he does have a thrill although it feels slightly pulsatile.  He does not have any significant arm swelling even in the setting of known central stenosis.  We discussed options of trying to continue to salvage this fistula in the left arm versus moving to the right arm.  I am concerned about immediately moving to the right arm as he has a tunneled catheter on the right and will be at high risk for central stenosis on the right as well.  I have ultimately recommended sidebranch ligation as he appears to have several competing branches on duplex and also seen on recent fistulogram and I will also perform additional balloon intervention at the time with a hybrid approach in the OR.  Risk benefits discussed.  I think if this fails then we will be forced to move to the other arm.   Marty Heck, MD [facility] Office: [phone]

## 2021-06-25 ENCOUNTER — Other Ambulatory Visit: Payer: Self-pay

## 2021-06-25 MED ORDER — AMIODARONE HCL 200 MG PO TABS
200.0000 mg | ORAL_TABLET | Freq: Every day | ORAL | 3 refills | Status: AC
Start: 2021-06-25 — End: ?

## 2021-06-25 NOTE — Telephone Encounter (Signed)
Spoke with pt, aware of dr schumann's recommendations. New script sent to the pharmacy  

## 2021-06-25 NOTE — Telephone Encounter (Signed)
Yes lets restart amio 200 mg daily

## 2021-07-04 ENCOUNTER — Encounter (HOSPITAL_COMMUNITY): Payer: Self-pay | Admitting: Vascular Surgery

## 2021-07-04 ENCOUNTER — Other Ambulatory Visit: Payer: Self-pay

## 2021-07-04 NOTE — Progress Notes (Signed)
Anesthesia Chart Review:  Follows with cardiology for history of accessible atrial fibrillation (off Eliquis due to 2 episodes of hematochezia, awaiting GI evaluation), chronic combined heart failure, HTN. Echocardiogram 02/15/2021 showed EF 40 to 45%, severe concentric LVH, grade 2 diastolic dysfunction, normal RV function, severe left atrial dilatation, mild right atrial dilatation, mild MR, mild AI, mild aortic root dilatation measuring 41 mm.  Lexiscan Myoview on 03/27/2021 showed normal perfusion, EF 46%.  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  ESRD on HD Monday Wednesday Friday currently dialyzing through right IJ tunnel catheter.  Patient will need day of surgery labs and evaluation.  EKG 03/27/2021: Sinus rhythm.  Rate 65.  Prolonged PR interval.  LVH with secondary repolarization abnormality.  Borderline prolonged QT intervals.  Nuclear stress test 03/27/21:   Findings are consistent with no ischemia. The study is intermediate risk.   No ST deviation was noted.   LV perfusion is normal. There is no evidence of ischemia. There is no evidence of infarction.   Left ventricular function is abnormal. Nuclear stress EF: 46 %. The left ventricular ejection fraction is mildly decreased (45-54%). End diastolic cavity size is severely enlarged. End systolic cavity size is severely enlarged.   Prior study not available for comparison.   Echo 02/15/21: IMPRESSIONS   1. Left ventricular ejection fraction, by estimation, is 40 to 45%. The  left ventricle has mildly decreased function. The left ventricle has no  regional wall motion abnormalities. There is severe concentric left  ventricular hypertrophy. Left ventricular   diastolic parameters are consistent with Grade II diastolic dysfunction  (pseudonormalization). Elevated left ventricular end-diastolic pressure.   2. Right ventricular systolic function is normal. The right ventricular   size is normal. Tricuspid regurgitation signal is inadequate for assessing  PA pressure.   3. Left atrial size was severely dilated.   4. Right atrial size was mildly dilated.   5. The mitral valve is grossly normal. Mild mitral valve regurgitation.   6. The aortic valve is tricuspid. There is mild thickening of the aortic  valve. Aortic valve regurgitation is trivial. No aortic stenosis is  present. Aortic valve mean gradient measures 6.0 mmHg.   7. Aortic dilatation noted. There is mild dilatation of the aortic root,  measuring 41 mm.   8. The inferior vena cava is normal in size with <50% respiratory  variability, suggesting right atrial pressure of 8 mmHg.  - Comparison(s): No prior Echocardiogram.    Wynonia Musty [facility] Stay [facility]/Anesthesiology Phone [phone] 07/04/2021 1:54 PM

## 2021-07-04 NOTE — Anesthesia Preprocedure Evaluation (Addendum)
Anesthesia Evaluation  Patient identified by MRN, date of birth, ID band Patient awake    Reviewed: Allergy & Precautions, NPO status , Patient's Chart, lab work & pertinent test results  Airway Mallampati: II  TM Distance: >3 FB Neck ROM: Full    Dental no notable dental hx.    Pulmonary neg pulmonary ROS,    Pulmonary exam normal breath sounds clear to auscultation       Cardiovascular hypertension (poorly controlled), Pt. on medications +CHF  Normal cardiovascular exam+ dysrhythmias (not anticoagulated due to GI bleeds) Atrial Fibrillation  Rhythm:Regular Rate:Normal     Neuro/Psych negative neurological ROS  negative psych ROS   GI/Hepatic (+) Hepatitis -, B  Endo/Other    Renal/GU ESRF and DialysisRenal disease (MWF)     Musculoskeletal   Abdominal   Peds negative pediatric ROS (+)  Hematology  (+) Blood dyscrasia, anemia , thrombocytopenia   Anesthesia Other Findings   Reproductive/Obstetrics                           Anesthesia Physical Anesthesia Plan  ASA: 4  Anesthesia Plan: General   Post-op Pain Management:    Induction: Intravenous  PONV Risk Score and Plan: 2  Airway Management Planned: LMA  Additional Equipment: None  Intra-op Plan:   Post-operative Plan: Extubation in OR  Informed Consent: I have reviewed the patients History and Physical, chart, labs and discussed the procedure including the risks, benefits and alternatives for the proposed anesthesia with the patient or authorized representative who has indicated his/her understanding and acceptance.     Dental advisory given  Plan Discussed with: CRNA, Anesthesiologist and Surgeon  Anesthesia Plan Comments: (Last dialyzed Friday 07/04/21. Going to dialysis this afternoon. BP very elevated despite taking anti-hypertensives appropriately. Will treat with IV hydralazine this AM and monitor/treat  perioperatively. Norton Blizzard, MD     PAT note by Karoline Caldwell, PA-C:  Follows with cardiology for history of accessible atrial fibrillation (off Eliquis due to 2 episodes of hematochezia, awaiting GI evaluation), chronic combined heart failure, HTN. Echocardiogram 02/15/2021 showed EF 40 to 45%, severe concentric LVH, grade 2 diastolic dysfunction, normal RV function, severe left atrial dilatation, mild right atrial dilatation, mild MR, mild AI, mild aortic root dilatation measuring 41 mm.Lexiscan Myoview on 03/27/2021 showed normal perfusion, EF 46%.  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  ESRD on HD Monday Wednesday Friday currently dialyzing through right IJ tunnel catheter.  Patient will need day of surgery labs and evaluation.  EKG 03/27/2021: Sinus rhythm.  Rate 65.  Prolonged PR interval.  LVH with secondary repolarization abnormality.  Borderline prolonged QT intervals.  Nuclear stress test 03/27/21:  Findings are consistent with no ischemia. The study is intermediate risk.  No ST deviation was noted.  LV perfusion is normal. There is no evidence of ischemia. There is no evidence of infarction.  Left ventricular function is abnormal. Nuclear stress EF: 46 %. The left ventricular ejection fraction is mildly decreased (45-54%). End diastolic cavity size is severely enlarged. End systolic cavity size is severely enlarged.  Prior study not available for comparison.  Echo 02/15/21: IMPRESSIONS  1. Left ventricular ejection fraction, by estimation, is 40 to 45%. The  left ventricle has mildly decreased function. The left ventricle has no  regional wall motion abnormalities. There is severe concentric left  ventricular hypertrophy. Left ventricular  diastolic parameters are consistent with Grade II diastolic dysfunction  (  pseudonormalization). Elevated left ventricular end-diastolic pressure.  2. Right ventricular  systolic function is normal. The right ventricular  size is normal. Tricuspid regurgitation signal is inadequate for assessing  PA pressure.  3. Left atrial size was severely dilated.  4. Right atrial size was mildly dilated.  5. The mitral valve is grossly normal. Mild mitral valve regurgitation.  6. The aortic valve is tricuspid. There is mild thickening of the aortic  valve. Aortic valve regurgitation is trivial. No aortic stenosis is  present. Aortic valve mean gradient measures 6.0 mmHg.  7. Aortic dilatation noted. There is mild dilatation of the aortic root,  measuring 41 mm.  8. The inferior vena cava is normal in size with <50% respiratory  variability, suggesting right atrial pressure of 8 mmHg.  -Comparison(s): No prior Echocardiogram. )    Anesthesia Quick Evaluation

## 2021-07-04 NOTE — Progress Notes (Signed)
Pre-Op interview done with the wife Alfreda due to the pt being at dialysis.  PCP - Dr. Raymond Gurney, B.  Cardiologist - Dr. Gardiner Rhyme, C.  EP- Denies  Endocrine- Denies  Pulm- Denies  Chest x-ray - 02/14/21 (E)  EKG - 03/27/21 (E)  Stress Test - 03/27/21 (E)  ECHO - 02/15/21 (E)  Cardiac Cath - Denies  AICD- na PM- na LOOP- na  Nerve Stimulator- na  Dialysis- M-W-F- pt currently at dialysis center  Sleep Study - Denies CPAP - Denies  LABS- 07/07/21: I-Stat 8  ASA- Denies  ERAS- No  HA1C- Denies  Anesthesia- Yes- med/cardiac history  The wife Alfreda denies the pt having chest pain, sob, or fever during the pre-op phone call. All instructions explained to Alfreda, with a verbal understanding of the material including: as of today, stop taking all Aspirin (unless instructed by your doctor) and Other Aspirin containing products, Vitamins, Fish oils, and Herbal medications. Also stop all NSAIDS i.e. Advil, Ibuprofen, Motrin, Aleve, Anaprox, Naproxen, BC, Goody Powders, and all Supplements.  Alfreda also instructed to have the pt wear a mask and social distance if he goes out. The opportunity to ask questions was provided.    Coronavirus Screening  Have you experienced the following symptoms:  Cough yes/no: No Fever (>100.61F)  yes/no: No Runny nose yes/no: No Sore throat yes/no: No Difficulty breathing/shortness of breath  yes/no: No  Have you or a family member traveled in the last 14 days and where? yes/no: No   If the patient indicates "YES" to the above questions, their PAT will be rescheduled to limit the exposure to others and, the surgeon will be notified. THE PATIENT WILL NEED TO BE ASYMPTOMATIC FOR 14 DAYS.   If the patient is not experiencing any of these symptoms, the PAT nurse will instruct them to NOT bring anyone with them to their appointment since they may have these symptoms or traveled as well.   Please remind your patients and families that hospital  visitation restrictions are in effect and the importance of the restrictions.

## 2021-07-07 ENCOUNTER — Ambulatory Visit (HOSPITAL_COMMUNITY): Payer: BC Managed Care – PPO

## 2021-07-07 ENCOUNTER — Ambulatory Visit (HOSPITAL_COMMUNITY): Payer: BC Managed Care – PPO | Admitting: Physician Assistant

## 2021-07-07 ENCOUNTER — Ambulatory Visit (HOSPITAL_COMMUNITY)
Admission: RE | Admit: 2021-07-07 | Discharge: 2021-07-07 | Disposition: A | Discharge status: Home / Self Care | Payer: BC Managed Care – PPO | Attending: Vascular Surgery | Admitting: Vascular Surgery

## 2021-07-07 ENCOUNTER — Other Ambulatory Visit: Payer: Self-pay

## 2021-07-07 ENCOUNTER — Encounter (HOSPITAL_COMMUNITY)
Admission: RE | Disposition: A | Discharge status: Home / Self Care | Payer: Self-pay | Source: Home / Self Care | Attending: Vascular Surgery

## 2021-07-07 ENCOUNTER — Encounter (HOSPITAL_COMMUNITY): Payer: Self-pay | Admitting: Vascular Surgery

## 2021-07-07 DIAGNOSIS — Y718 Miscellaneous cardiovascular devices associated with adverse incidents, not elsewhere classified: Secondary | ICD-10-CM | POA: Insufficient documentation

## 2021-07-07 DIAGNOSIS — N186 End stage renal disease: Secondary | ICD-10-CM

## 2021-07-07 DIAGNOSIS — I132 Hypertensive heart and chronic kidney disease with heart failure and with stage 5 chronic kidney disease, or end stage renal disease: Secondary | ICD-10-CM | POA: Diagnosis not present

## 2021-07-07 DIAGNOSIS — I5020 Unspecified systolic (congestive) heart failure: Secondary | ICD-10-CM | POA: Insufficient documentation

## 2021-07-07 DIAGNOSIS — D696 Thrombocytopenia, unspecified: Secondary | ICD-10-CM | POA: Insufficient documentation

## 2021-07-07 DIAGNOSIS — T82858A Stenosis of vascular prosthetic devices, implants and grafts, initial encounter: Secondary | ICD-10-CM

## 2021-07-07 DIAGNOSIS — D631 Anemia in chronic kidney disease: Secondary | ICD-10-CM | POA: Insufficient documentation

## 2021-07-07 DIAGNOSIS — Z992 Dependence on renal dialysis: Secondary | ICD-10-CM | POA: Diagnosis not present

## 2021-07-07 DIAGNOSIS — I083 Combined rheumatic disorders of mitral, aortic and tricuspid valves: Secondary | ICD-10-CM | POA: Insufficient documentation

## 2021-07-07 DIAGNOSIS — I4891 Unspecified atrial fibrillation: Secondary | ICD-10-CM | POA: Diagnosis not present

## 2021-07-07 DIAGNOSIS — T82590A Other mechanical complication of surgically created arteriovenous fistula, initial encounter: Secondary | ICD-10-CM | POA: Diagnosis not present

## 2021-07-07 HISTORY — PX: PATCH ANGIOPLASTY: SHX6230

## 2021-07-07 HISTORY — PX: FISTULOGRAM: SHX5832

## 2021-07-07 HISTORY — PX: ANGIOPLASTY: SHX39

## 2021-07-07 HISTORY — PX: REVISON OF ARTERIOVENOUS FISTULA: SHX6074

## 2021-07-07 LAB — POCT I-STAT, CHEM 8
BUN: 46 mg/dL — ABNORMAL HIGH (ref 6–20)
Calcium, Ion: 1.25 mmol/L (ref 1.15–1.40)
Chloride: 100 mmol/L (ref 98–111)
Creatinine, Ser: 17.2 mg/dL — ABNORMAL HIGH (ref 0.61–1.24)
Glucose, Bld: 88 mg/dL (ref 70–99)
HCT: 36 % — ABNORMAL LOW (ref 39.0–52.0)
Hemoglobin: 12.2 g/dL — ABNORMAL LOW (ref 13.0–17.0)
Potassium: 5.3 mmol/L — ABNORMAL HIGH (ref 3.5–5.1)
Sodium: 137 mmol/L (ref 135–145)
TCO2: 32 mmol/L (ref 22–32)

## 2021-07-07 SURGERY — REVISON OF ARTERIOVENOUS FISTULA
Anesthesia: General | Site: Arm Upper | Laterality: Left

## 2021-07-07 MED ORDER — HEPARIN 6000 UNIT IRRIGATION SOLUTION
Status: DC | PRN
  Administered 2021-07-07: 1

## 2021-07-07 MED ORDER — FENTANYL CITRATE (PF) 100 MCG/2ML IJ SOLN: 25.0000 ug | INTRAMUSCULAR | Status: DC | PRN

## 2021-07-07 MED ORDER — IODIXANOL 320 MG/ML IV SOLN
INTRAVENOUS | Status: DC | PRN
  Administered 2021-07-07: 40 mL via INTRAVENOUS

## 2021-07-07 MED ORDER — CHLORHEXIDINE GLUCONATE 0.12 % MT SOLN
15.0000 mL | Freq: Once | OROMUCOSAL | Status: AC
  Administered 2021-07-07: 15 mL via OROMUCOSAL
  Filled 2021-07-07: qty 15

## 2021-07-07 MED ORDER — ORAL CARE MOUTH RINSE: 15.0000 mL | Freq: Once | OROMUCOSAL | Status: AC

## 2021-07-07 MED ORDER — CEFAZOLIN SODIUM-DEXTROSE 2-4 GM/100ML-% IV SOLN
2.0000 g | INTRAVENOUS | Status: AC
  Administered 2021-07-07: 2 g via INTRAVENOUS
  Filled 2021-07-07: qty 100

## 2021-07-07 MED ORDER — SODIUM CHLORIDE 0.9 % IV SOLN: INTRAVENOUS | Status: DC

## 2021-07-07 MED ORDER — PROPOFOL 10 MG/ML IV BOLUS
INTRAVENOUS | Status: DC | PRN
  Administered 2021-07-07: 200 mg via INTRAVENOUS

## 2021-07-07 MED ORDER — PROPOFOL 10 MG/ML IV BOLUS
INTRAVENOUS | Status: AC
  Filled 2021-07-07: qty 20

## 2021-07-07 MED ORDER — MIDAZOLAM HCL 2 MG/2ML IJ SOLN
INTRAMUSCULAR | Status: AC
  Filled 2021-07-07: qty 2

## 2021-07-07 MED ORDER — PHENYLEPHRINE 40 MCG/ML (10ML) SYRINGE FOR IV PUSH (FOR BLOOD PRESSURE SUPPORT)
PREFILLED_SYRINGE | INTRAVENOUS | Status: AC
  Filled 2021-07-07: qty 10

## 2021-07-07 MED ORDER — DEXAMETHASONE SODIUM PHOSPHATE 10 MG/ML IJ SOLN
INTRAMUSCULAR | Status: DC | PRN
Start: 2021-07-07 — End: 2021-07-07
  Administered 2021-07-07: 10 mg via INTRAVENOUS

## 2021-07-07 MED ORDER — FENTANYL CITRATE (PF) 250 MCG/5ML IJ SOLN
INTRAMUSCULAR | Status: DC | PRN
  Administered 2021-07-07 (×2): 50 ug via INTRAVENOUS

## 2021-07-07 MED ORDER — ACETAMINOPHEN 500 MG PO TABS
1000.0000 mg | ORAL_TABLET | Freq: Once | ORAL | Status: AC
  Administered 2021-07-07: 1000 mg via ORAL
  Filled 2021-07-07: qty 2

## 2021-07-07 MED ORDER — FENTANYL CITRATE (PF) 250 MCG/5ML IJ SOLN
INTRAMUSCULAR | Status: AC
  Filled 2021-07-07: qty 5

## 2021-07-07 MED ORDER — HYDRALAZINE HCL 20 MG/ML IJ SOLN
INTRAMUSCULAR | Status: AC
  Administered 2021-07-07: 10 mg via INTRAVENOUS
  Filled 2021-07-07: qty 1

## 2021-07-07 MED ORDER — CHLORHEXIDINE GLUCONATE 4 % EX LIQD: 60.0000 mL | Freq: Once | CUTANEOUS | Status: DC

## 2021-07-07 MED ORDER — 0.9 % SODIUM CHLORIDE (POUR BTL) OPTIME
TOPICAL | Status: DC | PRN
  Administered 2021-07-07: 1000 mL

## 2021-07-07 MED ORDER — PROTAMINE SULFATE 10 MG/ML IV SOLN
INTRAVENOUS | Status: DC | PRN
  Administered 2021-07-07: 20 mg via INTRAVENOUS

## 2021-07-07 MED ORDER — ONDANSETRON HCL 4 MG/2ML IJ SOLN: 4.0000 mg | Freq: Once | INTRAMUSCULAR | Status: DC | PRN

## 2021-07-07 MED ORDER — OXYCODONE HCL 5 MG PO TABS: 5.0000 mg | ORAL_TABLET | Freq: Once | ORAL | Status: DC | PRN

## 2021-07-07 MED ORDER — LIDOCAINE 2% (20 MG/ML) 5 ML SYRINGE
INTRAMUSCULAR | Status: DC | PRN
Start: 2021-07-07 — End: 2021-07-07
  Administered 2021-07-07: 20 mg via INTRAVENOUS

## 2021-07-07 MED ORDER — OXYCODONE-ACETAMINOPHEN 5-325 MG PO TABS
1.0000 | ORAL_TABLET | Freq: Four times a day (QID) | ORAL | 0 refills | Status: DC | PRN
Start: 2021-07-07 — End: 2021-10-24

## 2021-07-07 MED ORDER — MIDAZOLAM HCL 2 MG/2ML IJ SOLN
INTRAMUSCULAR | Status: DC | PRN
  Administered 2021-07-07 (×2): 1 mg via INTRAVENOUS

## 2021-07-07 MED ORDER — HEPARIN SODIUM (PORCINE) 1000 UNIT/ML IJ SOLN
INTRAMUSCULAR | Status: DC | PRN
Start: 2021-07-07 — End: 2021-07-07
  Administered 2021-07-07 (×2): 3000 [IU] via INTRAVENOUS

## 2021-07-07 MED ORDER — HYDRALAZINE HCL 20 MG/ML IJ SOLN: 10.0000 mg | Freq: Once | INTRAMUSCULAR | Status: AC

## 2021-07-07 MED ORDER — OXYCODONE HCL 5 MG/5ML PO SOLN: 5.0000 mg | Freq: Once | ORAL | Status: DC | PRN

## 2021-07-07 MED ORDER — PHENYLEPHRINE HCL-NACL 20-0.9 MG/250ML-% IV SOLN
INTRAVENOUS | Status: DC | PRN
  Administered 2021-07-07: 50 ug/min via INTRAVENOUS

## 2021-07-07 SURGICAL SUPPLY — 54 items
ARMBAND PINK RESTRICT EXTREMIT (MISCELLANEOUS) ×3 IMPLANT
BAG BANDED W/RUBBER/TAPE 36X54 (MISCELLANEOUS) ×3 IMPLANT
BAG COUNTER SPONGE SURGICOUNT (BAG) ×3 IMPLANT
BALLN MUSTANG 6X80X75 (BALLOONS) ×3
BALLOON MUSTANG 6X80X75 (BALLOONS) IMPLANT
CANISTER SUCT 3000ML PPV (MISCELLANEOUS) ×3 IMPLANT
CATH BEACON 5 .035 65 KMP TIP (CATHETERS) ×1 IMPLANT
CLIP VESOCCLUDE MED 6/CT (CLIP) ×3 IMPLANT
CLIP VESOCCLUDE SM WIDE 6/CT (CLIP) ×3 IMPLANT
COVER DOME SNAP 22 D (MISCELLANEOUS) ×3 IMPLANT
COVER PROBE W GEL 5X96 (DRAPES) ×1 IMPLANT
COVER ULTRASOUND PROBE 36 ST (MISCELLANEOUS) ×1 IMPLANT
DERMABOND ADVANCED (GAUZE/BANDAGES/DRESSINGS) ×1
DERMABOND ADVANCED .7 DNX12 (GAUZE/BANDAGES/DRESSINGS) ×2 IMPLANT
DRSG TEGADERM 4X4.75 (GAUZE/BANDAGES/DRESSINGS) ×3 IMPLANT
ELECT REM PT RETURN 9FT ADLT (ELECTROSURGICAL) ×3
ELECTRODE REM PT RTRN 9FT ADLT (ELECTROSURGICAL) ×2 IMPLANT
GAUZE 4X4 16PLY ~~LOC~~+RFID DBL (SPONGE) ×1 IMPLANT
GLOVE SRG 8 PF TXTR STRL LF DI (GLOVE) ×2 IMPLANT
GLOVE SURG ENC MOIS LTX SZ7.5 (GLOVE) ×3 IMPLANT
GLOVE SURG UNDER POLY LF SZ8 (GLOVE) ×1
GOWN STRL REUS W/ TWL LRG LVL3 (GOWN DISPOSABLE) ×6 IMPLANT
GOWN STRL REUS W/ TWL XL LVL3 (GOWN DISPOSABLE) ×4 IMPLANT
GOWN STRL REUS W/TWL LRG LVL3 (GOWN DISPOSABLE) ×3
GOWN STRL REUS W/TWL XL LVL3 (GOWN DISPOSABLE) ×2
GUIDEWIRE BENTSON (WIRE) ×1 IMPLANT
HEMOSTAT SPONGE AVITENE ULTRA (HEMOSTASIS) IMPLANT
KIT BASIN OR (CUSTOM PROCEDURE TRAY) ×3 IMPLANT
KIT ENCORE 26 ADVANTAGE (KITS) ×1 IMPLANT
KIT TURNOVER KIT B (KITS) ×3 IMPLANT
NS IRRIG 1000ML POUR BTL (IV SOLUTION) ×3 IMPLANT
PACK CV ACCESS (CUSTOM PROCEDURE TRAY) ×3 IMPLANT
PAD ARMBOARD 7.5X6 YLW CONV (MISCELLANEOUS) ×6 IMPLANT
PATCH VASC XENOSURE 1CMX6CM (Vascular Products) ×1 IMPLANT
PATCH VASC XENOSURE 1X6 (Vascular Products) IMPLANT
SET MICROPUNCTURE 5F STIFF (MISCELLANEOUS) ×3 IMPLANT
SHEATH PINNACLE 8F 10CM (SHEATH) ×1 IMPLANT
SPONGE T-LAP 18X18 ~~LOC~~+RFID (SPONGE) ×1 IMPLANT
STOPCOCK MORSE 400PSI 3WAY (MISCELLANEOUS) ×2 IMPLANT
SUT MNCRL AB 4-0 PS2 18 (SUTURE) ×5 IMPLANT
SUT PROLENE 5 0 C 1 24 (SUTURE) IMPLANT
SUT PROLENE 6 0 BV (SUTURE) ×4 IMPLANT
SUT PROLENE 7 0 BV 1 (SUTURE) IMPLANT
SUT VIC AB 3-0 SH 27 (SUTURE) ×1
SUT VIC AB 3-0 SH 27X BRD (SUTURE) ×2 IMPLANT
SYR 10ML LL (SYRINGE) ×9 IMPLANT
SYR 20ML LL LF (SYRINGE) ×6 IMPLANT
SYR CONTROL 10ML LL (SYRINGE) ×3 IMPLANT
TOWEL GREEN STERILE (TOWEL DISPOSABLE) ×3 IMPLANT
TUBING CIL FLEX 10 FLL-RA (TUBING) ×3 IMPLANT
UNDERPAD 30X36 HEAVY ABSORB (UNDERPADS AND DIAPERS) ×3 IMPLANT
WATER STERILE IRR 1000ML POUR (IV SOLUTION) ×3 IMPLANT
WIRE BENTSON .035X145CM (WIRE) ×1 IMPLANT
WIRE ROSEN-J .035X260CM (WIRE) ×1 IMPLANT

## 2021-07-07 NOTE — Anesthesia Postprocedure Evaluation (Signed)
Anesthesia Post Note  Patient: Antonio Keller  Procedure(s) Performed: LEFT ARM REVISION OF ARTERIOVENOUS FISTULA WITH SIDE BRANCH LIGATION X2 (Left: Arm Upper) LEFT UPPER EXTREMITY FISTULOGRAM (Left: Arm Upper) PERIPHERAL ANGIOPLASTY (Left: Arm Upper) PATCH ANGIOPLASTY (Left: Arm Lower)     Patient location during evaluation: PACU Anesthesia Type: General Level of consciousness: awake and alert Pain management: pain level controlled Vital Signs Assessment: post-procedure vital signs reviewed and stable Respiratory status: spontaneous breathing, nonlabored ventilation and respiratory function stable Cardiovascular status: blood pressure returned to baseline and stable Postop Assessment: no apparent nausea or vomiting Anesthetic complications: no   No notable events documented.  Last Vitals:  Vitals:   07/07/21 1020 07/07/21 1035  BP: (!) 127/91 139/89  Pulse: 88 80  Resp: (!) 22 19  Temp:  36.6 C  SpO2: 100% 100%    Last Pain:  Vitals:   07/07/21 1035  TempSrc:   PainSc: 0-No pain                 Merlinda Frederick

## 2021-07-07 NOTE — Transfer of Care (Signed)
Immediate Anesthesia Transfer of Care Note  Patient: Antonio Keller  Procedure(s) Performed: LEFT ARM REVISION OF ARTERIOVENOUS FISTULA WITH SIDE BRANCH LIGATION X2 (Left: Arm Upper) LEFT UPPER EXTREMITY FISTULOGRAM (Left: Arm Upper) PERIPHERAL ANGIOPLASTY (Left: Arm Upper) PATCH ANGIOPLASTY (Left: Arm Lower)  Patient Location: PACU  Anesthesia Type:General  Level of Consciousness: awake, alert  and oriented  Airway & Oxygen Therapy: Patient connected to nasal cannula oxygen  Post-op Assessment: Post -op Vital signs reviewed and stable  Post vital signs: stable  Last Vitals:  Vitals Value Taken Time  BP 147/97 07/07/21 1004  Temp    Pulse 88 07/07/21 1005  Resp 19 07/07/21 1005  SpO2 99 % 07/07/21 1005  Vitals shown include unvalidated device data.  Last Pain:  Vitals:   07/07/21 0625  TempSrc: Oral  PainSc:       Patients Stated Pain Goal: 2 (16/10/96 0454)  Complications: No notable events documented.

## 2021-07-07 NOTE — Anesthesia Procedure Notes (Signed)
Procedure Name: LMA Insertion Date/Time: 07/07/2021 7:47 AM Performed by: Lavell Luster, CRNA Pre-anesthesia Checklist: Patient identified, Emergency Drugs available, Suction available, Patient being monitored and Timeout performed Patient Re-evaluated:Patient Re-evaluated prior to induction Oxygen Delivery Method: Circle system utilized Preoxygenation: Pre-oxygenation with 100% oxygen Induction Type: IV induction Ventilation: Mask ventilation without difficulty LMA: LMA inserted LMA Size: 4.0 Placement Confirmation: breath sounds checked- equal and bilateral and positive ETCO2 Tube secured with: Tape Dental Injury: Teeth and Oropharynx as per pre-operative assessment

## 2021-07-07 NOTE — H&P (Signed)
History and Physical Interval Note:  07/07/2021 7:39 AM  Antonio Keller  has presented today for surgery, with the diagnosis of END STAGE RENAL DISEASE.  The various methods of treatment have been discussed with the patient and family. After consideration of risks, benefits and other options for treatment, the patient has consented to  Procedure(s): LEFT ARM REVISION OF ARTERIOVENOUS FISTULA WITH SIDE BRANCH LIGATION (Left) FISTULOGRAM (Left) as a surgical intervention.  The patient's history has been reviewed, patient examined, no change in status, stable for surgery.  I have reviewed the patient's chart and labs.  Questions were answered to the patient's satisfaction.     Marty Heck  Patient name: Antonio Keller          MRN: [id number]        DOB: 22-Jan-1961          Sex: male   REASON FOR VISIT: Poorly maturing left arm AV fistula   HPI: Antonio Keller is a 61 y.o. male with end-stage renal disease on dialysis Monday Wednesday Friday that presents for evaluation of poorly maturing left arm AV fistula.  Patient had a left brachiocephalic AV fistula on 96/07/8364 with Dr. Virl Cagey and then underwent left upper extremity fistulogram on 05/21/2021 for poorly maturing AV fistula with angioplasty of the cephalic vein for both a peripheral and central stenosis.  He is currently using a right IJ tunneled catheter that has been in place since September of last year.  He has no history of left-sided catheters.       Past Medical History:  Diagnosis Date   A-fib ([place])     Anemia     Gout     Hepatitis B      learned of diagnosis 01/2021   History of blood transfusion 07/2020    [facility]   Hypertension     Renal disorder      dialysis M-W-F, stage 5   Systolic heart failure (HCC)     Thrombocytopenia [facility])             Past Surgical History:  Procedure Laterality Date   A/V FISTULAGRAM Left 05/21/2021    Procedure: A/V FISTULAGRAM;  Surgeon: Broadus John, MD;  Location: [facility];  Service: Cardiovascular;  Laterality: Left;   AV FISTULA PLACEMENT Left 04/01/2021    Procedure: LEFT ARM ARTERIOVENOUS (AV) FISTULA CREATION;  Surgeon: Broadus John, MD;  Location: [facility];  Service: Vascular;  Laterality: Left;   COLONOSCOPY       IR FLUORO GUIDE CV LINE RIGHT   02/15/2021   IR US GUIDE VASC ACCESS RIGHT   02/15/2021   PERIPHERAL VASCULAR BALLOON ANGIOPLASTY Left 05/21/2021    Procedure: PERIPHERAL VASCULAR BALLOON ANGIOPLASTY;  Surgeon: Broadus John, MD;  Location: [facility];  Service: Cardiovascular;  Laterality: Left;      No family history on file.   SOCIAL HISTORY: Social History        Tobacco Use   Smoking status: Never   Smokeless tobacco: Never  Substance Use Topics   Alcohol use: Not Currently      No Known Allergies         Current Outpatient Medications  Medication Sig Dispense Refill   allopurinol (ZYLOPRIM) 100 MG tablet Take 0.5 tablets (50 mg total) by mouth daily. (Patient taking differently: Take 100 mg by mouth daily.) 30 tablet 1   atorvastatin (LIPITOR) 40 MG tablet Take 1 tablet (40 mg total) by mouth daily.  90 tablet 1   carvedilol (COREG) 25 MG tablet Take 25 mg by mouth 2 (two) times daily.       chlorthalidone (HYGROTON) 25 MG tablet Take 25 mg by mouth daily.       colchicine 0.6 MG tablet Take 1 tablet (0.6 mg total) by mouth daily. 30 tablet 1   ferrous sulfate 325 (65 FE) MG tablet Take 1 tablet (325 mg total) by mouth daily. 90 tablet 3   isosorbide mononitrate (IMDUR) 30 MG 24 hr tablet Take 1 tablet (30 mg total) by mouth daily. 90 tablet 1   oxyCODONE-acetaminophen (PERCOCET) 5-325 MG tablet Take 1 tablet by mouth every 6 (six) hours as needed for severe pain. 8 tablet 0   senna-docusate (SENOKOT-S) 8.6-50 MG tablet Take 1 tablet by mouth 2 (two) times daily between meals as needed for mild constipation. 180 tablet 0   Vitamin D3 (VITAMIN D) 25 MCG tablet Take 1 tablet (1,000 Units total) by mouth daily.  (Patient taking differently: Take 1,000 Units by mouth every Monday, Wednesday, and Friday.) 30 tablet 0   apixaban (ELIQUIS) 5 MG TABS tablet Take 1 tablet (5 mg total) by mouth 2 (two) times daily. (Patient not taking: Reported on 06/24/2021) 180 tablet 3   calcitRIOL (ROCALTROL) 0.25 MCG capsule Take 1 capsule (0.25 mcg total) by mouth daily. 90 capsule 1   calcium acetate (PHOSLO) 667 MG capsule Take 2 capsules (1,334 mg total) by mouth 3 (three) times daily with meals. 270 capsule 1   calcium carbonate (OS-CAL - DOSED IN MG OF ELEMENTAL CALCIUM) 1250 (500 Ca) MG tablet Take 1 tablet (500 mg of elemental calcium total) by mouth 3 (three) times daily with meals. 270 tablet 1   hydrALAZINE (APRESOLINE) 50 MG tablet Take 50 mg by mouth 2 (two) times daily.        No current facility-administered medications for this visit.      REVIEW OF SYSTEMS:  [X]  denotes positive finding, [ ]  denotes negative finding Cardiac   Comments:  Chest pain or chest pressure:      Shortness of breath upon exertion:      Short of breath when lying flat:      Irregular heart rhythm:             Vascular      Pain in calf, thigh, or hip brought on by ambulation:      Pain in feet at night that wakes you up from your sleep:       Blood clot in your veins:      Leg swelling:              Pulmonary      Oxygen at home:      Productive cough:       Wheezing:              Neurologic      Sudden weakness in arms or legs:       Sudden numbness in arms or legs:       Sudden onset of difficulty speaking or slurred speech:      Temporary loss of vision in one eye:       Problems with dizziness:              Gastrointestinal      Blood in stool:       Vomited blood:              Genitourinary  Burning when urinating:       Blood in urine:             Psychiatric      Major depression:              Hematologic      Bleeding problems:      Problems with blood clotting too easily:             Skin       Rashes or ulcers:             Constitutional      Fever or chills:          PHYSICAL EXAM:    Vitals:    06/24/21 1608  BP: 120/78  Pulse: 71  Temp: 98.2 F (36.8 C)  Weight: 167 lb 8 oz (76 kg)  Height: 6' (1.829 m)      GENERAL: The patient is a well-nourished male, in no acute distress. The vital signs are documented above. CARDIAC: There is a regular rate and rhythm.  VASCULAR:  Left brachiocephalic fistula with a thrill that feels slightly pulsatile RIJ tunneled dialysis catheter PULMONARY: No respiratory distress. ABDOMEN: Soft and non-tender. MUSCULOSKELETAL: There are no major deformities or cyanosis. NEUROLOGIC: No focal weakness or paresthesias are detected. SKIN: There are no ulcers or rashes noted. PSYCHIATRIC: The patient has a normal affect.   DATA:    Left upper extremity fistula duplex shows a patent fistula that remains small measuring 3 to 4 mm in the mid to proximal upper arm.  There are several competing side branches that are 0.3 and 0.25 cm in size.   Assessment/Plan:   61 year old male presents with slow to mature left arm AV fistula.  This is a brachiocephalic placed by Dr. Virl Cagey and has undergone secondary intervention with balloon angioplasty for peripheral and central stenosis.  On exam he does have a thrill although it feels slightly pulsatile.  He does not have any significant arm swelling even in the setting of known central stenosis.  We discussed options of trying to continue to salvage this fistula in the left arm versus moving to the right arm.  I am concerned about immediately moving to the right arm as he has a tunneled catheter on the right and will be at high risk for central stenosis on the right as well.  I have ultimately recommended sidebranch ligation as he appears to have several competing branches on duplex and also seen on recent fistulogram and I will also perform additional balloon intervention at the time with a hybrid approach  in the OR.  Risk benefits discussed.  I think if this fails then we will be forced to move to the other arm.     Marty Heck, MD [facility] Office: [phone]

## 2021-07-07 NOTE — Discharge Instructions (Signed)
[facility]  Discharge Instructions  AV Fistula or Graft Surgery for Dialysis Access  Please refer to the following instructions for your post-procedure care. Your surgeon or physician assistant will discuss any changes with you.  Activity  You may drive the day following your surgery, if you are comfortable and no longer taking prescription pain medication. Resume full activity as the soreness in your incision resolves.  Bathing/Showering  You may shower after you go home. Keep your incision dry for 48 hours. Do not soak in a bathtub, hot tub, or swim until the incision heals completely. You may not shower if you have a hemodialysis catheter.  Incision Care  Clean your incision with mild soap and water after 48 hours. Pat the area dry with a clean towel. You do not need a bandage unless otherwise instructed. Do not apply any ointments or creams to your incision. You may have skin glue on your incision. Do not peel it off. It will come off on its own in about one week. Your arm may swell a bit after surgery. To reduce swelling use pillows to elevate your arm so it is above your heart. Your doctor will tell you if you need to lightly wrap your arm with an ACE bandage.  Diet  Resume your normal diet. There are not special food restrictions following this procedure. In order to heal from your surgery, it is CRITICAL to get adequate nutrition. Your body requires vitamins, minerals, and protein. Vegetables are the best source of vitamins and minerals. Vegetables also provide the perfect balance of protein. Processed food has little nutritional value, so try to avoid this.  Medications  Resume taking all of your medications. If your incision is causing pain, you may take over-the counter pain relievers such as acetaminophen (Tylenol). If you were prescribed a stronger pain medication, please be aware these medications can cause nausea and constipation. Prevent  nausea by taking the medication with a snack or meal. Avoid constipation by drinking plenty of fluids and eating foods with high amount of fiber, such as fruits, vegetables, and grains.  Do not take Tylenol if you are taking prescription pain medications.  Follow up Your surgeon may want to see you in the office following your access surgery. If so, this will be arranged at the time of your surgery.  Please call us immediately for any of the following conditions:  Increased pain, redness, drainage (pus) from your incision site Fever of 101 degrees or higher Severe or worsening pain at your incision site Hand pain or numbness.  Reduce your risk of vascular disease:  Stop smoking. If you would like help, call [facility] at 1-800-QUIT-NOW (718)424-6337) or [place] [facility] [place] your cholesterol Maintain a desired weight Control your diabetes Keep your blood pressure down  Dialysis  It will take several weeks to several months for your new dialysis access to be ready for use. Your surgeon will determine when it is okay to use it. Your nephrologist will continue to direct your dialysis. You can continue to use your Permcath until your new access is ready for use.   07/07/2021 Antonio Keller [id number] Oct 14, 1960  Surgeon(s): Marty Heck, MD  Procedure(s): LEFT ARM REVISION OF ARTERIOVENOUS FISTULA WITH SIDE BRANCH LIGATION X2 LEFT UPPER EXTREMITY FISTULOGRAM PERIPHERAL ANGIOPLASTY PATCH ANGIOPLASTY   May stick graft immediately   May stick graft on designated area only:   X Do not stick left AV fistula for 6 weeks  If you have any questions, please call the office at [phone].

## 2021-07-07 NOTE — Op Note (Signed)
Date: July 07, 2021  Preoperative diagnosis:  1.  End-stage renal disease  2.  Left arm brachiocephalic AV fistula with poor fistula maturation  Postoperative diagnosis: Same  Procedure: 1.  Left upper extremity fistulogram including central venogram 2.  Left cephalic vein angioplasty in the peripheral segment throughout the left proximal and mid upper arm (6 mm x 80 mm Mustang and 7 mm x 150 mm Mustang) 3.  Left upper extremity AV fistula revision including sidebranch ligation x2 and vein patch angioplasty using bovine pericardium   Surgeon: Dr. Marty Heck, MD  Assistant: Paulo Fruit, PA  Indications: Patient is a 61 year old male with end-stage renal disease that previously underwent a left brachiocephalic AV fistula with Dr. Unk Lightning.  This has been slow to mature and he has undergone balloon angioplasty of both central as well as peripheral stenosis.  He was recently seen in follow-up and his fistula duplex showed multiple large side branches in addition to a vein that remains small.  We discussed multiple options including going to the contralateral arm but he has chronic indwelling catheter on the right and will be high risk for stenosis on the contralateral side.  We recommended sidebranch ligation with additional peripheral intervention in the OR.  He presents today after his benefits discussed.  An assistant was needed for wire exchanges and to expedite the case.  Findings: Initial fistulogram showed multiple high grade stenotic segments throughout the mid to proximal upper arm cephalic vein with no evidence of recurrent central stenosis.  A short 8 French sheath was placed in the fistula and the entire proximal to mid cephalic vein was angioplastied with a 6 mm x 80 mm Mustang to nominal pressure for 2 minutes.  I then upsized to a 7 mm x 150 mm Mustang to try and get better results.  I had to reinflate the 7 mm Mustang balloon a second time given there was a stenotic  segment in the mid upper arm cephalic vein and this resulted in some extravasation.  There was no evidence of residual stenosis.  I then marked 2 large side branches in the distal and proximal upper arm and cut down on both of the segments to ligate the side branches.  In the proximal upper arm segment we encountered brisk bleeding from where the balloon angioplasty had torn the vein.  Ultimately we got control of this segment and this could not be repaired primarily and a bovine pericardial patch was placed.  Excellent thrill at completion.  Anesthesia: LMA  Details: Patient was taken to the operating room after informed consent was obtained.  Placed on the operative table in supine position and after anesthesia was induced the left arm was then prepped and draped in standard sterile fashion in OR 16.  A timeout was performed and the patient got preoperative antibiotics.  Initially accessed the fistula just above the antecubital fossa with a micro access needle and placed a microwire and then a microsheath.  I then used a Bentson wire and exchanged for a short 8 [place] sheath.  Patient was given 3000 units of IV heparin.  Attempted to advance a long Rosen wire but this would not cross the upper arm stenosis in the cephalic vein fistula.  I then used a Bentson wire that was advanced all the way into the central venous structures into the superior vena cava and exchanged with a KMP for a long Rosen wire.  I then used a 6 mm x 80 mm Mustang and  angioplastied the multiple stenotic segments in the proximal to mid upper arm cephalic vein to 2 minutes.  There was good results at completion and we elected to upsize to a 7 mm x 150 mm Mustang and angioplastied of the stenotic segments in the proximal to mid upper arm.  We had good results except for one stenotic segment that remained in the mid upper arm and I hit this a second time with a 7 mm balloon that did cause some extravasation.  There was no obvious hematoma on  the surface.  I saw no evidence of central stenosis.  I was satisfied with the results with no residual stenosis peripherally.  I then removed the sheath and tied down a 4-0 Monocryl pursestring.  I then used sterile ultrasound probe and marked 2 large side branches one in the distal and one in the proximal upper arm.  I made two small transverse incisions to cut down on the side branches and then these were identified and dissected out.  Initially identified the distal upper arm sidebranch and it was ligated with 2-0 silk ties and divided.  When I made my incision over the more proximal sidebranch we encountered pulsatile bleeding.  I extended my incision.  We had torn the fistula here with balloon angioplasty and ultimately I dissected down and got proximal distal control with fistula clamps.  Patient was then given an additional 3000 units of IV heparin.  There was a longitudinal tear in the cephalic vein and this could not be repaired primarily.  Small bovine pericardial patch was brought on the field and I used a 6-0 Prolene to repair this with parachute technique and we de-aired everything prior to completion.  Excellent thrill.  We then gave some protamine for reversal.  Both incisions were irrigated out and these were closed with 3-0 Vicryl 4-0 Monocryl and Dermabond.  Complication: None  Condition: Stable  Marty Heck, MD [facility] Office: [place]

## 2021-07-08 ENCOUNTER — Encounter (HOSPITAL_COMMUNITY): Payer: Self-pay | Admitting: Vascular Surgery

## 2021-07-12 LAB — CBC WITH DIFFERENTIAL/PLATELET
Basophils Absolute: 0 10*3/uL (ref 0.0–0.2)
Basos: 0 %
EOS (ABSOLUTE): 0.1 10*3/uL (ref 0.0–0.4)
Eos: 2 %
Hematocrit: 31.9 % — ABNORMAL LOW (ref 37.5–51.0)
Hemoglobin: 10.7 g/dL — ABNORMAL LOW (ref 13.0–17.7)
Immature Grans (Abs): 0 10*3/uL (ref 0.0–0.1)
Immature Granulocytes: 0 %
Lymphocytes Absolute: 2.5 10*3/uL (ref 0.7–3.1)
Lymphs: 49 %
MCH: 28.9 pg (ref 26.6–33.0)
MCHC: 33.5 g/dL (ref 31.5–35.7)
MCV: 86 fL (ref 79–97)
Monocytes Absolute: 0.5 10*3/uL (ref 0.1–0.9)
Monocytes: 10 %
Neutrophils Absolute: 2 10*3/uL (ref 1.4–7.0)
Neutrophils: 39 %
Platelets: 80 10*3/uL — CL (ref 150–450)
RBC: 3.7 x10E6/uL — ABNORMAL LOW (ref 4.14–5.80)
RDW: 14.5 % (ref 11.6–15.4)
WBC: 5 10*3/uL (ref 3.4–10.8)

## 2021-07-24 ENCOUNTER — Encounter: Payer: Self-pay | Admitting: *Deleted

## 2021-07-28 ENCOUNTER — Other Ambulatory Visit: Payer: Self-pay

## 2021-07-28 DIAGNOSIS — N186 End stage renal disease: Secondary | ICD-10-CM

## 2021-08-05 ENCOUNTER — Ambulatory Visit (HOSPITAL_COMMUNITY)
Admission: RE | Admit: 2021-08-05 | Discharge: 2021-08-05 | Disposition: A | Discharge status: Home / Self Care | Payer: BC Managed Care – PPO | Source: Ambulatory Visit | Attending: Vascular Surgery | Admitting: Vascular Surgery

## 2021-08-05 ENCOUNTER — Other Ambulatory Visit: Payer: Self-pay

## 2021-08-05 ENCOUNTER — Ambulatory Visit (INDEPENDENT_AMBULATORY_CARE_PROVIDER_SITE_OTHER): Payer: BC Managed Care – PPO | Admitting: Physician Assistant

## 2021-08-05 VITALS — BP 139/93 | HR 69 | Temp 97.7°F | Resp 18 | Ht 72.0 in | Wt 162.7 lb

## 2021-08-05 DIAGNOSIS — N186 End stage renal disease: Secondary | ICD-10-CM | POA: Diagnosis not present

## 2021-08-05 NOTE — Progress Notes (Signed)
?POST OPERATIVE OFFICE NOTE ? ? ? ?CC:  F/u for surgery ? ?HPI:  This is a 61 y.o. male who is s/p left brachiocephalic fistula creation and November 2022 by Dr. Unk Lightning.  He required fistulogram with balloon angioplasty of the cephalic vein due to slow to mature fistula.  He then required an additional revision by Dr. Carlis Abbott with sidebranch ligation as well as balloon angioplasty.  Extravasation was noted during fistulogram thus Dr. Carlis Abbott cut down on the fistula and patched the fistula with a bovine pericardial patch.  Patient continues to dialyze via right IJ [facility] on a Monday Wednesday Friday schedule at the [facility] location.  He denies any signs or symptoms of steal syndrome in his left hand ? ?No Known Allergies ? ?Current Outpatient Medications  ?Medication Sig Dispense Refill  ? allopurinol (ZYLOPRIM) 100 MG tablet Take 0.5 tablets (50 mg total) by mouth daily. (Patient taking differently: Take 100 mg by mouth daily.) 30 tablet 1  ? amiodarone (PACERONE) 200 MG tablet Take 1 tablet (200 mg total) by mouth daily. 90 tablet 3  ? atorvastatin (LIPITOR) 40 MG tablet Take 1 tablet (40 mg total) by mouth daily. 90 tablet 1  ? AURYXIA 1 GM 210 MG(Fe) tablet Take 420 mg by mouth 3 (three) times daily.    ? carvedilol (COREG) 25 MG tablet Take 25 mg by mouth 2 (two) times daily.    ? chlorthalidone (HYGROTON) 25 MG tablet Take 25 mg by mouth daily.    ? colchicine 0.6 MG tablet Take 1 tablet (0.6 mg total) by mouth daily. (Patient taking differently: Take 0.6 mg by mouth daily as needed.) 30 tablet 1  ? ferrous sulfate 325 (65 FE) MG tablet Take 1 tablet (325 mg total) by mouth daily. 90 tablet 3  ? isosorbide mononitrate (IMDUR) 30 MG 24 hr tablet Take 1 tablet (30 mg total) by mouth daily. 90 tablet 1  ? Vitamin D3 (VITAMIN D) 25 MCG tablet Take 1 tablet (1,000 Units total) by mouth daily. (Patient taking differently: Take 1,000 Units by mouth every Monday, Wednesday, and Friday.) 30 tablet 0  ? apixaban  (ELIQUIS) 5 MG TABS tablet Take 1 tablet (5 mg total) by mouth 2 (two) times daily. (Patient not taking: Reported on 08/05/2021) 180 tablet 3  ? calcitRIOL (ROCALTROL) 0.25 MCG capsule Take 1 capsule (0.25 mcg total) by mouth daily. (Patient not taking: Reported on 08/05/2021) 90 capsule 1  ? hydrALAZINE (APRESOLINE) 50 MG tablet Take 50 mg by mouth 2 (two) times daily. (Patient not taking: Reported on 08/05/2021)    ? oxyCODONE-acetaminophen (PERCOCET) 5-325 MG tablet Take 1 tablet by mouth every 6 (six) hours as needed for severe pain. (Patient not taking: Reported on 08/05/2021) 12 tablet 0  ? ?No current facility-administered medications for this visit.  ? ? ? ROS:  See HPI ? ?Physical Exam: ? ?Vitals:  ? 08/05/21 1048  ?BP: (!) 139/93  ?Pulse: 69  ?Resp: 18  ?Temp: 97.7 ?F (36.5 ?C)  ?TempSrc: Temporal  ?SpO2: 99%  ?Weight: 162 lb 11.2 oz (73.8 kg)  ?Height: 6' (1.829 m)  ? ? ?Incision: Left arm incision well-healed ?Extremities:   Easily palpable thrill mid upper arm however difficult to palpate a thrill near the arterial anastomosis; palpable radial pulse; 5 out of 5 grip strength ? ?Assessment/Plan:  This is a 61 y.o. male who is s/p: ?Revision of left brachiocephalic fistula with sidebranch ligation, balloon angioplasty, and patch angioplasty with bovine pericardial patch ? ?-  Despite another intervention on left brachiocephalic fistula the diameter remains at a maximum of 4.5 mm.  He also has less than 100 mL/min flow volume.  Dr. Carlis Abbott also evaluated the patient with me today.  He explained to the patient and his wife that the fistula still remains small in diameter and has sluggish flow.  Despite the findings of the duplex, there is still an easily palpable thrill in the mid upper arm.  Plan will be to allow the kidney center to cannulate fistula.  If this is unsuccessful patient was given option to repeat fistulogram versus proceeding with a brand-new access in his left arm.  Patient and wife agreed to call the  office if cannulation of fistula is unsuccessful and they will be scheduled for left basilic vein transposition versus left arm AV graft. ? ? ?Okay to cannulate left arm fistula at dialysis tomorrow 3/8 ? ?Dagoberto Ligas, PA-C ?[facility] ?[phone] ? ?Clinic MD:  Carlis Abbott ? ?

## 2021-08-11 ENCOUNTER — Telehealth: Payer: Self-pay

## 2021-08-11 NOTE — Telephone Encounter (Signed)
Pt's wife called to schedule pt's surgery. I have spoken with Antony Madura at HD center who has pt there today and she said this was his 2nd time using this access. They are going to continue to keep an eye on it and if any issues arise where an intervention/surgery is necessary, she will update our office.  ?

## 2021-08-28 ENCOUNTER — Other Ambulatory Visit: Payer: Self-pay | Admitting: Gastroenterology

## 2021-08-28 DIAGNOSIS — B1910 Unspecified viral hepatitis B without hepatic coma: Secondary | ICD-10-CM

## 2021-09-01 ENCOUNTER — Ambulatory Visit
Admission: RE | Admit: 2021-09-01 | Discharge: 2021-09-01 | Disposition: A | Discharge status: Home / Self Care | Payer: BC Managed Care – PPO | Source: Ambulatory Visit | Attending: Gastroenterology | Admitting: Gastroenterology

## 2021-09-01 DIAGNOSIS — B1910 Unspecified viral hepatitis B without hepatic coma: Secondary | ICD-10-CM

## 2021-09-15 ENCOUNTER — Ambulatory Visit: Payer: BC Managed Care – PPO | Admitting: Family

## 2021-09-16 ENCOUNTER — Ambulatory Visit: Payer: BC Managed Care – PPO | Admitting: Family

## 2021-09-21 NOTE — Progress Notes (Signed)
?Cardiology Office Note:   ? ?Date:  09/24/2021  ? ?ID:  Antonio Keller, DOB [birth year], MRN [id number] ? ?PCP:  Jolinda Croak, MD  ?Cardiologist:  Donato Heinz, MD  ?Electrophysiologist:  None  ? ?Referring MD: Jolinda Croak, MD  ? ?Chief Complaint  ?Patient presents with  ? Congestive Heart Failure  ? ? ? ?History of Present Illness:   ? ?Antonio Keller is a 61 y.o. male with a hx of paroxysmal atrial fibrillation, chronic combined systolic and diastolic heart failure, ESRD, HTN who presents for follow-up.  He was first seen in the office on 08/23/2020.  He was ESRD but at the time had not started on dialysis.  Had been referred for chest pain evaluation.  Lexiscan Myoview was ordered but he did not follow-up.  He was admitted in 01/2021 to [facility] with progression of his kidney disease requiring starting hemodialysis.  He was found to have atrial fibrillation with RVR and new systolic heart failure.  He was started on amiodarone and converted to sinus rhythm.  Plan was for amnio 400 mg x 7 days, then 200 mg x 21 days.  Anticoagulation was attempted but he became anemic and thrombocytopenic, and was discontinued.  Echocardiogram showed EF 40 to 45%, along with severe concentric LVH.  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 ? ?Echocardiogram 02/15/2021 showed EF 40 to 45%, severe concentric LVH, grade 2 diastolic dysfunction, normal RV function, severe left atrial dilatation, mild right atrial dilatation, mild MR, mild AI, mild aortic root dilatation measuring 41 mm.  Lexiscan Myoview on 03/27/2021 showed normal perfusion, EF 46%. ? ?Since last clinic visit, he reports he has been doing okay.  He had BRBPR after starting Eliquis.  Stop taking Eliquis.  Denies any chest pain, dyspnea, lower extremity edema, or palpitations.  Has been having some lightheadedness and hypotension on dialysis days.  Stopped taking all his antihypertensives. ? ? ?Past Medical History:  ?Diagnosis Date  ? A-fib ([place])   ? Anemia    ? Gout   ? Hepatitis B   ? learned of diagnosis 01/2021  ? History of blood transfusion 07/2020  ? [facility]  ? Hypertension   ? Renal disorder   ? dialysis M-W-F, stage 5  ? Systolic heart failure ([place])   ? Thrombocytopenia ([place])   ? ? ?Current Medications: ?Current Meds  ?Medication Sig  ? allopurinol (ZYLOPRIM) 100 MG tablet Take 0.5 tablets (50 mg total) by mouth daily. (Patient taking differently: Take 100 mg by mouth daily.)  ? amiodarone (PACERONE) 200 MG tablet Take 1 tablet (200 mg total) by mouth daily.  ? atorvastatin (LIPITOR) 40 MG tablet Take 1 tablet (40 mg total) by mouth daily.  ? AURYXIA 1 GM 210 MG(Fe) tablet Take 420 mg by mouth 3 (three) times daily.  ? calcitRIOL (ROCALTROL) 0.25 MCG capsule Take 1 capsule (0.25 mcg total) by mouth daily.  ? colchicine 0.6 MG tablet Take 1 tablet (0.6 mg total) by mouth daily. (Patient taking differently: Take 0.6 mg by mouth daily as needed.)  ? ferrous sulfate 325 (65 FE) MG tablet Take 1 tablet (325 mg total) by mouth daily.  ? metoprolol succinate (TOPROL XL) 25 MG 24 hr tablet Take 1 tablet (25 mg total) by mouth daily.  ? oxyCODONE-acetaminophen (PERCOCET) 5-325 MG tablet Take 1 tablet by mouth every 6 (six) hours as needed for severe pain.  ? Vitamin D3 (VITAMIN D) 25 MCG tablet Take 1 tablet (1,000 Units total) by mouth daily. (Patient taking differently: Take  1,000 Units by mouth every Monday, Wednesday, and Friday.)  ? [DISCONTINUED] apixaban (ELIQUIS) 5 MG TABS tablet Take 1 tablet (5 mg total) by mouth 2 (two) times daily.  ? [DISCONTINUED] carvedilol (COREG) 25 MG tablet Take 25 mg by mouth 2 (two) times daily.  ? [DISCONTINUED] chlorthalidone (HYGROTON) 25 MG tablet Take 25 mg by mouth daily.  ? [DISCONTINUED] hydrALAZINE (APRESOLINE) 50 MG tablet Take 50 mg by mouth 2 (two) times daily.  ? [DISCONTINUED] isosorbide mononitrate (IMDUR) 30 MG 24 hr tablet Take 1 tablet (30 mg total) by mouth daily.  ?  ? ?Allergies:   Patient has no known allergies.   ? ?Social History  ? ?Socioeconomic History  ? Marital status: Married  ?  Spouse name: Not on file  ? Number of children: Not on file  ? Years of education: Not on file  ? Highest education level: Not on file  ?Occupational History  ? Not on file  ?Tobacco Use  ? Smoking status: Never  ? Smokeless tobacco: Never  ?Vaping Use  ? Vaping Use: Never used  ?Substance and Sexual Activity  ? Alcohol use: Not Currently  ? Drug use: Never  ? Sexual activity: Yes  ?  Partners: Female  ?Other Topics Concern  ? Not on file  ?Social History Narrative  ? Not on file  ? ?Social Determinants of Health  ? ?Financial Resource Strain: Not on file  ?Food Insecurity: Not on file  ?Transportation Needs: Not on file  ?Physical Activity: Not on file  ?Stress: Not on file  ?Social Connections: Not on file  ?  ? ?Family History: ?No history of heart disease in his immediate family ? ?ROS:   ?Please see the history of present illness.    ?All other systems reviewed and are negative. ? ?EKGs/Labs/Other Studies Reviewed:   ? ?The following studies were reviewed today: ? ?EKG:   ?09/23/21: NSR, rate 75, LVH with repol, QTC 486 ? ?Recent Labs: ?02/17/2021: Magnesium 1.7 ?09/23/2021: ALT 10; BUN 37; Creatinine, Ser 13.33; Hemoglobin 10.4; Platelets 120; Potassium WILL FOLLOW; Sodium 136; TSH 2.130  ?Recent Lipid Panel ?   ?Component Value Date/Time  ? TRIG 130 02/15/2021 0618  ? ? ?Physical Exam:   ? ?VS:  BP (!) 152/96   Pulse 75   Ht 6' (1.829 m)   Wt 166 lb 3.2 oz (75.4 kg)   SpO2 98%   BMI 22.54 kg/m?    ? ?Wt Readings from Last 3 Encounters:  ?09/23/21 166 lb 3.2 oz (75.4 kg)  ?08/05/21 162 lb 11.2 oz (73.8 kg)  ?07/07/21 164 lb 3.9 oz (74.5 kg)  ?  ? ?GEN: Well nourished, well developed in no acute distress ?HEENT: Normal ?NECK: No JVD; No carotid bruits ?CARDIAC: RRR, no murmurs, rubs, gallops ?RESPIRATORY:  Clear to auscultation without rales, wheezing or rhonchi  ?ABDOMEN: Soft, non-tender, non-distended ?MUSCULOSKELETAL:  No edema;  No deformity  ?SKIN: Warm and dry ?NEUROLOGIC:  Alert and oriented x 3 ?PSYCHIATRIC:  Normal affect  ? ?ASSESSMENT:   ? ?1. Chronic combined systolic (congestive) and diastolic (congestive) heart failure (HCC)   ?2. Essential hypertension   ?3. ESRD on dialysis [facility])   ?4. PAF (paroxysmal atrial fibrillation) ([place])   ?5. Pancytopenia ([place])   ? ? ? ? ?PLAN:   ? ?Chronic combined systolic and diastolic heart failure: Echocardiogram 02/15/2021 showed EF 40 to 45%, severe concentric LVH, grade 2 diastolic dysfunction, normal RV function, severe left atrial dilatation, mild right atrial dilatation,  mild MR, mild AI, mild aortic root dilatation measuring 41 mm.  Lexiscan Myoview on 03/27/2021 showed normal perfusion, EF 46%. ?-Stopped taking carvedilol, hydralazine, and Imdur due to low BP during HD.  Will start Toprol-XL 25 mg daily ?-Not on ACE/ARB/Arni/MRA given ESRD ?-Volume management via HD ?-Check echocardiogram ? ?Atrial fibrillation: CHA2DS2-VASc score 2 (hypertension, CHF)  ?-Anticoagulation was attempted during hospitalization 01/2021 but developed bleeding due to anemia and thrombocytopenia, anticoagulation held ?-His anemia/thrombocytopenia have improved, d/w his hematologist Dr Julien Nordmann and [place] with rechallenging with Eliquis, started 5mg  BID in January 2023.  Subsequently reported he had 2 episodes of hematochezia and Eliquis was stopped.  He was referred to GI.  Will obtain records from GI to see if he is a candidate for anticoagulation moving forward ?-Continue amiodarone to maintain sinus rhythm while off Eliquis ?-Start Toprol-XL 25 mg ? ?Hypertension: Previously on hydralazine 100 mg twice daily, carvedilol 25 mg twice daily, Imdur 30 mg daily.  Currently off of all medications since BP running low in HD.  BP elevated in clinic today, will add Toprol-XL 25 mg daily and continue to monitor BP during HD ? ?ESRD: On HD ? ?Pancytopenia: Follows with hematology, has improved, most recent platelet count 80 on  07/11/2021.  Was down to 63s during admission 01/2021 ? ? ?RTC in 3 months ? ? ? ? ?Medication Adjustments/Labs and Tests Ordered: ?Current medicines are reviewed at length with the patient today.  Concerns regardi

## 2021-09-23 ENCOUNTER — Ambulatory Visit: Payer: BC Managed Care – PPO | Admitting: Cardiology

## 2021-09-23 ENCOUNTER — Encounter: Payer: Self-pay | Admitting: Cardiology

## 2021-09-23 VITALS — BP 152/96 | HR 75 | Ht 72.0 in | Wt 166.2 lb

## 2021-09-23 DIAGNOSIS — Z992 Dependence on renal dialysis: Secondary | ICD-10-CM

## 2021-09-23 DIAGNOSIS — I10 Essential (primary) hypertension: Secondary | ICD-10-CM | POA: Diagnosis not present

## 2021-09-23 DIAGNOSIS — I5042 Chronic combined systolic (congestive) and diastolic (congestive) heart failure: Secondary | ICD-10-CM | POA: Diagnosis not present

## 2021-09-23 DIAGNOSIS — I480 Paroxysmal atrial fibrillation: Secondary | ICD-10-CM | POA: Diagnosis not present

## 2021-09-23 DIAGNOSIS — N186 End stage renal disease: Secondary | ICD-10-CM | POA: Diagnosis not present

## 2021-09-23 DIAGNOSIS — D61818 Other pancytopenia: Secondary | ICD-10-CM

## 2021-09-23 MED ORDER — METOPROLOL SUCCINATE ER 25 MG PO TB24: 25.0000 mg | ORAL_TABLET | Freq: Every day | ORAL | 3 refills | Status: AC

## 2021-09-23 NOTE — Patient Instructions (Signed)
Medication Instructions:  ?START metoprolol succinate (Toprol XL) 25 mg daily ? ?*If you need a refill on your cardiac medications before your next appointment, please call your pharmacy* ? ? ?Lab Work: ?CMET, CBC, TSH today ? ?If you have labs (blood work) drawn today and your tests are completely normal, you will receive your results only by: ?MyChart Message (if you have MyChart) OR ?A paper copy in the mail ?If you have any lab test that is abnormal or we need to change your treatment, we will call you to review the results. ? ? ?Testing/Procedures: ?Your physician has requested that you have an echocardiogram. Echocardiography is a painless test that uses sound waves to create images of your heart. It provides your doctor with information about the size and shape of your heart and how well your heart?s chambers and valves are working. This procedure takes approximately one hour. There are no restrictions for this procedure. ?This will be done at our [place] location:  [address] ? ?Follow-Up: ?At [facility], you and your health needs are our priority.  As part of our continuing mission to provide you with exceptional heart care, we have created designated Provider Care Teams.  These Care Teams include your primary Cardiologist (physician) and Advanced Practice Providers (APPs -  Physician Assistants and Nurse Practitioners) who all work together to provide you with the care you need, when you need it. ? ?We recommend signing up for the patient portal called "MyChart".  Sign up information is provided on this After Visit Summary.  MyChart is used to connect with patients for Virtual Visits (Telemedicine).  Patients are able to view lab/test results, encounter notes, upcoming appointments, etc.  Non-urgent messages can be sent to your provider as well.   ?To learn more about what you can do with MyChart, go to [email or web address].   ? ?Your next appointment:   ?3 month(s) ? ?The  format for your next appointment:   ?In Person ? ?Provider:   ?Donato Heinz, MD { ? ? ? ?Important Information About [place] ? ? ? ? ? ? ?

## 2021-09-25 LAB — CBC
Hematocrit: 31.5 % — ABNORMAL LOW (ref 37.5–51.0)
Hemoglobin: 10.4 g/dL — ABNORMAL LOW (ref 13.0–17.7)
MCH: 29.9 pg (ref 26.6–33.0)
MCHC: 33 g/dL (ref 31.5–35.7)
MCV: 91 fL (ref 79–97)
Platelets: 120 10*3/uL — ABNORMAL LOW (ref 150–450)
RBC: 3.48 x10E6/uL — ABNORMAL LOW (ref 4.14–5.80)
RDW: 17.2 % — ABNORMAL HIGH (ref 11.6–15.4)
WBC: 5.8 10*3/uL (ref 3.4–10.8)

## 2021-09-25 LAB — COMPREHENSIVE METABOLIC PANEL
ALT: 10 IU/L (ref 0–44)
AST: 13 IU/L (ref 0–40)
Albumin/Globulin Ratio: 1.4 (ref 1.2–2.2)
Albumin: 4.7 g/dL (ref 3.8–4.9)
Alkaline Phosphatase: 104 IU/L (ref 44–121)
BUN/Creatinine Ratio: 3 — ABNORMAL LOW (ref 10–24)
BUN: 37 mg/dL — ABNORMAL HIGH (ref 8–27)
Bilirubin Total: 0.4 mg/dL (ref 0.0–1.2)
CO2: 26 mmol/L (ref 20–29)
Calcium: 10.3 mg/dL — ABNORMAL HIGH (ref 8.6–10.2)
Chloride: 94 mmol/L — ABNORMAL LOW (ref 96–106)
Creatinine, Ser: 13.33 mg/dL — ABNORMAL HIGH (ref 0.76–1.27)
Globulin, Total: 3.3 g/dL (ref 1.5–4.5)
Glucose: 95 mg/dL (ref 70–99)
Sodium: 136 mmol/L (ref 134–144)
Total Protein: 8 g/dL (ref 6.0–8.5)
eGFR: 4 mL/min/{1.73_m2} — ABNORMAL LOW (ref >59)

## 2021-09-25 LAB — TSH: TSH: 2.13 u[IU]/mL (ref 0.450–4.500)

## 2021-10-02 ENCOUNTER — Encounter: Payer: Self-pay | Admitting: *Deleted

## 2021-10-02 ENCOUNTER — Ambulatory Visit (HOSPITAL_COMMUNITY): Payer: BC Managed Care – PPO | Attending: Cardiology

## 2021-10-02 DIAGNOSIS — I5042 Chronic combined systolic (congestive) and diastolic (congestive) heart failure: Secondary | ICD-10-CM

## 2021-10-02 LAB — ECHOCARDIOGRAM COMPLETE
Area-P 1/2: 2.39 cm2
S' Lateral: 3 cm

## 2021-10-14 ENCOUNTER — Telehealth: Payer: Self-pay

## 2021-10-14 NOTE — Telephone Encounter (Signed)
Pt's mother called to schedule pt. She states he has not been able to have HD for several days due to no access. She has been offered an appt this week to see MD but she prefers to wait until next tues. To see MD that they are familiar with here. Pt has u/s scheduled for this week and to see MD next and she is aware of these appt dates and times. No further questions at this time. ?

## 2021-10-15 ENCOUNTER — Other Ambulatory Visit: Payer: Self-pay

## 2021-10-15 DIAGNOSIS — N186 End stage renal disease: Secondary | ICD-10-CM

## 2021-10-16 ENCOUNTER — Ambulatory Visit (HOSPITAL_COMMUNITY)
Admission: RE | Admit: 2021-10-16 | Discharge: 2021-10-16 | Disposition: A | Discharge status: Home / Self Care | Payer: BC Managed Care – PPO | Source: Ambulatory Visit | Attending: Vascular Surgery | Admitting: Vascular Surgery

## 2021-10-16 ENCOUNTER — Ambulatory Visit (INDEPENDENT_AMBULATORY_CARE_PROVIDER_SITE_OTHER)
Admission: RE | Admit: 2021-10-16 | Discharge: 2021-10-16 | Disposition: A | Discharge status: Home / Self Care | Payer: BC Managed Care – PPO | Source: Ambulatory Visit | Attending: Vascular Surgery | Admitting: Vascular Surgery

## 2021-10-16 DIAGNOSIS — N186 End stage renal disease: Secondary | ICD-10-CM | POA: Diagnosis present

## 2021-10-21 ENCOUNTER — Ambulatory Visit (INDEPENDENT_AMBULATORY_CARE_PROVIDER_SITE_OTHER): Payer: BC Managed Care – PPO | Admitting: Vascular Surgery

## 2021-10-21 ENCOUNTER — Encounter: Payer: Self-pay | Admitting: Vascular Surgery

## 2021-10-21 VITALS — BP 103/68 | HR 67 | Temp 98.2°F | Resp 16 | Ht 72.0 in | Wt 165.0 lb

## 2021-10-21 DIAGNOSIS — N186 End stage renal disease: Secondary | ICD-10-CM

## 2021-10-21 NOTE — Progress Notes (Signed)
Patient name: Antonio Keller MRN: [id number] DOB: April 09, 1961 Sex: male  REASON FOR VISIT: Thrombosed left brachiocephalic AV fistula  HPI: Antonio Keller is a 61 y.o. male with multiple medical problems including atrial fibrillation, hypertension, end-stage renal disease that presents for evaluation of thrombosed left brachiocephalic AV fistula.  Patient initially had a left brachiocephalic AV fistula placed by Dr. Virl Cagey on 04/01/2021.  This was ultimately slow to mature and on 05/21/2021 he underwent angioplasty of the cephalic vein in multiple locations.  I then took him back for additional revision on 07/07/2021 including sidebranch ligation with vein patch angioplasty and additional cephalic vein angioplasty.  He states after my recent intervention the fistula worked well for a while and then it was infiltrated.  It is now known to be thrombosed.  He is right-handed.  Currently has a right IJ TDC.  Not on anticoagulation for his A-fib.  Past Medical History:  Diagnosis Date   A-fib ([place])    Anemia    Gout    Hepatitis B    learned of diagnosis 01/2021   History of blood transfusion 07/2020   [facility]   Hypertension    Renal disorder    dialysis M-W-F, stage 5   Systolic heart failure (HCC)    Thrombocytopenia [facility])     Past Surgical History:  Procedure Laterality Date   A/V FISTULAGRAM Left 05/21/2021   Procedure: A/V FISTULAGRAM;  Surgeon: Broadus John, MD;  Location: [place] [facility];  Service: Cardiovascular;  Laterality: Left;   ANGIOPLASTY Left 07/07/2021   Procedure: PERIPHERAL ANGIOPLASTY;  Surgeon: Marty Heck, MD;  Location: [place];  Service: Vascular;  Laterality: Left;   AV FISTULA PLACEMENT Left 04/01/2021   Procedure: LEFT ARM ARTERIOVENOUS (AV) FISTULA CREATION;  Surgeon: Broadus John, MD;  Location: [place];  Service: Vascular;  Laterality: Left;   COLONOSCOPY     FISTULOGRAM Left 07/07/2021   Procedure: LEFT UPPER EXTREMITY FISTULOGRAM;  Surgeon: Marty Heck, MD;  Location: [place];  Service: Vascular;  Laterality: Left;   IR FLUORO GUIDE CV LINE RIGHT  02/15/2021   IR US GUIDE VASC ACCESS RIGHT  02/15/2021   PATCH ANGIOPLASTY Left 07/07/2021   Procedure: PATCH ANGIOPLASTY;  Surgeon: Marty Heck, MD;  Location: [place];  Service: Vascular;  Laterality: Left;   PERIPHERAL VASCULAR BALLOON ANGIOPLASTY Left 05/21/2021   Procedure: PERIPHERAL VASCULAR BALLOON ANGIOPLASTY;  Surgeon: Broadus John, MD;  Location: [place] [facility];  Service: Cardiovascular;  Laterality: Left;   REVISON OF ARTERIOVENOUS FISTULA Left 07/07/2021   Procedure: LEFT ARM REVISION OF ARTERIOVENOUS FISTULA WITH SIDE BRANCH LIGATION X2;  Surgeon: Marty Heck, MD;  Location: [place];  Service: Vascular;  Laterality: Left;    History reviewed. No pertinent family history.  SOCIAL HISTORY: Social History   Tobacco Use   Smoking status: Never   Smokeless tobacco: Never  Substance Use Topics   Alcohol use: Not Currently    No Known Allergies  Current Outpatient Medications  Medication Sig Dispense Refill   allopurinol (ZYLOPRIM) 100 MG tablet Take 0.5 tablets (50 mg total) by mouth daily. (Patient taking differently: Take 100 mg by mouth daily.) 30 tablet 1   amiodarone (PACERONE) 200 MG tablet Take 1 tablet (200 mg total) by mouth daily. 90 tablet 3   atorvastatin (LIPITOR) 40 MG tablet Take 1 tablet (40 mg total) by mouth daily. 90 tablet 1   AURYXIA 1 GM 210 MG(Fe) tablet Take 420 mg  by mouth 3 (three) times daily.     calcitRIOL (ROCALTROL) 0.25 MCG capsule Take 1 capsule (0.25 mcg total) by mouth daily. 90 capsule 1   colchicine 0.6 MG tablet Take 1 tablet (0.6 mg total) by mouth daily. (Patient taking differently: Take 0.6 mg by mouth daily as needed.) 30 tablet 1   ferrous sulfate 325 (65 FE) MG tablet Take 1 tablet (325 mg total) by mouth daily. 90 tablet 3   metoprolol succinate (TOPROL XL) 25 MG 24 hr tablet Take 1 tablet (25 mg total) by  mouth daily. 90 tablet 3   Vitamin D3 (VITAMIN D) 25 MCG tablet Take 1 tablet (1,000 Units total) by mouth daily. (Patient taking differently: Take 1,000 Units by mouth every Monday, Wednesday, and Friday.) 30 tablet 0   oxyCODONE-acetaminophen (PERCOCET) 5-325 MG tablet Take 1 tablet by mouth every 6 (six) hours as needed for severe pain. (Patient not taking: Reported on 10/21/2021) 12 tablet 0   No current facility-administered medications for this visit.    REVIEW OF SYSTEMS:  [X]  denotes positive finding, [ ]  denotes negative finding Cardiac  Comments:  Chest pain or chest pressure:    Shortness of breath upon exertion:    Short of breath when lying flat:    Irregular heart rhythm:        Vascular    Pain in calf, thigh, or hip brought on by ambulation:    Pain in feet at night that wakes you up from your sleep:     Blood clot in your veins:    Leg swelling:         Pulmonary    Oxygen at home:    Productive cough:     Wheezing:         Neurologic    Sudden weakness in arms or legs:     Sudden numbness in arms or legs:     Sudden onset of difficulty speaking or slurred speech:    Temporary loss of vision in one eye:     Problems with dizziness:         Gastrointestinal    Blood in stool:     Vomited blood:         Genitourinary    Burning when urinating:     Blood in urine:        Psychiatric    Major depression:         Hematologic    Bleeding problems:    Problems with blood clotting too easily:        Skin    Rashes or ulcers:        Constitutional    Fever or chills:      PHYSICAL EXAM: Vitals:   10/21/21 1440  BP: 103/68  Pulse: 67  Resp: 16  Temp: 98.2 F (36.8 C)  TempSrc: Temporal  SpO2: 98%  Weight: 165 lb (74.8 kg)  Height: 6' (1.829 m)    GENERAL: The patient is a well-nourished male, in no acute distress. The vital signs are documented above. CARDIAC: There is a regular rate and rhythm.  VASCULAR:  Palpable radial and brachial  pulses bilateral upper extremities Right IJ tunnel catheter PULMONARY: No respiratory distress. ABDOMEN: Soft and non-tender. MUSCULOSKELETAL: There are no major deformities or cyanosis. NEUROLOGIC: No focal weakness or paresthesias are detected. SKIN: There are no ulcers or rashes noted. PSYCHIATRIC: The patient has a normal affect.  DATA:   Vein mapping today shows a usable basilic vein  in the left arm which is his nondominant arm.  The cephalic vein in his left arm is thrombosed  Assessment/Plan:  61 year old male with end-stage renal disease on hemodialysis Monday Wednesday Friday that presents for evaluation of new access.  He previously had a left brachiocephalic AV fistula placed by Dr. Virl Cagey last year that has had multiple attempts at endovascular and open salvage that is now thrombosed.  He does have a usable basilic vein in the left arm.  I have offered him a left upper arm basilic vein fistula.  Discussed this is likely done in two stages.  Ultimately he has dialysis on Monday Wednesday Friday and I have offered the option of rescheduling his dialysis so I can perform his fistula or having it done with one of my partners since I am not in the [place] on Tuesday or Thursday.  He would prefer to have it done on Tuesday or Thursday.  I will schedule him with Dr. Virl Cagey who has been his previous surgeon.  All questions answered.   Marty Heck, MD [facility] Office: [phone]

## 2021-10-21 NOTE — H&P (View-Only) (Signed)
Patient name: Antonio Keller MRN: [id number] DOB: 1960/08/08 Sex: male  REASON FOR VISIT: Thrombosed left brachiocephalic AV fistula  HPI: Antonio Keller is a 61 y.o. male with multiple medical problems including atrial fibrillation, hypertension, end-stage renal disease that presents for evaluation of thrombosed left brachiocephalic AV fistula.  Patient initially had a left brachiocephalic AV fistula placed by Dr. Virl Cagey on 04/01/2021.  This was ultimately slow to mature and on 05/21/2021 he underwent angioplasty of the cephalic vein in multiple locations.  I then took him back for additional revision on 07/07/2021 including sidebranch ligation with vein patch angioplasty and additional cephalic vein angioplasty.  He states after my recent intervention the fistula worked well for a while and then it was infiltrated.  It is now known to be thrombosed.  He is right-handed.  Currently has a right IJ TDC.  Not on anticoagulation for his A-fib.  Past Medical History:  Diagnosis Date   A-fib ([place])    Anemia    Gout    Hepatitis B    learned of diagnosis 01/2021   History of blood transfusion 07/2020   [facility]   Hypertension    Renal disorder    dialysis M-W-F, stage 5   Systolic heart failure (HCC)    Thrombocytopenia [facility])     Past Surgical History:  Procedure Laterality Date   A/V FISTULAGRAM Left 05/21/2021   Procedure: A/V FISTULAGRAM;  Surgeon: Broadus John, MD;  Location: [place] [facility];  Service: Cardiovascular;  Laterality: Left;   ANGIOPLASTY Left 07/07/2021   Procedure: PERIPHERAL ANGIOPLASTY;  Surgeon: Marty Heck, MD;  Location: [place];  Service: Vascular;  Laterality: Left;   AV FISTULA PLACEMENT Left 04/01/2021   Procedure: LEFT ARM ARTERIOVENOUS (AV) FISTULA CREATION;  Surgeon: Broadus John, MD;  Location: [place];  Service: Vascular;  Laterality: Left;   COLONOSCOPY     FISTULOGRAM Left 07/07/2021   Procedure: LEFT UPPER EXTREMITY FISTULOGRAM;  Surgeon: Marty Heck, MD;  Location: [place];  Service: Vascular;  Laterality: Left;   IR FLUORO GUIDE CV LINE RIGHT  02/15/2021   IR US GUIDE VASC ACCESS RIGHT  02/15/2021   PATCH ANGIOPLASTY Left 07/07/2021   Procedure: PATCH ANGIOPLASTY;  Surgeon: Marty Heck, MD;  Location: [place];  Service: Vascular;  Laterality: Left;   PERIPHERAL VASCULAR BALLOON ANGIOPLASTY Left 05/21/2021   Procedure: PERIPHERAL VASCULAR BALLOON ANGIOPLASTY;  Surgeon: Broadus John, MD;  Location: [place] [facility];  Service: Cardiovascular;  Laterality: Left;   REVISON OF ARTERIOVENOUS FISTULA Left 07/07/2021   Procedure: LEFT ARM REVISION OF ARTERIOVENOUS FISTULA WITH SIDE BRANCH LIGATION X2;  Surgeon: Marty Heck, MD;  Location: [place];  Service: Vascular;  Laterality: Left;    History reviewed. No pertinent family history.  SOCIAL HISTORY: Social History   Tobacco Use   Smoking status: Never   Smokeless tobacco: Never  Substance Use Topics   Alcohol use: Not Currently    No Known Allergies  Current Outpatient Medications  Medication Sig Dispense Refill   allopurinol (ZYLOPRIM) 100 MG tablet Take 0.5 tablets (50 mg total) by mouth daily. (Patient taking differently: Take 100 mg by mouth daily.) 30 tablet 1   amiodarone (PACERONE) 200 MG tablet Take 1 tablet (200 mg total) by mouth daily. 90 tablet 3   atorvastatin (LIPITOR) 40 MG tablet Take 1 tablet (40 mg total) by mouth daily. 90 tablet 1   AURYXIA 1 GM 210 MG(Fe) tablet Take 420 mg  by mouth 3 (three) times daily.     calcitRIOL (ROCALTROL) 0.25 MCG capsule Take 1 capsule (0.25 mcg total) by mouth daily. 90 capsule 1   colchicine 0.6 MG tablet Take 1 tablet (0.6 mg total) by mouth daily. (Patient taking differently: Take 0.6 mg by mouth daily as needed.) 30 tablet 1   ferrous sulfate 325 (65 FE) MG tablet Take 1 tablet (325 mg total) by mouth daily. 90 tablet 3   metoprolol succinate (TOPROL XL) 25 MG 24 hr tablet Take 1 tablet (25 mg total) by  mouth daily. 90 tablet 3   Vitamin D3 (VITAMIN D) 25 MCG tablet Take 1 tablet (1,000 Units total) by mouth daily. (Patient taking differently: Take 1,000 Units by mouth every Monday, Wednesday, and Friday.) 30 tablet 0   oxyCODONE-acetaminophen (PERCOCET) 5-325 MG tablet Take 1 tablet by mouth every 6 (six) hours as needed for severe pain. (Patient not taking: Reported on 10/21/2021) 12 tablet 0   No current facility-administered medications for this visit.    REVIEW OF SYSTEMS:  [X]  denotes positive finding, [ ]  denotes negative finding Cardiac  Comments:  Chest pain or chest pressure:    Shortness of breath upon exertion:    Short of breath when lying flat:    Irregular heart rhythm:        Vascular    Pain in calf, thigh, or hip brought on by ambulation:    Pain in feet at night that wakes you up from your sleep:     Blood clot in your veins:    Leg swelling:         Pulmonary    Oxygen at home:    Productive cough:     Wheezing:         Neurologic    Sudden weakness in arms or legs:     Sudden numbness in arms or legs:     Sudden onset of difficulty speaking or slurred speech:    Temporary loss of vision in one eye:     Problems with dizziness:         Gastrointestinal    Blood in stool:     Vomited blood:         Genitourinary    Burning when urinating:     Blood in urine:        Psychiatric    Major depression:         Hematologic    Bleeding problems:    Problems with blood clotting too easily:        Skin    Rashes or ulcers:        Constitutional    Fever or chills:      PHYSICAL EXAM: Vitals:   10/21/21 1440  BP: 103/68  Pulse: 67  Resp: 16  Temp: 98.2 F (36.8 C)  TempSrc: Temporal  SpO2: 98%  Weight: 165 lb (74.8 kg)  Height: 6' (1.829 m)    GENERAL: The patient is a well-nourished male, in no acute distress. The vital signs are documented above. CARDIAC: There is a regular rate and rhythm.  VASCULAR:  Palpable radial and brachial  pulses bilateral upper extremities Right IJ tunnel catheter PULMONARY: No respiratory distress. ABDOMEN: Soft and non-tender. MUSCULOSKELETAL: There are no major deformities or cyanosis. NEUROLOGIC: No focal weakness or paresthesias are detected. SKIN: There are no ulcers or rashes noted. PSYCHIATRIC: The patient has a normal affect.  DATA:   Vein mapping today shows a usable basilic vein  in the left arm which is his nondominant arm.  The cephalic vein in his left arm is thrombosed  Assessment/Plan:  61 year old male with end-stage renal disease on hemodialysis Monday Wednesday Friday that presents for evaluation of new access.  He previously had a left brachiocephalic AV fistula placed by Dr. Virl Cagey last year that has had multiple attempts at endovascular and open salvage that is now thrombosed.  He does have a usable basilic vein in the left arm.  I have offered him a left upper arm basilic vein fistula.  Discussed this is likely done in two stages.  Ultimately he has dialysis on Monday Wednesday Friday and I have offered the option of rescheduling his dialysis so I can perform his fistula or having it done with one of my partners since I am not in the [place] on Tuesday or Thursday.  He would prefer to have it done on Tuesday or Thursday.  I will schedule him with Dr. Virl Cagey who has been his previous surgeon.  All questions answered.   Marty Heck, MD [facility] [place] Office: [phone]

## 2021-10-22 ENCOUNTER — Other Ambulatory Visit: Payer: Self-pay

## 2021-10-22 DIAGNOSIS — N186 End stage renal disease: Secondary | ICD-10-CM

## 2021-10-28 ENCOUNTER — Other Ambulatory Visit: Payer: Self-pay

## 2021-10-28 ENCOUNTER — Encounter (HOSPITAL_COMMUNITY): Payer: Self-pay | Admitting: Vascular Surgery

## 2021-10-28 NOTE — Progress Notes (Signed)
PCP - Jolinda Croak, MD Cardiologist -   Donato Heinz, MD   EKG - 09/23/21 Chest x-ray -  ECHO - 10/02/21 Cardiac Cath -  CPAP -   ERAS Protcol -  COVID TEST-   Anesthesia review: yes  -------------  SDW INSTRUCTIONS:  Your procedure is scheduled on 6/1. Please report to [facility] Main Entrance "A" at 0830 A.M., and check in at the Admitting office. Call this number if you have problems the morning of surgery: [phone]   Remember: Do not eat or drink after midnight the night before your surgery   Medications to take morning of surgery with a sip of water include: amiodarone (PACERONE)  atorvastatin (LIPITOR)  metoprolol succinate   As of today, STOP taking any Aspirin (unless otherwise instructed by your surgeon), Aleve, Naproxen, Ibuprofen, Motrin, Advil, Goody's, BC's, all herbal medications, fish oil, and all vitamins.    The Morning of Surgery Do not wear jewelry Do not wear lotions, powders, colognes, or deodorant Do not bring valuables to the hospital. [facility] is not responsible for any belongings or valuables.  If you are a smoker, DO NOT Smoke 24 hours prior to surgery  If you wear a CPAP at night please bring your mask the morning of surgery   Remember that you must have someone to transport you home after your surgery, and remain with you for 24 hours if you are discharged the same day.  Please bring cases for contacts, glasses, hearing aids, dentures or bridgework because it cannot be worn into surgery.   Patients discharged the day of surgery will not be allowed to drive home.   Please shower the NIGHT BEFORE/MORNING OF SURGERY (use antibacterial soap like DIAL soap if possible). Wear comfortable clothes the morning of surgery. Oral Hygiene is also important to reduce your risk of infection.  Remember - BRUSH YOUR TEETH THE MORNING OF SURGERY WITH YOUR REGULAR TOOTHPASTE  Patient denies shortness of breath, fever, cough and chest  pain.

## 2021-10-29 NOTE — Progress Notes (Signed)
Patient was called and notified that he must be at the hospital tomorrow morning for surgery at 05:30 o'clock instead at 08:30. Patient verbalized understanding.

## 2021-10-29 NOTE — Anesthesia Preprocedure Evaluation (Addendum)
Anesthesia Evaluation  Patient identified by MRN, date of birth, ID band Patient awake    Reviewed: Allergy & Precautions, NPO status , Patient's Chart, lab work & pertinent test results  Airway Mallampati: II  TM Distance: >3 FB Neck ROM: Full    Dental no notable dental hx.    Pulmonary neg pulmonary ROS,    Pulmonary exam normal breath sounds clear to auscultation       Cardiovascular hypertension, Pt. on medications and Pt. on home beta blockers +CHF  Normal cardiovascular exam Rhythm:Regular Rate:Normal  Echo 09/2021 1. Left ventricular ejection fraction, by estimation, is 60 to 65%. The left ventricle has normal function. The left ventricle has no regional wall motion abnormalities. There is mild concentric left ventricular hypertrophy. Left ventricular diastolic parameters are consistent with Grade I diastolic dysfunction (impaired relaxation).  2. Right ventricular systolic function is normal. The right ventricular size is normal. Tricuspid regurgitation signal is inadequate for assessing PA pressure.  3. The mitral valve is normal in structure. Trivial mitral valve regurgitation. No evidence of mitral stenosis.  4. The aortic valve is tricuspid. Aortic valve regurgitation is trivial. Aortic valve sclerosis is present, with no evidence of aortic valve stenosis.  5. Aortic dilatation noted. There is mild dilatation of the aortic root, measuring 40 mm.  6. The inferior vena cava is normal in size with greater than 50% respiratory variability, suggesting right atrial pressure of 3 mmHg.    Neuro/Psych negative neurological ROS     GI/Hepatic negative GI ROS, (+) Hepatitis -  Endo/Other  negative endocrine ROS  Renal/GU Renal disease     Musculoskeletal negative musculoskeletal ROS (+)   Abdominal   Peds  Hematology  (+) Blood dyscrasia, anemia ,   Anesthesia Other Findings   Reproductive/Obstetrics                            Anesthesia Physical Anesthesia Plan  ASA: 3  Anesthesia Plan: General   Post-op Pain Management: Tylenol PO (pre-op)*   Induction: Intravenous  PONV Risk Score and Plan: 1 and Ondansetron, Dexamethasone, Treatment may vary due to age or medical condition and Propofol infusion  Airway Management Planned: LMA  Additional Equipment:   Intra-op Plan:   Post-operative Plan: Extubation in OR  Informed Consent: I have reviewed the patients History and Physical, chart, labs and discussed the procedure including the risks, benefits and alternatives for the proposed anesthesia with the patient or authorized representative who has indicated his/her understanding and acceptance.     Dental advisory given  Plan Discussed with: CRNA  Anesthesia Plan Comments: (Pt requests GA  PAT note by Karoline Caldwell, PA-C:  Follows with cardiology for history of accessible atrial fibrillation (off Eliquis due to 2 episodes of hematochezia in the setting of thrombocytopenia),chronic combined heart failure, HTN.Most recent echo 10/02/21 showed LVEF normalized to 60-65%, mild concentric LVH, grade 1 diastolic dysfunction, normal RV function, mild aortic root dilatation measuring 40 mm.Lexiscan Myoview on 03/27/2021 showed normal perfusion, EF 46%.  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 This has been improving. Last platelets up to 120k and Hgb stable at 10.4 on 09/23/21.  ESRD on HD Monday Wednesday Friday currently dialyzing through right IJ tunnel catheter. He previously had a left brachiocephalic AV fistula placed by Dr. Virl Cagey last year thathas had multiple attempts at endovascular and open salvage that is now thrombosed.  Patient will need day of  surgery labs and evaluation.  EKG 09/23/21: Sinus rhythm. Rate 75. Prolonged PR interval. LVH.  Nuclear stress test 03/27/21: . Findings are  consistent with no ischemia. The study is intermediate risk. . No ST deviation was noted. . LV perfusion is normal. There is no evidence of ischemia. There is no evidence of infarction. . Left ventricular function is abnormal. Nuclear stress EF: 46 %. The left ventricular ejection fraction is mildly decreased (45-54%). End diastolic cavity size is severely enlarged. End systolic cavity size is severely enlarged. . Prior study not available for comparison.  Echo 10/02/21 1. Left ventricular ejection fraction, by estimation, is 60 to 65%. The  left ventricle has normal function. The left ventricle has no regional  wall motion abnormalities. There is mild concentric left ventricular  hypertrophy. Left ventricular diastolic  parameters are consistent with Grade I diastolic dysfunction (impaired  relaxation).  2. Right ventricular systolic function is normal. The right ventricular  size is normal. Tricuspid regurgitation signal is inadequate for assessing  PA pressure.  3. The mitral valve is normal in structure. Trivial mitral valve  regurgitation. No evidence of mitral stenosis.  4. The aortic valve is tricuspid. Aortic valve regurgitation is trivial.  Aortic valve sclerosis is present, with no evidence of aortic valve  stenosis.  5. Aortic dilatation noted. There is mild dilatation of the aortic root,  measuring 40 mm.  6. The inferior vena cava is normal in size with greater than 50%  respiratory variability, suggesting right atrial pressure of 3 mmHg. )      Anesthesia Quick Evaluation

## 2021-10-29 NOTE — Progress Notes (Signed)
Anesthesia Chart Review: Same day workup  Follows with cardiology for history of accessible atrial fibrillation (off Eliquis due to 2 episodes of hematochezia in the setting of thrombocytopenia), chronic combined heart failure, HTN. Most recent echo 10/02/21 showed LVEF normalized to 60-65%, mild concentric LVH, grade 1 diastolic dysfunction, normal RV function, mild aortic root dilatation measuring 40 mm.  Lexiscan Myoview on 03/27/2021 showed normal perfusion, EF 46%.   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 This has been improving. Last platelets up to 120k and Hgb stable at 10.4 on 09/23/21.   ESRD on HD Monday Wednesday Friday currently dialyzing through right IJ tunnel catheter. He previously had a left brachiocephalic AV fistula placed by Dr. Virl Cagey last year that has had multiple attempts at endovascular and open salvage that is now thrombosed.   Patient will need day of surgery labs and evaluation.   EKG 09/23/21: Sinus rhythm.  Rate 75.  Prolonged PR interval.  LVH.   Nuclear stress test 03/27/21:   Findings are consistent with no ischemia. The study is intermediate risk.   No ST deviation was noted.   LV perfusion is normal. There is no evidence of ischemia. There is no evidence of infarction.   Left ventricular function is abnormal. Nuclear stress EF: 46 %. The left ventricular ejection fraction is mildly decreased (45-54%). End diastolic cavity size is severely enlarged. End systolic cavity size is severely enlarged.   Prior study not available for comparison.   Echo 10/02/21  1. Left ventricular ejection fraction, by estimation, is 60 to 65%. The  left ventricle has normal function. The left ventricle has no regional  wall motion abnormalities. There is mild concentric left ventricular  hypertrophy. Left ventricular diastolic  parameters are consistent with Grade I diastolic dysfunction (impaired  relaxation).   2. Right  ventricular systolic function is normal. The right ventricular  size is normal. Tricuspid regurgitation signal is inadequate for assessing  PA pressure.   3. The mitral valve is normal in structure. Trivial mitral valve  regurgitation. No evidence of mitral stenosis.   4. The aortic valve is tricuspid. Aortic valve regurgitation is trivial.  Aortic valve sclerosis is present, with no evidence of aortic valve  stenosis.   5. Aortic dilatation noted. There is mild dilatation of the aortic root,  measuring 40 mm.   6. The inferior vena cava is normal in size with greater than 50%  respiratory variability, suggesting right atrial pressure of 3 mmHg.     Wynonia Musty [facility] Short Stay Center/Anesthesiology Phone [phone] 10/29/2021 10:02 AM

## 2021-10-30 ENCOUNTER — Ambulatory Visit (HOSPITAL_COMMUNITY): Payer: BC Managed Care – PPO | Admitting: Emergency Medicine

## 2021-10-30 ENCOUNTER — Encounter (HOSPITAL_COMMUNITY)
Admission: RE | Disposition: A | Discharge status: Home / Self Care | Payer: Self-pay | Source: Home / Self Care | Attending: Surgery

## 2021-10-30 ENCOUNTER — Ambulatory Visit (HOSPITAL_COMMUNITY)
Admission: RE | Admit: 2021-10-30 | Discharge: 2021-10-30 | Disposition: A | Discharge status: Home / Self Care | Payer: BC Managed Care – PPO | Attending: Surgery | Admitting: Surgery

## 2021-10-30 ENCOUNTER — Other Ambulatory Visit: Payer: Self-pay

## 2021-10-30 DIAGNOSIS — Z992 Dependence on renal dialysis: Secondary | ICD-10-CM | POA: Diagnosis not present

## 2021-10-30 DIAGNOSIS — I4891 Unspecified atrial fibrillation: Secondary | ICD-10-CM | POA: Diagnosis not present

## 2021-10-30 DIAGNOSIS — I120 Hypertensive chronic kidney disease with stage 5 chronic kidney disease or end stage renal disease: Secondary | ICD-10-CM | POA: Insufficient documentation

## 2021-10-30 DIAGNOSIS — N186 End stage renal disease: Secondary | ICD-10-CM | POA: Insufficient documentation

## 2021-10-30 DIAGNOSIS — N185 Chronic kidney disease, stage 5: Secondary | ICD-10-CM | POA: Diagnosis not present

## 2021-10-30 HISTORY — PX: AV FISTULA PLACEMENT: SHX1204

## 2021-10-30 LAB — POCT I-STAT, CHEM 8
BUN: 45 mg/dL — ABNORMAL HIGH (ref 6–20)
Calcium, Ion: 1.21 mmol/L (ref 1.15–1.40)
Chloride: 99 mmol/L (ref 98–111)
Creatinine, Ser: 13.4 mg/dL — ABNORMAL HIGH (ref 0.61–1.24)
Glucose, Bld: 116 mg/dL — ABNORMAL HIGH (ref 70–99)
HCT: 44 % (ref 39.0–52.0)
Hemoglobin: 15 g/dL (ref 13.0–17.0)
Potassium: 4.8 mmol/L (ref 3.5–5.1)
Sodium: 138 mmol/L (ref 135–145)
TCO2: 27 mmol/L (ref 22–32)

## 2021-10-30 SURGERY — ARTERIOVENOUS (AV) FISTULA CREATION
Anesthesia: General | Site: Arm Upper | Laterality: Left

## 2021-10-30 MED ORDER — HEPARIN 6000 UNIT IRRIGATION SOLUTION
Status: DC | PRN
  Administered 2021-10-30: 1

## 2021-10-30 MED ORDER — 0.9 % SODIUM CHLORIDE (POUR BTL) OPTIME
TOPICAL | Status: DC | PRN
  Administered 2021-10-30: 1000 mL

## 2021-10-30 MED ORDER — MIDAZOLAM HCL 2 MG/2ML IJ SOLN
INTRAMUSCULAR | Status: AC
  Filled 2021-10-30: qty 2

## 2021-10-30 MED ORDER — ONDANSETRON HCL 4 MG/2ML IJ SOLN
INTRAMUSCULAR | Status: DC | PRN
  Administered 2021-10-30: 4 mg via INTRAVENOUS

## 2021-10-30 MED ORDER — OXYCODONE HCL 5 MG PO TABS: 5.0000 mg | ORAL_TABLET | Freq: Once | ORAL | Status: DC | PRN

## 2021-10-30 MED ORDER — CHLORHEXIDINE GLUCONATE 0.12 % MT SOLN
15.0000 mL | Freq: Once | OROMUCOSAL | Status: AC
  Administered 2021-10-30: 15 mL via OROMUCOSAL
  Filled 2021-10-30: qty 15

## 2021-10-30 MED ORDER — FENTANYL CITRATE (PF) 100 MCG/2ML IJ SOLN: 25.0000 ug | INTRAMUSCULAR | Status: DC | PRN

## 2021-10-30 MED ORDER — DEXAMETHASONE SODIUM PHOSPHATE 10 MG/ML IJ SOLN
INTRAMUSCULAR | Status: DC | PRN
  Administered 2021-10-30: 10 mg via INTRAVENOUS

## 2021-10-30 MED ORDER — AMISULPRIDE (ANTIEMETIC) 5 MG/2ML IV SOLN: 10.0000 mg | Freq: Once | INTRAVENOUS | Status: DC | PRN

## 2021-10-30 MED ORDER — HEPARIN 6000 UNIT IRRIGATION SOLUTION
Status: AC
  Filled 2021-10-30: qty 500

## 2021-10-30 MED ORDER — PROPOFOL 10 MG/ML IV BOLUS
INTRAVENOUS | Status: DC | PRN
  Administered 2021-10-30: 140 mg via INTRAVENOUS

## 2021-10-30 MED ORDER — ORAL CARE MOUTH RINSE: 15.0000 mL | Freq: Once | OROMUCOSAL | Status: AC

## 2021-10-30 MED ORDER — PHENYLEPHRINE HCL-NACL 20-0.9 MG/250ML-% IV SOLN
INTRAVENOUS | Status: DC | PRN
  Administered 2021-10-30: 50 ug/min via INTRAVENOUS

## 2021-10-30 MED ORDER — PHENYLEPHRINE 80 MCG/ML (10ML) SYRINGE FOR IV PUSH (FOR BLOOD PRESSURE SUPPORT)
PREFILLED_SYRINGE | INTRAVENOUS | Status: DC | PRN
  Administered 2021-10-30 (×2): 80 ug via INTRAVENOUS

## 2021-10-30 MED ORDER — STERILE WATER FOR IRRIGATION IR SOLN
Status: DC | PRN
  Administered 2021-10-30: 1000 mL

## 2021-10-30 MED ORDER — OXYCODONE HCL 5 MG/5ML PO SOLN: 5.0000 mg | Freq: Once | ORAL | Status: DC | PRN

## 2021-10-30 MED ORDER — FENTANYL CITRATE (PF) 250 MCG/5ML IJ SOLN
INTRAMUSCULAR | Status: DC | PRN
  Administered 2021-10-30 (×2): 25 ug via INTRAVENOUS

## 2021-10-30 MED ORDER — LIDOCAINE-EPINEPHRINE (PF) 1 %-1:200000 IJ SOLN
INTRAMUSCULAR | Status: AC
  Filled 2021-10-30: qty 30

## 2021-10-30 MED ORDER — CHLORHEXIDINE GLUCONATE 4 % EX LIQD: 60.0000 mL | Freq: Once | CUTANEOUS | Status: DC

## 2021-10-30 MED ORDER — SODIUM CHLORIDE 0.9 % IV SOLN: INTRAVENOUS | Status: DC

## 2021-10-30 MED ORDER — PROPOFOL 10 MG/ML IV BOLUS
INTRAVENOUS | Status: AC
  Filled 2021-10-30: qty 20

## 2021-10-30 MED ORDER — OXYCODONE-ACETAMINOPHEN 5-325 MG PO TABS: 1.0000 | ORAL_TABLET | Freq: Four times a day (QID) | ORAL | 0 refills | Status: DC | PRN

## 2021-10-30 MED ORDER — ACETAMINOPHEN 500 MG PO TABS
1000.0000 mg | ORAL_TABLET | Freq: Once | ORAL | Status: AC
  Administered 2021-10-30: 1000 mg via ORAL
  Filled 2021-10-30: qty 2

## 2021-10-30 MED ORDER — LIDOCAINE 2% (20 MG/ML) 5 ML SYRINGE
INTRAMUSCULAR | Status: DC | PRN
  Administered 2021-10-30: 80 mg via INTRAVENOUS

## 2021-10-30 MED ORDER — FENTANYL CITRATE (PF) 250 MCG/5ML IJ SOLN
INTRAMUSCULAR | Status: AC
  Filled 2021-10-30: qty 5

## 2021-10-30 MED ORDER — MIDAZOLAM HCL 2 MG/2ML IJ SOLN
INTRAMUSCULAR | Status: DC | PRN
  Administered 2021-10-30: 1 mg via INTRAVENOUS

## 2021-10-30 MED ORDER — CHLORHEXIDINE GLUCONATE 4 % EX LIQD
60.0000 mL | Freq: Once | CUTANEOUS | Status: DC
Start: 2021-10-31 — End: 2021-11-01

## 2021-10-30 MED ORDER — EPHEDRINE SULFATE-NACL 50-0.9 MG/10ML-% IV SOSY
PREFILLED_SYRINGE | INTRAVENOUS | Status: DC | PRN
  Administered 2021-10-30: 10 mg via INTRAVENOUS
  Administered 2021-10-30 (×3): 5 mg via INTRAVENOUS

## 2021-10-30 MED ORDER — CEFAZOLIN SODIUM-DEXTROSE 2-4 GM/100ML-% IV SOLN
2.0000 g | INTRAVENOUS | Status: AC
  Administered 2021-10-30: 2 g via INTRAVENOUS
  Filled 2021-10-30: qty 100

## 2021-10-30 SURGICAL SUPPLY — 29 items
ARMBAND PINK RESTRICT EXTREMIT (MISCELLANEOUS) ×4 IMPLANT
BAG COUNTER SPONGE SURGICOUNT (BAG) ×2 IMPLANT
CANISTER SUCT 3000ML PPV (MISCELLANEOUS) ×2 IMPLANT
CLIP VESOCCLUDE MED 6/CT (CLIP) ×2 IMPLANT
CLIP VESOCCLUDE SM WIDE 6/CT (CLIP) ×2 IMPLANT
COVER PROBE W GEL 5X96 (DRAPES) ×2 IMPLANT
COVER PROBE W/GEL STERILE (IV SETS) ×1 IMPLANT
DERMABOND ADVANCED (GAUZE/BANDAGES/DRESSINGS) ×1
DERMABOND ADVANCED .7 DNX12 (GAUZE/BANDAGES/DRESSINGS) ×1 IMPLANT
ELECT REM PT RETURN 9FT ADLT (ELECTROSURGICAL) ×2
ELECTRODE REM PT RTRN 9FT ADLT (ELECTROSURGICAL) ×1 IMPLANT
GLOVE SURG SS PI 7.5 STRL IVOR (GLOVE) ×6 IMPLANT
GOWN STRL REUS W/ TWL LRG LVL3 (GOWN DISPOSABLE) ×2 IMPLANT
GOWN STRL REUS W/ TWL XL LVL3 (GOWN DISPOSABLE) ×1 IMPLANT
GOWN STRL REUS W/TWL LRG LVL3 (GOWN DISPOSABLE) ×2
GOWN STRL REUS W/TWL XL LVL3 (GOWN DISPOSABLE) ×1
KIT BASIN OR (CUSTOM PROCEDURE TRAY) ×2 IMPLANT
KIT TURNOVER KIT B (KITS) ×2 IMPLANT
NS IRRIG 1000ML POUR BTL (IV SOLUTION) ×2 IMPLANT
PACK CV ACCESS (CUSTOM PROCEDURE TRAY) ×2 IMPLANT
PAD ARMBOARD 7.5X6 YLW CONV (MISCELLANEOUS) ×4 IMPLANT
SUT PROLENE 6 0 BV (SUTURE) ×1 IMPLANT
SUT PROLENE 6 0 CC (SUTURE) ×3 IMPLANT
SUT VIC AB 3-0 SH 27 (SUTURE) ×1
SUT VIC AB 3-0 SH 27X BRD (SUTURE) ×1 IMPLANT
SUT VICRYL 4-0 PS2 18IN ABS (SUTURE) ×2 IMPLANT
TOWEL GREEN STERILE (TOWEL DISPOSABLE) ×2 IMPLANT
UNDERPAD 30X36 HEAVY ABSORB (UNDERPADS AND DIAPERS) ×2 IMPLANT
WATER STERILE IRR 1000ML POUR (IV SOLUTION) ×2 IMPLANT

## 2021-10-30 NOTE — Interval H&P Note (Signed)
History and Physical Interval Note:  10/30/2021 7:27 AM  Antonio Keller  has presented today for surgery, with the diagnosis of ESRD.  The various methods of treatment have been discussed with the patient and family. After consideration of risks, benefits and other options for treatment, the patient has consented to  Procedure(s) with comments: LEFT ARM ARTERIOVENOUS (AV) FISTULA CREATION (Left) - PERIPHERAL NERVE BLOCK as a surgical intervention.  The patient's history has been reviewed, patient examined, no change in status, stable for surgery.  I have reviewed the patient's chart and labs.  Questions were answered to the patient's satisfaction.     Annamarie Major

## 2021-10-30 NOTE — Op Note (Signed)
    Patient name: Antonio Keller MRN: [id number] DOB: 01-21-1961 Sex: male  10/30/2021 Pre-operative Diagnosis: ESRD Post-operative diagnosis:  Same Surgeon:  Annamarie Major Assistants:  Vicente Serene Procedure:   First stage left basilic vein fistula creation Anesthesia:  General Blood Loss:  minimal Specimens:  none  Findings: Excellent 5 mm vein  Indications: This is a 61 year old gentleman with end-stage renal disease on dialysis via a catheter.  He has a history of a failed left brachiocephalic fistula.  He comes in today for new access.  Procedure:  The patient was identified in the holding area and taken to [facility] [address]  The patient was then placed supine on the table. general anesthesia was administered.  The patient was prepped and draped in the usual sterile fashion.  A time out was called and antibiotics were administered.  A PA was necessary to expedite the procedure and assist with technical details.  Ultrasound was used to map the course of basilic vein in the left arm.  It was of excellent caliber.  An oblique incision was then made just proximal to the antecubital crease.  Cautery was used divide subcutaneous tissue.  I then dissected out the brachial artery which was a 4 mm disease-free artery.  It was encircled with Vesseloops.  I then dissected out the basilic vein, protecting the nerve.  There were multiple branches that were ligated between silk ties.  The vein measured approximately 5 mm.  The PA helped with exposure by providing retraction and suction.  Once an adequate length on the vein had been exposed, it was marked for orientation and then divided distally.  It distended nicely with heparin saline.  I then occluded the brachial artery.  It was opened with a #11 blade and extended longitudinally with Potts scissors.  The vein was cut the appropriate length and spatulated to fit the size of the arteriotomy.  A running anastomosis was then created with 6-0 Prolene.  The PA  provided assistance by following the suture.  Prior to completion, the appropriate flushing maneuvers were performed and the anastomosis was completed.  I inspected the course of the vein to make sure there were no kinks.  There was an excellent thrill within the fistula and a brisk radial artery Doppler signal.  Next hemostasis was achieved.  The wound was then irrigated and closed with 2 layers of Vicryl followed by Dermabond.  There were no complications.   Disposition: To PACU stable.   Theotis Burrow, M.D., [facility] [place] Office: [phone] Pager:  [phone]

## 2021-10-30 NOTE — Transfer of Care (Signed)
Immediate Anesthesia Transfer of Care Note  Patient: Antonio Keller  Procedure(s) Performed: LEFT ARM FIRST STAGE BASILIC VEIN FISTULA CREATION (Left: Arm Upper)  Patient Location: PACU  Anesthesia Type:General  Level of Consciousness: awake, drowsy and patient cooperative  Airway & Oxygen Therapy: Patient Spontanous Breathing  Post-op Assessment: Report given to RN and Post -op Vital signs reviewed and stable  Post vital signs: Reviewed and stable  Last Vitals:  Vitals Value Taken Time  BP 137/79 10/30/21 0910  Temp 36.6 C 10/30/21 0910  Pulse 70 10/30/21 0910  Resp 13 10/30/21 0911  SpO2 100 % 10/30/21 0910  Vitals shown include unvalidated device data.  Last Pain:  Vitals:   10/30/21 0614  TempSrc: Oral  PainSc: 0-No pain         Complications: No notable events documented.

## 2021-10-30 NOTE — Discharge Instructions (Signed)
[facility]  Discharge Instructions  AV Fistula or Graft Surgery for Dialysis Access  Please refer to the following instructions for your post-procedure care. Your surgeon or physician assistant will discuss any changes with you.  Activity  You may drive the day following your surgery, if you are comfortable and no longer taking prescription pain medication. Resume full activity as the soreness in your incision resolves.  Bathing/Showering  You may shower after you go home. Keep your incision dry for 48 hours. Do not soak in a bathtub, hot tub, or swim until the incision heals completely. You may not shower if you have a hemodialysis catheter.  Incision Care  Clean your incision with mild soap and water after 48 hours. Pat the area dry with a clean towel. You do not need a bandage unless otherwise instructed. Do not apply any ointments or creams to your incision. You may have skin glue on your incision. Do not peel it off. It will come off on its own in about one week. Your arm may swell a bit after surgery. To reduce swelling use pillows to elevate your arm so it is above your heart. Your doctor will tell you if you need to lightly wrap your arm with an ACE bandage.  Diet  Resume your normal diet. There are not special food restrictions following this procedure. In order to heal from your surgery, it is CRITICAL to get adequate nutrition. Your body requires vitamins, minerals, and protein. Vegetables are the best source of vitamins and minerals. Vegetables also provide the perfect balance of protein. Processed food has little nutritional value, so try to avoid this.  Medications  Resume taking all of your medications. If your incision is causing pain, you may take over-the counter pain relievers such as acetaminophen (Tylenol). If you were prescribed a stronger pain medication, please be aware these medications can cause nausea and constipation. Prevent  nausea by taking the medication with a snack or meal. Avoid constipation by drinking plenty of fluids and eating foods with high amount of fiber, such as fruits, vegetables, and grains.  Do not take Tylenol if you are taking prescription pain medications.  Follow up Your surgeon may want to see you in the office following your access surgery. If so, this will be arranged at the time of your surgery.  Please call us immediately for any of the following conditions:  Increased pain, redness, drainage (pus) from your incision site Fever of 101 degrees or higher Severe or worsening pain at your incision site Hand pain or numbness.  Reduce your risk of vascular disease:  Stop smoking. If you would like help, call [facility] at 1-800-QUIT-NOW (947) 114-4577) or [place] at [place] your cholesterol Maintain a desired weight Control your diabetes Keep your blood pressure down  Dialysis  It will take several weeks to several months for your new dialysis access to be ready for use. Your surgeon will determine when it is okay to use it. Your nephrologist will continue to direct your dialysis. You can continue to use your Permcath until your new access is ready for use.   10/30/2021 Antonio Keller [id number] 1960/07/07  Surgeon(s): Serafina Mitchell, MD  Procedure(s): LEFT ARM FIRST STAGE BASILIC VEIN FISTULA CREATION   May stick graft immediately   May stick graft on designated area only:   x Do not stick fistula for 12 weeks    If you have any questions, please call the office at  [phone].  

## 2021-10-30 NOTE — Anesthesia Procedure Notes (Signed)
Procedure Name: LMA Insertion Date/Time: 10/30/2021 7:45 AM Performed by: Dorthea Cove, CRNA Pre-anesthesia Checklist: Patient identified, Emergency Drugs available, Suction available and Patient being monitored Patient Re-evaluated:Patient Re-evaluated prior to induction Oxygen Delivery Method: Circle System Utilized Preoxygenation: Pre-oxygenation with 100% oxygen Induction Type: IV induction Ventilation: Mask ventilation without difficulty LMA: LMA inserted LMA Size: 4.0 Number of attempts: 1 Airway Equipment and Method: Bite block Placement Confirmation: positive ETCO2 Tube secured with: Tape Dental Injury: Teeth and Oropharynx as per pre-operative assessment

## 2021-10-30 NOTE — Anesthesia Postprocedure Evaluation (Signed)
Anesthesia Post Note  Patient: Antonio Keller  Procedure(s) Performed: LEFT ARM FIRST STAGE BASILIC VEIN FISTULA CREATION (Left: Arm Upper)     Patient location during evaluation: PACU Anesthesia Type: General Level of consciousness: sedated and patient cooperative Pain management: pain level controlled Vital Signs Assessment: post-procedure vital signs reviewed and stable Respiratory status: spontaneous breathing Cardiovascular status: stable Anesthetic complications: no   No notable events documented.  Last Vitals:  Vitals:   10/30/21 0925 10/30/21 0940  BP: 125/75 116/68  Pulse: 64 62  Resp: 11 17  Temp:  36.6 C  SpO2: 98% 97%    Last Pain:  Vitals:   10/30/21 0940  TempSrc:   PainSc: 0-No pain                 Nolon Nations

## 2021-10-31 ENCOUNTER — Encounter (HOSPITAL_COMMUNITY): Payer: Self-pay | Admitting: Surgery

## 2021-11-18 ENCOUNTER — Inpatient Hospital Stay: Payer: BC Managed Care – PPO | Admitting: Internal Medicine

## 2021-11-18 ENCOUNTER — Inpatient Hospital Stay: Payer: BC Managed Care – PPO | Attending: Family Medicine

## 2021-11-19 ENCOUNTER — Other Ambulatory Visit: Payer: Self-pay | Admitting: *Deleted

## 2021-11-19 DIAGNOSIS — N186 End stage renal disease: Secondary | ICD-10-CM

## 2021-12-01 ENCOUNTER — Encounter: Payer: BC Managed Care – PPO | Admitting: Surgery

## 2021-12-01 ENCOUNTER — Encounter (HOSPITAL_COMMUNITY): Payer: BC Managed Care – PPO

## 2021-12-08 ENCOUNTER — Encounter: Payer: Self-pay | Admitting: Surgery

## 2021-12-08 ENCOUNTER — Ambulatory Visit (HOSPITAL_COMMUNITY)
Admission: RE | Admit: 2021-12-08 | Discharge: 2021-12-08 | Disposition: A | Discharge status: Home / Self Care | Payer: BC Managed Care – PPO | Source: Ambulatory Visit | Attending: Surgery | Admitting: Surgery

## 2021-12-08 ENCOUNTER — Ambulatory Visit (INDEPENDENT_AMBULATORY_CARE_PROVIDER_SITE_OTHER): Payer: BC Managed Care – PPO | Admitting: Surgery

## 2021-12-08 VITALS — BP 141/88 | HR 75 | Temp 97.9°F | Resp 20 | Ht 72.0 in | Wt 179.0 lb

## 2021-12-08 DIAGNOSIS — N186 End stage renal disease: Secondary | ICD-10-CM

## 2021-12-08 DIAGNOSIS — Z992 Dependence on renal dialysis: Secondary | ICD-10-CM

## 2021-12-08 NOTE — Progress Notes (Signed)
Patient name: Antonio Keller MRN: [id number] DOB: 1960-06-20 Sex: male  REASON FOR VISIT:    Postop  HISTORY OF PRESENT ILLNESS:   Antonio Keller is a 61 y.o. male who is status post for stage left basilic vein fistula creation on 10/30/2021.  He has a history of a failed left brachiocephalic fistula.  CURRENT MEDICATIONS:    Current Outpatient Medications  Medication Sig Dispense Refill   allopurinol (ZYLOPRIM) 100 MG tablet Take 0.5 tablets (50 mg total) by mouth daily. 30 tablet 1   amiodarone (PACERONE) 200 MG tablet Take 1 tablet (200 mg total) by mouth daily. 90 tablet 3   atorvastatin (LIPITOR) 40 MG tablet Take 1 tablet (40 mg total) by mouth daily. 90 tablet 1   AURYXIA 1 GM 210 MG(Fe) tablet Take 420 mg by mouth daily.     calcitRIOL (ROCALTROL) 0.25 MCG capsule Take 1 capsule (0.25 mcg total) by mouth daily. 90 capsule 1   colchicine 0.6 MG tablet Take 1 tablet (0.6 mg total) by mouth daily. 30 tablet 1   ferrous sulfate 325 (65 FE) MG tablet Take 1 tablet (325 mg total) by mouth daily. 90 tablet 3   metoprolol succinate (TOPROL XL) 25 MG 24 hr tablet Take 1 tablet (25 mg total) by mouth daily. 90 tablet 3   Vitamin D3 (VITAMIN D) 25 MCG tablet Take 1 tablet (1,000 Units total) by mouth daily. 30 tablet 0   No current facility-administered medications for this visit.    REVIEW OF SYSTEMS:   [X]  denotes positive finding, [ ]  denotes negative finding Cardiac  Comments:  Chest pain or chest pressure:    Shortness of breath upon exertion:    Short of breath when lying flat:    Irregular heart rhythm:    Constitutional    Fever or chills:      PHYSICAL EXAM:   Vitals:   12/08/21 0851  BP: (!) 141/88  Pulse: 75  Resp: 20  Temp: 97.9 F (36.6 C)  SpO2: 98%  Weight: 179 lb (81.2 kg)  Height: 6' (1.829 m)    GENERAL: The patient is a well-nourished male, in no acute distress. The vital signs are documented above. CARDIOVASCULAR:  There is a regular rate and rhythm. PULMONARY: Non-labored respirations   STUDIES:   OUTFLOW VEINPSV (cm/s)Diameter (cm)Depth (cm)           Describe              +------------+----------+-------------+-----------+------------------------  ----+  Mid UA          89        0.74        0.67      Distal to mid up  joins,                                                   branches 0.16 cm, &  0.24 cm   +------------+----------+-------------+-----------+------------------------  ----+  Dist UA     248 / 608  0.64 / 0.35 0.69 / 1.24change in Diameter 1.3  cm in  length              +------------+----------+-------------+-----------+------------------------  ----+          Summary:  Patent arteriovenous fistula with increased velocity at area of  narrowing.Marland Kitchen   MEDICAL ISSUES:   End-stage renal disease: The patient is on dialysis Monday Wednesday Friday.  He is got undergone a for stage left basilic vein fistula.  Ultrasound shows that the vein is widely patent however there is an area of increased velocity at his stenosis somewhere within the vein.  I will plan on a second stage left basilic vein fistula creation and then shooting a on table venogram to identify the stenosis and either excise this portion of the vein or treated with venoplasty.  This procedure will be scheduled on a nondialysis day in the near future.  Leia Alf, MD, FACS [facility] [phone] Pager [phone]

## 2021-12-16 ENCOUNTER — Other Ambulatory Visit: Payer: Self-pay

## 2021-12-16 DIAGNOSIS — N186 End stage renal disease: Secondary | ICD-10-CM

## 2022-01-07 ENCOUNTER — Encounter (HOSPITAL_COMMUNITY): Payer: Self-pay | Admitting: Surgery

## 2022-01-07 ENCOUNTER — Other Ambulatory Visit: Payer: Self-pay

## 2022-01-07 NOTE — Progress Notes (Signed)
Interview done with the wife Antonio Keller, due to the pt being at a dialysis session.  PCP - Dr. Sinda Du  Cardiologist - Denies  EP-  Denies  Endocrine-  Denies  Pulm-  Denies  Chest x-ray - 02/14/21 (E)  EKG - 09/23/21 (E)  Stress Test - 03/27/21 (E)  ECHO - 10/02/21 (E)  Cardiac Cath -  Denies  AICD-na PM-na LOOP-na  Nerve Stimulator-  Denies  Dialysis- M-W-F  Sleep Study -  Denies CPAP -  Denies  LABS- 01/08/22: I-Stat 8  ASA-  Denies  ERAS- No  HA1C-  Denies  Anesthesia- No  Antonio Keller denies the pt having chest pain, sob, or fever during the pre-op phone call. All instructions explained to Antonio Keller, with a verbal understanding of the material including: as of today, stop taking all Aspirin (unless instructed by your doctor) and Other Aspirin containing products, Vitamins, Fish oils, and Herbal medications. Also stop all NSAIDS i.e. Advil, Ibuprofen, Motrin, Aleve, Anaprox, Naproxen, BC, Goody Powders, and all Supplements.  Pt also instructed to wear a mask and social distance if he goes out. The opportunity to ask questions was provided.

## 2022-01-08 ENCOUNTER — Other Ambulatory Visit: Payer: Self-pay

## 2022-01-08 ENCOUNTER — Ambulatory Visit (HOSPITAL_COMMUNITY): Payer: BC Managed Care – PPO | Admitting: Certified Registered Nurse Anesthetist

## 2022-01-08 ENCOUNTER — Ambulatory Visit (HOSPITAL_COMMUNITY): Payer: BC Managed Care – PPO

## 2022-01-08 ENCOUNTER — Ambulatory Visit (HOSPITAL_COMMUNITY)
Admission: RE | Admit: 2022-01-08 | Discharge: 2022-01-08 | Disposition: A | Discharge status: Home / Self Care | Payer: BC Managed Care – PPO | Attending: Surgery | Admitting: Surgery

## 2022-01-08 ENCOUNTER — Encounter (HOSPITAL_COMMUNITY)
Admission: RE | Disposition: A | Discharge status: Home / Self Care | Payer: Self-pay | Source: Home / Self Care | Attending: Surgery

## 2022-01-08 ENCOUNTER — Encounter (HOSPITAL_COMMUNITY): Payer: Self-pay | Admitting: Surgery

## 2022-01-08 DIAGNOSIS — N186 End stage renal disease: Secondary | ICD-10-CM | POA: Diagnosis not present

## 2022-01-08 DIAGNOSIS — I509 Heart failure, unspecified: Secondary | ICD-10-CM | POA: Insufficient documentation

## 2022-01-08 DIAGNOSIS — N185 Chronic kidney disease, stage 5: Secondary | ICD-10-CM

## 2022-01-08 DIAGNOSIS — Z992 Dependence on renal dialysis: Secondary | ICD-10-CM | POA: Diagnosis not present

## 2022-01-08 DIAGNOSIS — D631 Anemia in chronic kidney disease: Secondary | ICD-10-CM | POA: Diagnosis not present

## 2022-01-08 DIAGNOSIS — Z79899 Other long term (current) drug therapy: Secondary | ICD-10-CM | POA: Diagnosis not present

## 2022-01-08 DIAGNOSIS — I132 Hypertensive heart and chronic kidney disease with heart failure and with stage 5 chronic kidney disease, or end stage renal disease: Secondary | ICD-10-CM | POA: Diagnosis not present

## 2022-01-08 HISTORY — PX: BASCILIC VEIN TRANSPOSITION: SHX5742

## 2022-01-08 LAB — POCT I-STAT, CHEM 8
BUN: 11 mg/dL (ref 6–20)
Calcium, Ion: 1.14 mmol/L — ABNORMAL LOW (ref 1.15–1.40)
Chloride: 93 mmol/L — ABNORMAL LOW (ref 98–111)
Creatinine, Ser: 9.6 mg/dL — ABNORMAL HIGH (ref 0.61–1.24)
Glucose, Bld: 84 mg/dL (ref 70–99)
HCT: 38 % — ABNORMAL LOW (ref 39.0–52.0)
Hemoglobin: 12.9 g/dL — ABNORMAL LOW (ref 13.0–17.0)
Potassium: 4.7 mmol/L (ref 3.5–5.1)
Sodium: 135 mmol/L (ref 135–145)
TCO2: 31 mmol/L (ref 22–32)

## 2022-01-08 SURGERY — TRANSPOSITION, VEIN, BASILIC
Anesthesia: General | Site: Arm Upper | Laterality: Left

## 2022-01-08 MED ORDER — OXYCODONE-ACETAMINOPHEN 5-325 MG PO TABS: 1.0000 | ORAL_TABLET | ORAL | 0 refills | Status: AC | PRN

## 2022-01-08 MED ORDER — CEFAZOLIN SODIUM-DEXTROSE 2-4 GM/100ML-% IV SOLN
2.0000 g | INTRAVENOUS | Status: AC
  Administered 2022-01-08: 2 g via INTRAVENOUS
  Filled 2022-01-08: qty 100

## 2022-01-08 MED ORDER — BUPIVACAINE HCL (PF) 0.5 % IJ SOLN
INTRAMUSCULAR | Status: AC
  Filled 2022-01-08: qty 30

## 2022-01-08 MED ORDER — BUPIVACAINE LIPOSOME 1.3 % IJ SUSP
INTRAMUSCULAR | Status: AC
  Filled 2022-01-08: qty 20

## 2022-01-08 MED ORDER — DEXAMETHASONE SODIUM PHOSPHATE 10 MG/ML IJ SOLN
INTRAMUSCULAR | Status: DC | PRN
  Administered 2022-01-08: 10 mg via INTRAVENOUS

## 2022-01-08 MED ORDER — SODIUM CHLORIDE 0.9 % IV SOLN: INTRAVENOUS | Status: DC

## 2022-01-08 MED ORDER — ONDANSETRON HCL 4 MG/2ML IJ SOLN
INTRAMUSCULAR | Status: DC | PRN
  Administered 2022-01-08: 4 mg via INTRAVENOUS

## 2022-01-08 MED ORDER — PHENYLEPHRINE 80 MCG/ML (10ML) SYRINGE FOR IV PUSH (FOR BLOOD PRESSURE SUPPORT)
PREFILLED_SYRINGE | INTRAVENOUS | Status: DC | PRN
  Administered 2022-01-08: 160 ug via INTRAVENOUS
  Administered 2022-01-08 (×4): 80 ug via INTRAVENOUS

## 2022-01-08 MED ORDER — LIDOCAINE 2% (20 MG/ML) 5 ML SYRINGE
INTRAMUSCULAR | Status: DC | PRN
  Administered 2022-01-08: 60 mg via INTRAVENOUS

## 2022-01-08 MED ORDER — CHLORHEXIDINE GLUCONATE 4 % EX LIQD: 60.0000 mL | Freq: Once | CUTANEOUS | Status: DC

## 2022-01-08 MED ORDER — MIDAZOLAM HCL 2 MG/2ML IJ SOLN
INTRAMUSCULAR | Status: AC
  Filled 2022-01-08: qty 2

## 2022-01-08 MED ORDER — FENTANYL CITRATE (PF) 100 MCG/2ML IJ SOLN: 25.0000 ug | INTRAMUSCULAR | Status: DC | PRN

## 2022-01-08 MED ORDER — FENTANYL CITRATE (PF) 100 MCG/2ML IJ SOLN
INTRAMUSCULAR | Status: DC | PRN
Start: 2022-01-08 — End: 2022-01-08
  Administered 2022-01-08: 50 ug via INTRAVENOUS
  Administered 2022-01-08: 25 ug via INTRAVENOUS

## 2022-01-08 MED ORDER — PROPOFOL 10 MG/ML IV BOLUS
INTRAVENOUS | Status: AC
  Filled 2022-01-08: qty 20

## 2022-01-08 MED ORDER — CHLORHEXIDINE GLUCONATE 0.12 % MT SOLN
15.0000 mL | Freq: Once | OROMUCOSAL | Status: AC
  Administered 2022-01-08: 15 mL via OROMUCOSAL
  Filled 2022-01-08: qty 15

## 2022-01-08 MED ORDER — HEPARIN 6000 UNIT IRRIGATION SOLUTION
Status: DC | PRN
  Administered 2022-01-08: 1

## 2022-01-08 MED ORDER — SODIUM CHLORIDE 0.9 % IV SOLN
INTRAVENOUS | Status: DC
Start: 2022-01-08 — End: 2022-01-08

## 2022-01-08 MED ORDER — HEMOSTATIC AGENTS (NO CHARGE) OPTIME
TOPICAL | Status: DC | PRN
  Administered 2022-01-08: 1 via TOPICAL

## 2022-01-08 MED ORDER — PROMETHAZINE HCL 25 MG/ML IJ SOLN: 6.2500 mg | INTRAMUSCULAR | Status: DC | PRN

## 2022-01-08 MED ORDER — LIDOCAINE-EPINEPHRINE (PF) 1 %-1:200000 IJ SOLN
INTRAMUSCULAR | Status: AC
  Filled 2022-01-08: qty 30

## 2022-01-08 MED ORDER — BUPIVACAINE LIPOSOME 1.3 % IJ SUSP
INTRAMUSCULAR | Status: DC | PRN
  Administered 2022-01-08: 75 mL

## 2022-01-08 MED ORDER — DEXAMETHASONE SODIUM PHOSPHATE 10 MG/ML IJ SOLN
INTRAMUSCULAR | Status: AC
  Filled 2022-01-08: qty 1

## 2022-01-08 MED ORDER — LIDOCAINE 2% (20 MG/ML) 5 ML SYRINGE
INTRAMUSCULAR | Status: AC
  Filled 2022-01-08: qty 5

## 2022-01-08 MED ORDER — ORAL CARE MOUTH RINSE: 15.0000 mL | Freq: Once | OROMUCOSAL | Status: AC

## 2022-01-08 MED ORDER — PROPOFOL 10 MG/ML IV BOLUS
INTRAVENOUS | Status: DC | PRN
  Administered 2022-01-08: 150 mg via INTRAVENOUS

## 2022-01-08 MED ORDER — PHENYLEPHRINE HCL-NACL 20-0.9 MG/250ML-% IV SOLN
INTRAVENOUS | Status: DC | PRN
  Administered 2022-01-08: 40 ug/min via INTRAVENOUS

## 2022-01-08 MED ORDER — 0.9 % SODIUM CHLORIDE (POUR BTL) OPTIME
TOPICAL | Status: DC | PRN
  Administered 2022-01-08: 1000 mL

## 2022-01-08 MED ORDER — ONDANSETRON HCL 4 MG/2ML IJ SOLN
INTRAMUSCULAR | Status: AC
  Filled 2022-01-08: qty 2

## 2022-01-08 MED ORDER — ACETAMINOPHEN 500 MG PO TABS
1000.0000 mg | ORAL_TABLET | Freq: Once | ORAL | Status: AC
  Administered 2022-01-08: 1000 mg via ORAL
  Filled 2022-01-08: qty 2

## 2022-01-08 MED ORDER — HEPARIN 6000 UNIT IRRIGATION SOLUTION
Status: AC
  Filled 2022-01-08: qty 500

## 2022-01-08 MED ORDER — BUPIVACAINE HCL 0.5 % IJ SOLN
INTRAMUSCULAR | Status: DC | PRN
  Administered 2022-01-08: 75 mL

## 2022-01-08 MED ORDER — FENTANYL CITRATE (PF) 250 MCG/5ML IJ SOLN
INTRAMUSCULAR | Status: AC
  Filled 2022-01-08: qty 5

## 2022-01-08 MED ORDER — MIDAZOLAM HCL 5 MG/5ML IJ SOLN
INTRAMUSCULAR | Status: DC | PRN
  Administered 2022-01-08: 2 mg via INTRAVENOUS

## 2022-01-08 SURGICAL SUPPLY — 73 items
ARMBAND PINK RESTRICT EXTREMIT (MISCELLANEOUS) ×3 IMPLANT
BAG COUNTER SPONGE SURGICOUNT (BAG) ×3 IMPLANT
BAG SNAP BAND KOVER 36X36 (MISCELLANEOUS) ×3 IMPLANT
BANDAGE ACE 4 STERILE (GAUZE/BANDAGES/DRESSINGS) ×2 IMPLANT
BANDAGE ACE 4X5 VEL STRL LF (GAUZE/BANDAGES/DRESSINGS) ×2 IMPLANT
BLADE SURG 11 STRL SS (BLADE) ×3 IMPLANT
BNDG GAUZE DERMACEA FLUFF (GAUZE/BANDAGES/DRESSINGS) ×1
BNDG GAUZE DERMACEA FLUFF 4 (GAUZE/BANDAGES/DRESSINGS) ×1 IMPLANT
CANISTER SUCT 3000ML PPV (MISCELLANEOUS) ×3 IMPLANT
CATH OMNI FLUSH 5F 65CM (CATHETERS) ×3 IMPLANT
CHLORAPREP W/TINT 26 (MISCELLANEOUS) ×3 IMPLANT
CLIP VESOCCLUDE MED 24/CT (CLIP) IMPLANT
CLIP VESOCCLUDE MED 6/CT (CLIP) IMPLANT
CLIP VESOCCLUDE SM WIDE 24/CT (CLIP) IMPLANT
CLIP VESOCCLUDE SM WIDE 6/CT (CLIP) IMPLANT
COVER BACK TABLE 60X90IN (DRAPES) ×3 IMPLANT
COVER DOME SNAP 22 D (MISCELLANEOUS) ×3 IMPLANT
COVER PROBE W GEL 5X96 (DRAPES) ×3 IMPLANT
COVER SURGICAL LIGHT HANDLE (MISCELLANEOUS) ×3 IMPLANT
DERMABOND ×2 IMPLANT
DERMABOND ADVANCED (GAUZE/BANDAGES/DRESSINGS) ×1
DERMABOND ADVANCED .7 DNX12 (GAUZE/BANDAGES/DRESSINGS) ×2 IMPLANT
DRAPE FEMORAL ANGIO 80X135IN (DRAPES) ×3 IMPLANT
DRSG KUZMA FLUFF (GAUZE/BANDAGES/DRESSINGS) ×2 IMPLANT
ELECT REM PT RETURN 9FT ADLT (ELECTROSURGICAL) ×3
ELECTRODE REM PT RTRN 9FT ADLT (ELECTROSURGICAL) ×2 IMPLANT
GLOVE ECLIPSE 7.0 STRL STRAW (GLOVE) ×2 IMPLANT
GLOVE INDICATOR 7.0 STRL GRN (GLOVE) ×2 IMPLANT
GLOVE INDICATOR 7.5 STRL GRN (GLOVE) ×4 IMPLANT
GLOVE SURG SS PI 6.5 STRL IVOR (GLOVE) ×2 IMPLANT
GLOVE SURG SS PI 7.5 STRL IVOR (GLOVE) ×9 IMPLANT
GOWN STRL REUS W/ TWL LRG LVL3 (GOWN DISPOSABLE) ×5 IMPLANT
GOWN STRL REUS W/ TWL XL LVL3 (GOWN DISPOSABLE) ×2 IMPLANT
GOWN STRL REUS W/TWL LRG LVL3 (GOWN DISPOSABLE) ×3
GOWN STRL REUS W/TWL XL LVL3 (GOWN DISPOSABLE) ×1
HEMOSTAT SNOW SURGICEL 2X4 (HEMOSTASIS) IMPLANT
KIT BASIN OR (CUSTOM PROCEDURE TRAY) ×3 IMPLANT
KIT TURNOVER KIT B (KITS) ×3 IMPLANT
NDL 18GX1X1/2 (RX/OR ONLY) (NEEDLE) ×1 IMPLANT
NDL PERC 18GX7CM (NEEDLE) ×1 IMPLANT
NEEDLE 18GX1X1/2 (RX/OR ONLY) (NEEDLE) ×3 IMPLANT
NEEDLE PERC 18GX7CM (NEEDLE) ×3 IMPLANT
NS IRRIG 1000ML POUR BTL (IV SOLUTION) ×3 IMPLANT
PACK CV ACCESS (CUSTOM PROCEDURE TRAY) ×3 IMPLANT
PAD ARMBOARD 7.5X6 YLW CONV (MISCELLANEOUS) ×6 IMPLANT
PROTECTION STATION PRESSURIZED (MISCELLANEOUS) ×3
SET MICROPUNCTURE 5F STIFF (MISCELLANEOUS) ×3 IMPLANT
SHEATH PINNACLE 5F 10CM (SHEATH) ×3 IMPLANT
SLING ARM FOAM STRAP LRG (SOFTGOODS) IMPLANT
SLING ARM FOAM STRAP MED (SOFTGOODS) IMPLANT
SPONGE T-LAP 18X18 ~~LOC~~+RFID (SPONGE) ×2 IMPLANT
STATION PROTECTION PRESSURIZED (MISCELLANEOUS) ×2 IMPLANT
STOPCOCK MORSE 400PSI 3WAY (MISCELLANEOUS) ×3 IMPLANT
SURGIFLO W/THROMBIN 8M KIT (HEMOSTASIS) ×2 IMPLANT
SUT PROLENE 5 0 C 1 24 (SUTURE) ×2 IMPLANT
SUT PROLENE 6 0 CC (SUTURE) ×5 IMPLANT
SUT PROLENE 7 0 BV 1 (SUTURE) ×2 IMPLANT
SUT SILK 2 0 (SUTURE) ×1
SUT SILK 2 0 SH (SUTURE) IMPLANT
SUT SILK 2-0 18XBRD TIE 12 (SUTURE) ×1 IMPLANT
SUT VIC AB 3-0 SH 27 (SUTURE) ×2
SUT VIC AB 3-0 SH 27X BRD (SUTURE) ×3 IMPLANT
SUT VICRYL 4-0 PS2 18IN ABS (SUTURE) ×3 IMPLANT
SYR 10ML LL (SYRINGE) ×12 IMPLANT
SYR 20ML LL LF (SYRINGE) ×6 IMPLANT
SYR 30ML LL (SYRINGE) ×3 IMPLANT
SYR MEDRAD MARK V 150ML (SYRINGE) ×3 IMPLANT
TOWEL GREEN STERILE (TOWEL DISPOSABLE) ×3 IMPLANT
TRAY FOLEY MTR SLVR 16FR STAT (SET/KITS/TRAYS/PACK) IMPLANT
TUBING CIL FLEX 10 FLL-RA (TUBING) ×3 IMPLANT
UNDERPAD 30X36 HEAVY ABSORB (UNDERPADS AND DIAPERS) ×3 IMPLANT
WATER STERILE IRR 1000ML POUR (IV SOLUTION) ×3 IMPLANT
WIRE BENTSON .035X145CM (WIRE) ×3 IMPLANT

## 2022-01-08 NOTE — Anesthesia Preprocedure Evaluation (Signed)
Anesthesia Evaluation  Patient identified by MRN, date of birth, ID band Patient awake    Reviewed: Allergy & Precautions, NPO status , Patient's Chart, lab work & pertinent test results  Airway Mallampati: II  TM Distance: >3 FB Neck ROM: Full    Dental no notable dental hx.    Pulmonary neg pulmonary ROS,    Pulmonary exam normal breath sounds clear to auscultation       Cardiovascular hypertension, Pt. on medications and Pt. on home beta blockers +CHF  Normal cardiovascular exam Rhythm:Regular Rate:Normal  Echo 09/2021 1. Left ventricular ejection fraction, by estimation, is 60 to 65%. The left ventricle has normal function. The left ventricle has no regional wall motion abnormalities. There is mild concentric left ventricular hypertrophy. Left ventricular diastolic parameters are consistent with Grade I diastolic dysfunction (impaired relaxation).  2. Right ventricular systolic function is normal. The right ventricular size is normal. Tricuspid regurgitation signal is inadequate for assessing PA pressure.  3. The mitral valve is normal in structure. Trivial mitral valve regurgitation. No evidence of mitral stenosis.  4. The aortic valve is tricuspid. Aortic valve regurgitation is trivial. Aortic valve sclerosis is present, with no evidence of aortic valve stenosis.  5. Aortic dilatation noted. There is mild dilatation of the aortic root, measuring 40 mm.  6. The inferior vena cava is normal in size with greater than 50% respiratory variability, suggesting right atrial pressure of 3 mmHg.    Neuro/Psych negative neurological ROS     GI/Hepatic negative GI ROS, (+) Hepatitis -  Endo/Other  negative endocrine ROS  Renal/GU Renal disease     Musculoskeletal negative musculoskeletal ROS (+)   Abdominal   Peds  Hematology  (+) Blood dyscrasia, anemia ,   Anesthesia Other Findings   Reproductive/Obstetrics                              Anesthesia Physical  Anesthesia Plan  ASA: 3  Anesthesia Plan: General   Post-op Pain Management: Tylenol PO (pre-op)*   Induction: Intravenous  PONV Risk Score and Plan: 1 and Ondansetron, Dexamethasone and Treatment may vary due to age or medical condition  Airway Management Planned: LMA  Additional Equipment:   Intra-op Plan:   Post-operative Plan: Extubation in OR  Informed Consent: I have reviewed the patients History and Physical, chart, labs and discussed the procedure including the risks, benefits and alternatives for the proposed anesthesia with the patient or authorized representative who has indicated his/her understanding and acceptance.     Dental advisory given  Plan Discussed with: CRNA  Anesthesia Plan Comments: (Pt requests GA  PAT note by Karoline Caldwell, PA-C:  Follows with cardiology for history of accessible atrial fibrillation (off Eliquis due to 2 episodes of hematochezia in the setting of thrombocytopenia),chronic combined heart failure, HTN.Most recent echo 10/02/21 showed LVEF normalized to 60-65%, mild concentric LVH, grade 1 diastolic dysfunction, normal RV function, mild aortic root dilatation measuring 40 mm.Lexiscan Myoview on 03/27/2021 showed normal perfusion, EF 46%.  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 This has been improving. Last platelets up to 120k and Hgb stable at 10.4 on 09/23/21.  ESRD on HD Monday Wednesday Friday currently dialyzing through right IJ tunnel catheter. He previously had a left brachiocephalic AV fistula placed by Dr. Virl Cagey last year thathas had multiple attempts at endovascular and open salvage that is now thrombosed.  Patient will need day  of surgery labs and evaluation.  EKG 09/23/21: Sinus rhythm. Rate 75. Prolonged PR interval. LVH.  Nuclear stress test 03/27/21: . Findings are consistent with no  ischemia. The study is intermediate risk. . No ST deviation was noted. . LV perfusion is normal. There is no evidence of ischemia. There is no evidence of infarction. . Left ventricular function is abnormal. Nuclear stress EF: 46 %. The left ventricular ejection fraction is mildly decreased (45-54%). End diastolic cavity size is severely enlarged. End systolic cavity size is severely enlarged. . Prior study not available for comparison.  Echo 10/02/21 1. Left ventricular ejection fraction, by estimation, is 60 to 65%. The  left ventricle has normal function. The left ventricle has no regional  wall motion abnormalities. There is mild concentric left ventricular  hypertrophy. Left ventricular diastolic  parameters are consistent with Grade I diastolic dysfunction (impaired  relaxation).  2. Right ventricular systolic function is normal. The right ventricular  size is normal. Tricuspid regurgitation signal is inadequate for assessing  PA pressure.  3. The mitral valve is normal in structure. Trivial mitral valve  regurgitation. No evidence of mitral stenosis.  4. The aortic valve is tricuspid. Aortic valve regurgitation is trivial.  Aortic valve sclerosis is present, with no evidence of aortic valve  stenosis.  5. Aortic dilatation noted. There is mild dilatation of the aortic root,  measuring 40 mm.  6. The inferior vena cava is normal in size with greater than 50%  respiratory variability, suggesting right atrial pressure of 3 mmHg. )        Anesthesia Quick Evaluation

## 2022-01-08 NOTE — H&P (Signed)
Patient name: Antonio Keller          MRN: [id number]        DOB: [birth year]          Sex: male   REASON FOR VISIT:      Postop   HISTORY OF PRESENT ILLNESS:    Antonio Keller is a 61 y.o. male who is status post for stage left basilic vein fistula creation on 10/30/2021.  He has a history of a failed left brachiocephalic fistula.   CURRENT MEDICATIONS:            Current Outpatient Medications  Medication Sig Dispense Refill   allopurinol (ZYLOPRIM) 100 MG tablet Take 0.5 tablets (50 mg total) by mouth daily. 30 tablet 1   amiodarone (PACERONE) 200 MG tablet Take 1 tablet (200 mg total) by mouth daily. 90 tablet 3   atorvastatin (LIPITOR) 40 MG tablet Take 1 tablet (40 mg total) by mouth daily. 90 tablet 1   AURYXIA 1 GM 210 MG(Fe) tablet Take 420 mg by mouth daily.       calcitRIOL (ROCALTROL) 0.25 MCG capsule Take 1 capsule (0.25 mcg total) by mouth daily. 90 capsule 1   colchicine 0.6 MG tablet Take 1 tablet (0.6 mg total) by mouth daily. 30 tablet 1   ferrous sulfate 325 (65 FE) MG tablet Take 1 tablet (325 mg total) by mouth daily. 90 tablet 3   metoprolol succinate (TOPROL XL) 25 MG 24 hr tablet Take 1 tablet (25 mg total) by mouth daily. 90 tablet 3   Vitamin D3 (VITAMIN D) 25 MCG tablet Take 1 tablet (1,000 Units total) by mouth daily. 30 tablet 0    No current facility-administered medications for this visit.      REVIEW OF SYSTEMS:    [X]  denotes positive finding, [ ]  denotes negative finding Cardiac   Comments:  Chest pain or chest pressure:      Shortness of breath upon exertion:      Short of breath when lying flat:      Irregular heart rhythm:      Constitutional      Fever or chills:          PHYSICAL EXAM:       Vitals:    12/08/21 0851  BP: (!) 141/88  Pulse: 75  Resp: 20  Temp: 97.9 F (36.6 C)  SpO2: 98%  Weight: 179 lb (81.2 kg)  Height: 6' (1.829 m)      GENERAL: The patient is a well-nourished male, in no acute  distress. The vital signs are documented above. CARDIOVASCULAR: There is a regular rate and rhythm. PULMONARY: Non-labored respirations     STUDIES:    OUTFLOW VEINPSV (cm/s)Diameter (cm)Depth (cm)           Describe              +------------+----------+-------------+-----------+------------------------  ----+  Mid UA          89        0.74        0.67      Distal to mid up  joins,                                                   branches 0.16 cm, &  0.24 cm   +------------+----------+-------------+-----------+------------------------  ----+  Dist UA     248 / 608  0.64 / 0.35 0.69 / 1.24change in Diameter 1.3  cm in                                                           length              +------------+----------+-------------+-----------+------------------------  ----+          Summary:  Patent arteriovenous fistula with increased velocity at area of  narrowing.Marland Kitchen     MEDICAL ISSUES:    End-stage renal disease: The patient is on dialysis Monday Wednesday Friday.  He is got undergone a for stage left basilic vein fistula.  Ultrasound shows that the vein is widely patent however there is an area of increased velocity at his stenosis somewhere within the vein.  I will plan on a second stage left basilic vein fistula creation and then shooting a on table venogram to identify the stenosis and either excise this portion of the vein or treated with venoplasty.  This procedure will be scheduled on a nondialysis day in the near future.   Leia Alf, MD, FACS [facility] [phone] Pager [phone]

## 2022-01-08 NOTE — Anesthesia Procedure Notes (Signed)
Procedure Name: LMA Insertion Date/Time: 01/08/2022 7:38 AM  Performed by: Genelle Bal, CRNAPre-anesthesia Checklist: Patient identified, Emergency Drugs available, Suction available and Patient being monitored Patient Re-evaluated:Patient Re-evaluated prior to induction Oxygen Delivery Method: Circle system utilized Preoxygenation: Pre-oxygenation with 100% oxygen Induction Type: IV induction Ventilation: Mask ventilation without difficulty LMA: LMA inserted LMA Size: 4.0 Number of attempts: 1 Airway Equipment and Method: Bite block Placement Confirmation: positive ETCO2 Tube secured with: Tape Dental Injury: Teeth and Oropharynx as per pre-operative assessment

## 2022-01-08 NOTE — Discharge Instructions (Addendum)
[facility]  Discharge Instructions  AV Fistula or Graft Surgery for Dialysis Access  Please refer to the following instructions for your post-procedure care. Your surgeon or physician assistant will discuss any changes with you.  Activity  You may drive the day following your surgery, if you are comfortable and no longer taking prescription pain medication. Resume full activity as the soreness in your incision resolves.  Bathing/Showering  You may shower after you go home. Keep your incision dry for 48 hours. Do not soak in a bathtub, hot tub, or swim until the incision heals completely. You may not shower if you have a hemodialysis catheter.  Incision Care  Clean your incision with mild soap and water after 48 hours. Pat the area dry with a clean towel. You do not need a bandage unless otherwise instructed. Do not apply any ointments or creams to your incision. You may have skin glue on your incision. Do not peel it off. It will come off on its own in about one week. Your arm may swell a bit after surgery. To reduce swelling use pillows to elevate your arm so it is above your heart. Your doctor will tell you if you need to lightly wrap your arm with an ACE bandage.  Diet  Resume your normal diet. There are not special food restrictions following this procedure. In order to heal from your surgery, it is CRITICAL to get adequate nutrition. Your body requires vitamins, minerals, and protein. Vegetables are the best source of vitamins and minerals. Vegetables also provide the perfect balance of protein. Processed food has little nutritional value, so try to avoid this.  Medications  Resume taking all of your medications. If your incision is causing pain, you may take over-the counter pain relievers such as acetaminophen (Tylenol). If you were prescribed a stronger pain medication, please be aware these medications can cause nausea and constipation. Prevent  nausea by taking the medication with a snack or meal. Avoid constipation by drinking plenty of fluids and eating foods with high amount of fiber, such as fruits, vegetables, and grains.  Do not take Tylenol if you are taking prescription pain medications.  Follow up Your surgeon may want to see you in the office following your access surgery. If so, this will be arranged at the time of your surgery.  Please call us immediately for any of the following conditions:  Increased pain, redness, drainage (pus) from your incision site Fever of 101 degrees or higher Severe or worsening pain at your incision site Hand pain or numbness.  Reduce your risk of vascular disease:  Stop smoking. If you would like help, call [facility] at 1-800-QUIT-NOW 949-301-4714) or [name] at [place] your cholesterol Maintain a desired weight Control your diabetes Keep your blood pressure down  Dialysis  It will take several weeks to several months for your new dialysis access to be ready for use. Your surgeon will determine when it is okay to use it. Your nephrologist will continue to direct your dialysis. You can continue to use your Permcath until your new access is ready for use.   01/08/2022 Antonio Keller [id number] 12/15/1960  Surgeon(s): Antonio Mitchell, MD  Procedure(s): SECOND STAGE LEFT BASILIC VEIN FISTULA   May stick graft immediately   May stick graft on designated area only:   x Do not stick fistula for 4 weeks    If you have any questions, please call the office at [phone].

## 2022-01-08 NOTE — Anesthesia Postprocedure Evaluation (Signed)
Anesthesia Post Note  Patient: Ildefonso Mallari  Procedure(s) Performed: SECOND STAGE LEFT BASILIC VEIN FISTULA (Left: Arm Upper)     Patient location during evaluation: PACU Anesthesia Type: General Level of consciousness: awake and alert Pain management: pain level controlled Vital Signs Assessment: post-procedure vital signs reviewed and stable Respiratory status: spontaneous breathing, nonlabored ventilation, respiratory function stable and patient connected to nasal cannula oxygen Cardiovascular status: blood pressure returned to baseline and stable Postop Assessment: no apparent nausea or vomiting Anesthetic complications: no   No notable events documented.  Last Vitals:  Vitals:   01/08/22 1015 01/08/22 1030  BP: 109/81 101/72  Pulse: 72 70  Resp: (!) 9 10  Temp:  36.4 C  SpO2: 97% 98%    Last Pain:  Vitals:   01/08/22 1030  TempSrc:   PainSc: 0-No pain                 Tiajuana Amass

## 2022-01-08 NOTE — Transfer of Care (Signed)
Immediate Anesthesia Transfer of Care Note  Patient: Antonio Keller  Procedure(s) Performed: SECOND STAGE LEFT BASILIC VEIN FISTULA (Left: Arm Upper)  Patient Location: PACU  Anesthesia Type:General  Level of Consciousness: awake, alert  and oriented  Airway & Oxygen Therapy: Patient Spontanous Breathing and Patient connected to face mask oxygen  Post-op Assessment: Report given to RN and Post -op Vital signs reviewed and stable  Post vital signs: Reviewed and stable  Last Vitals:  Vitals Value Taken Time  BP 131/84 01/08/22 1000  Temp 36.1 C 01/08/22 1000  Pulse 74 01/08/22 1000  Resp 9 01/08/22 1000  SpO2 100 % 01/08/22 1000    Last Pain:  Vitals:   01/08/22 0628  TempSrc: Oral  PainSc:          Complications: No notable events documented.

## 2022-01-09 ENCOUNTER — Encounter (HOSPITAL_COMMUNITY): Payer: Self-pay | Admitting: Surgery

## 2022-01-09 NOTE — Op Note (Signed)
    Patient name: Antonio Keller MRN: [id number] DOB: May 12, 1961 Sex: male  01/08/2022 Pre-operative Diagnosis: ESRD Post-operative diagnosis:  Same Surgeon:  Annamarie Major Assistants:  Luisa Dago, PA Procedure:   Revision of left arm fistula (2nd stage transposition Anesthesia:  General Blood Loss:  minimal Specimens:  none  Findings:  excellent vein, > 54mm throughout  Indications: This is a 61 year old gentleman who comes in today for second stage basilic vein fistula.  Ultrasound suggested a stenosis within the vein, however this was not encountered during surgery and so no venogram was performed  Procedure:  The patient was identified in the holding area and taken to [place] [facility]  The patient was then placed supine on the table. general anesthesia was administered.  The patient was prepped and draped in the usual sterile fashion.  A time out was called and antibiotics were administered.  A PA was necessary to expedite the procedure and assist with technical details.  She provided suction and retraction, and also followed the suture for the anastomosis and help with closure.  Ultrasound used to evaluate basilic vein in the upper arm on the left.  This was widely patent and easily compressible.  2 longitudinal incisions were made in the upper arm.  Through these incisions, the basilic vein was circumferentially dissected free.  There were multiple side branches that were all ligated between silk ties.  Once the vein was mobilized from the axilla to the antecubital crease, it was marked for orientation.  The vein measured uniform diameter throughout about 8 mm.  The vein was then occluded proximally and distally with baby Gregory clamps and then transected just above the antecubital crease.  I then used Exparel to infiltrate the site of the tunnel as well as the incision sites.  A curved Gore tunneler was used to create a tunnel, and then a uterine dressing clamp was used to pull the vein  through the tunnel, making sure to maintain proper orientation.  A end to end anastomosis was then created near the antecubital crease with running 5-0 Prolene.  Prior to completion the appropriate flushing maneuvers were performed and the anastomosis was completed.  There was an excellent thrill within the fistula.  The patient had a palpable radial pulse.  The wounds were then irrigated.  The incisions were closed with a deep layer 3-0 Vicryl followed by subcuticular closure.  Dermabond was applied followed by Kerlix and Ace wrap.  There were no immediate complications   Disposition: To PACU stable.   Theotis Burrow, M.D., [facility] [place] Office: [phone] Pager:  [phone]

## 2022-02-06 ENCOUNTER — Other Ambulatory Visit: Payer: BC Managed Care – PPO

## 2022-02-09 ENCOUNTER — Ambulatory Visit (INDEPENDENT_AMBULATORY_CARE_PROVIDER_SITE_OTHER): Payer: BC Managed Care – PPO | Admitting: Physician Assistant

## 2022-02-09 VITALS — BP 135/85 | HR 68 | Temp 98.1°F | Resp 16 | Ht 72.0 in | Wt 173.0 lb

## 2022-02-09 DIAGNOSIS — N186 End stage renal disease: Secondary | ICD-10-CM

## 2022-02-09 NOTE — Progress Notes (Signed)
    Postoperative Access Visit   History of Present Illness   Antonio Keller is a 61 y.o. year old male who presents for postoperative follow-up for: left second stage basilic vein transposition (Date: 01/08/22).  The patient's wounds are  healed.  The patient denies steal symptoms.  The patient is able to complete their activities of daily living.  He is currently dialyzing via right IJ [facility] on a Monday Wednesday Friday schedule [facility].  Physical Examination   Vitals:   02/09/22 0830  BP: 135/85  Pulse: 68  Resp: 16  Temp: 98.1 F (36.7 C)  TempSrc: Temporal  SpO2: 100%  Weight: 173 lb (78.5 kg)  Height: 6' (1.829 m)   Body mass index is 23.46 kg/m.  left arm Incisions are healed, palpable radial pulse, hand grip is 5/5, sensation in digits is intact, palpable thrill, bruit can be auscultated     Medical Decision Making   Eusevio Schriver is a 61 y.o. year old male who presents s/p left second stage basilic vein transposition  Patent basilic vein fistula without signs or symptoms of steal syndrome The patient's access is ready for use today The patient's tunneled dialysis catheter can be removed when Nephrology is comfortable with the performance of the fistula The patient may follow up on a prn basis   Dagoberto Ligas PA-C [facility] [place] Office: [facility] MD: Trula Slade

## 2022-03-02 ENCOUNTER — Other Ambulatory Visit (HOSPITAL_COMMUNITY): Payer: Self-pay | Admitting: Nephrology

## 2022-03-02 DIAGNOSIS — N186 End stage renal disease: Secondary | ICD-10-CM

## 2022-03-05 ENCOUNTER — Ambulatory Visit (HOSPITAL_COMMUNITY)
Admission: RE | Admit: 2022-03-05 | Discharge: 2022-03-05 | Disposition: A | Discharge status: Home / Self Care | Payer: BC Managed Care – PPO | Source: Ambulatory Visit | Attending: Nephrology | Admitting: Nephrology

## 2022-03-05 DIAGNOSIS — N186 End stage renal disease: Secondary | ICD-10-CM | POA: Insufficient documentation

## 2022-03-05 DIAGNOSIS — Z452 Encounter for adjustment and management of vascular access device: Secondary | ICD-10-CM | POA: Diagnosis present

## 2022-03-05 HISTORY — PX: IR REMOVAL TUN CV CATH W/O FL: IMG2289

## 2022-03-05 MED ORDER — LIDOCAINE HCL 1 % IJ SOLN
INTRAMUSCULAR | Status: AC
  Filled 2022-03-05: qty 20

## 2022-03-05 MED ORDER — LIDOCAINE HCL (PF) 1 % IJ SOLN
INTRAMUSCULAR | Status: DC | PRN
  Administered 2022-03-05: 10 mL

## 2022-09-16 ENCOUNTER — Other Ambulatory Visit (HOSPITAL_COMMUNITY): Payer: Self-pay

## 2022-11-12 IMAGING — US IR FLUORO GUIDE CV LINE*R*
1 series · 1 of 1 positions shown · non-contrast
Comparison: none

INDICATION: ESRD requiring HD.

[Series 1: ir (id) (id)/(id)/(id) ir · 1 of 1 slices shown]
[im 1/1]
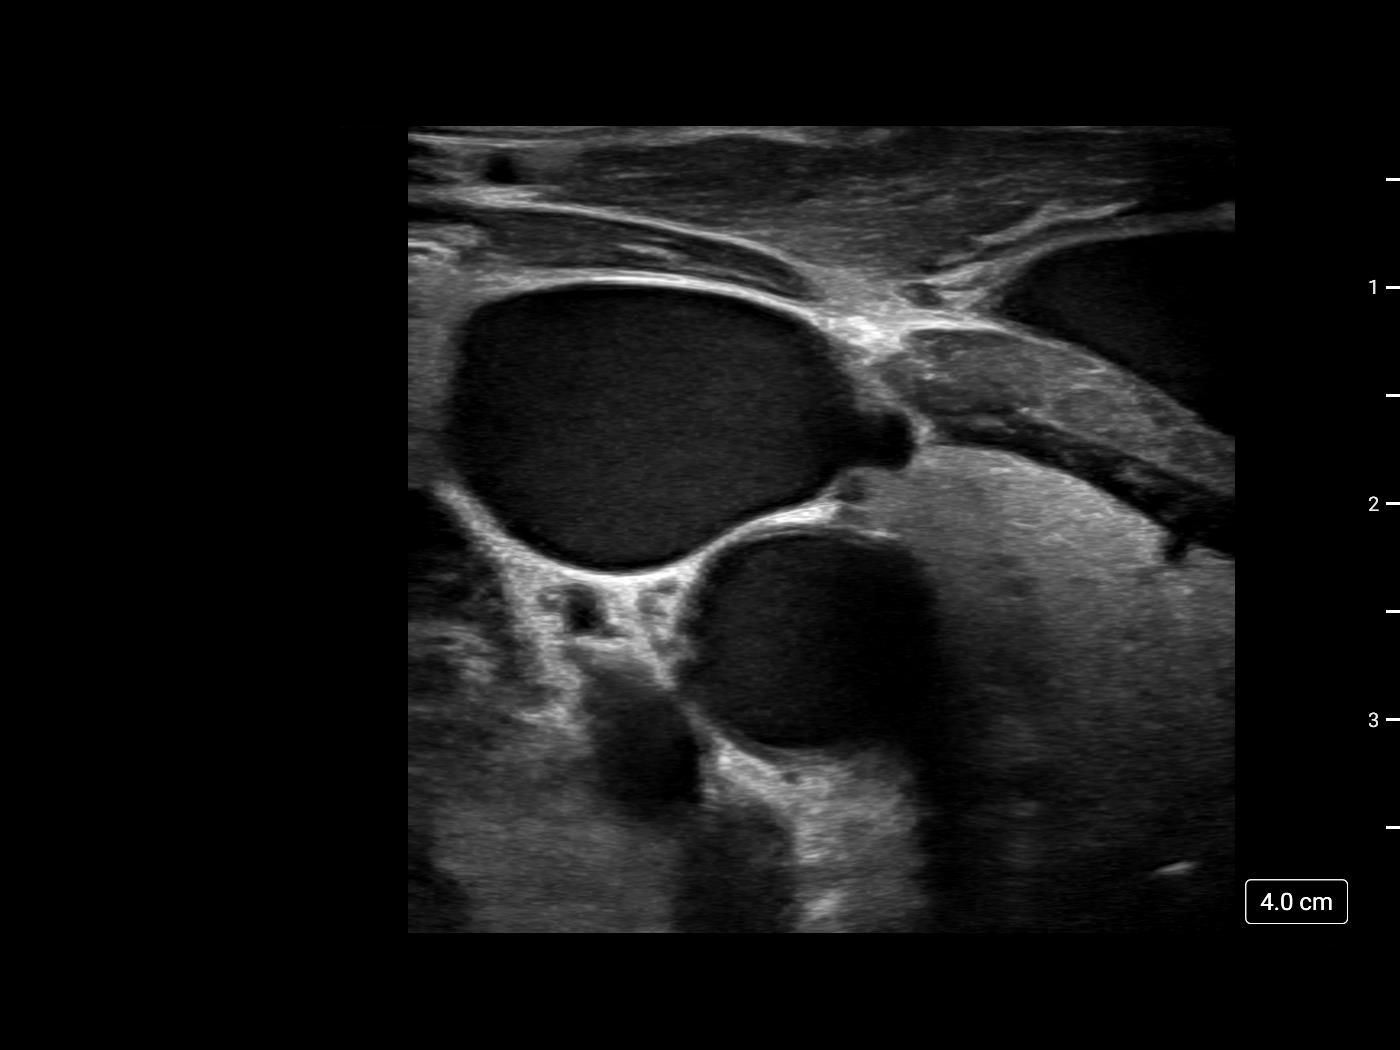

[1 of 1 positions shown; findings below may reference images not displayed]

EXAM:
TUNNELED CENTRAL VENOUS HEMODIALYSIS CATHETER PLACEMENT WITH
ULTRASOUND AND FLUOROSCOPIC GUIDANCE

MEDICATIONS:
None.

ANESTHESIA/SEDATION:
Moderate (conscious) sedation was employed during this procedure. A
total of Versed 1 mg and Fentanyl 50 mcg was administered
intravenously.

Moderate Sedation Time: 22 minutes. The patient's level of
consciousness and vital signs were monitored continuously by
radiology nursing throughout the procedure under my direct
supervision.

FLUOROSCOPY TIME:  0 minutes 18 seconds (1 mGy).

COMPLICATIONS:
None immediate.



After creating a small venotomy incision, a micropuncture kit was
utilized to access the internal jugular vein. Real-time ultrasound
guidance was utilized for vascular access including the acquisition
of a permanent ultrasound image documenting patency of the accessed
vessel. The microwire was utilized to measure appropriate catheter
length.

A stiff Glidewire was advanced to the level of the IVC and the
micropuncture sheath was exchanged for a peel-away sheath. A
tunneled hemodialysis catheter measuring 23 cm from tip to cuff was
tunneled in a retrograde fashion from the anterior chest wall to the
venotomy incision.

The catheter was then placed through the peel-away sheath with tips
ultimately positioned within the superior aspect of the right
atrium. Final catheter positioning was confirmed and documented with
a spot radiographic image. The catheter aspirates and flushes
normally. The catheter was flushed with appropriate volume heparin
dwells.

The catheter exit site was secured with a 2-0 nylon retention
suture. The venotomy incision was closed with Dermabond. Dressings
were applied. The patient tolerated the procedure well without
immediate post procedural complication.
IMPRESSION: Successful placement of 23 cm tip to cuff tunneled hemodialysis
catheter via the right internal jugular vein with tips terminating
within the superior aspect of the right atrium. The catheter is
ready for immediate use.

## 2023-05-29 IMAGING — US US ABDOMEN LIMITED
1 series · 14 of 25 positions shown · non-contrast
Comparison: Abdominal ultrasound 02/14/2021.

CLINICAL DATA: Hepatitis-B.

EXAM:
ULTRASOUND ABDOMEN LIMITED RIGHT UPPER QUADRANT

[Series 1: us abdomen limited · 0.22mm/px · 14 of 43 slices shown]
[im 1/43]
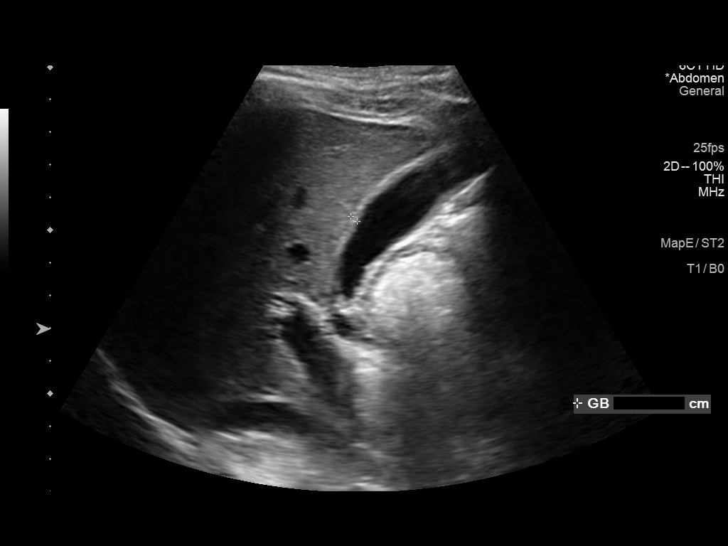
[im 4/43]
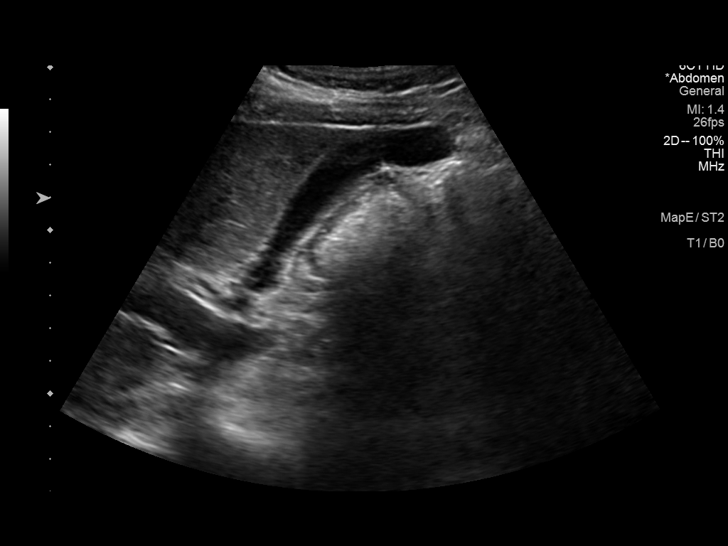
[im 8/43]
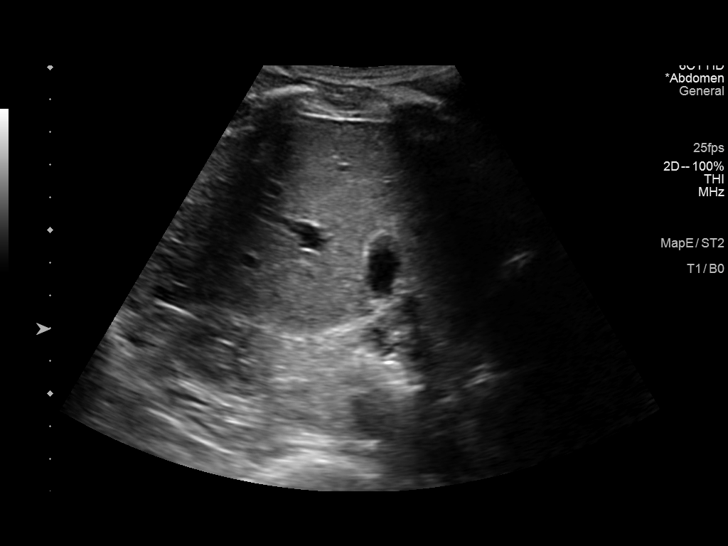
[im 11/43]
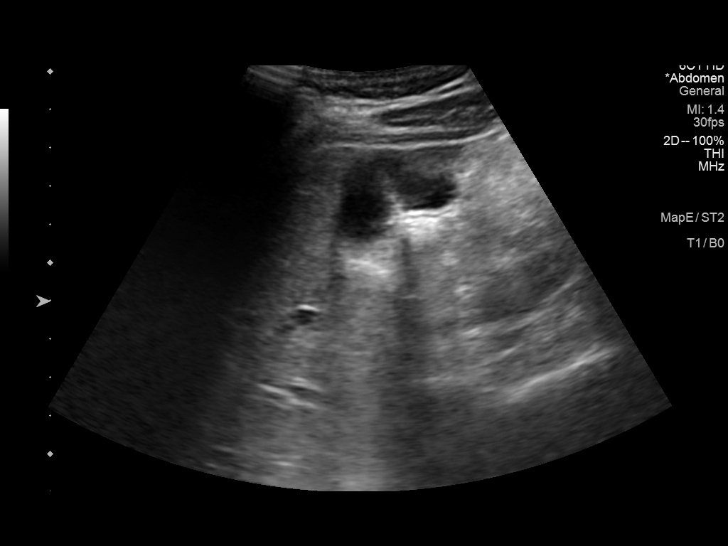
[im 15/43]
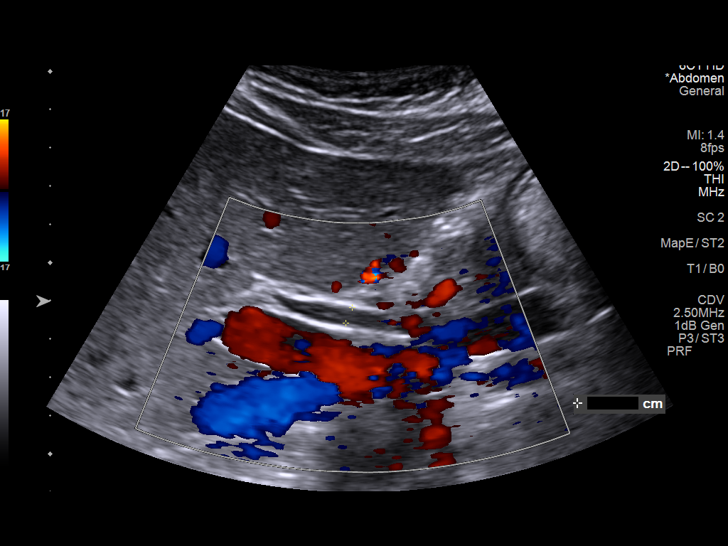
[im 16/43]
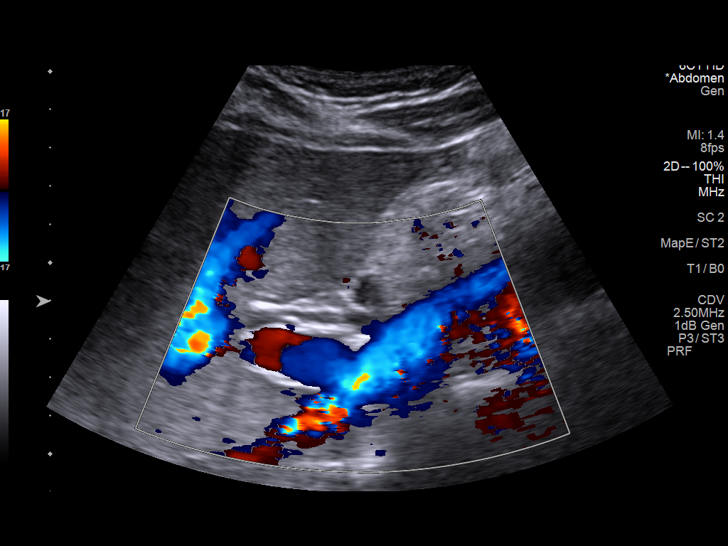
[im 20/43]
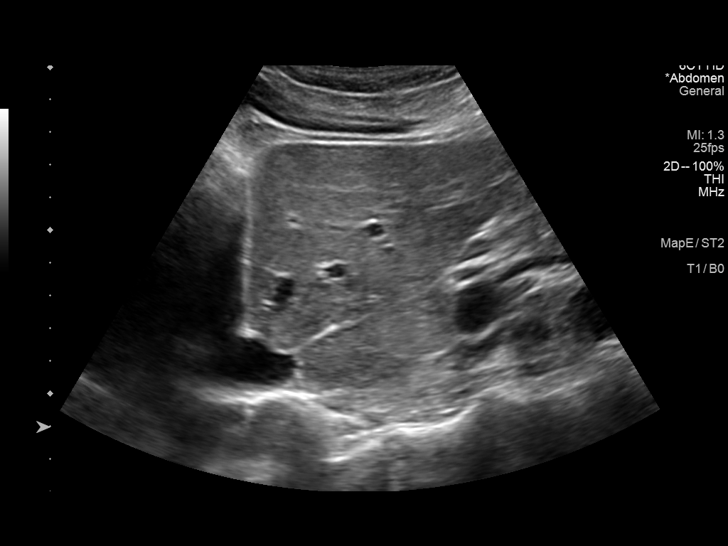
[im 23/43]
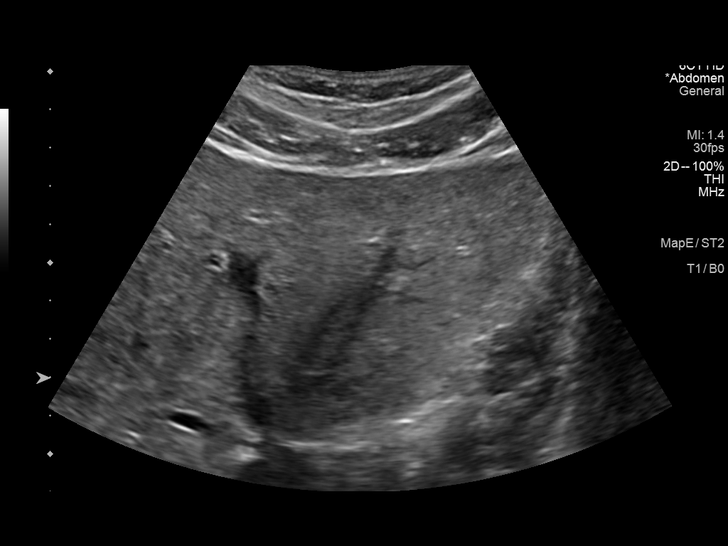
[im 27/43]
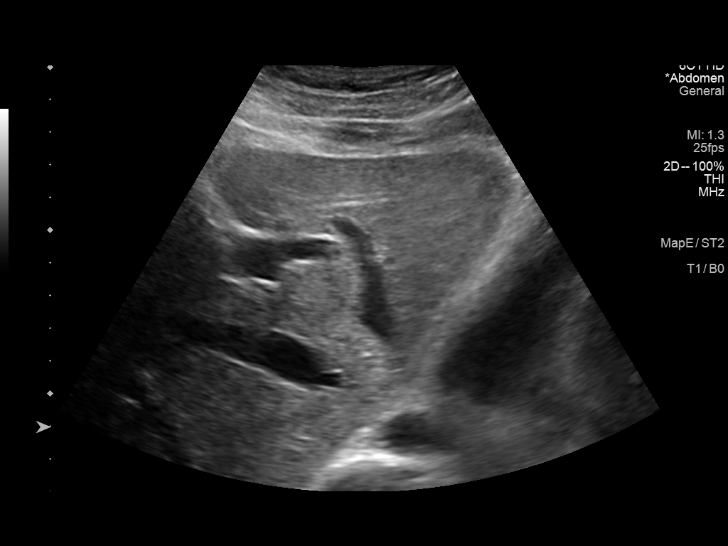
[im 29/43]
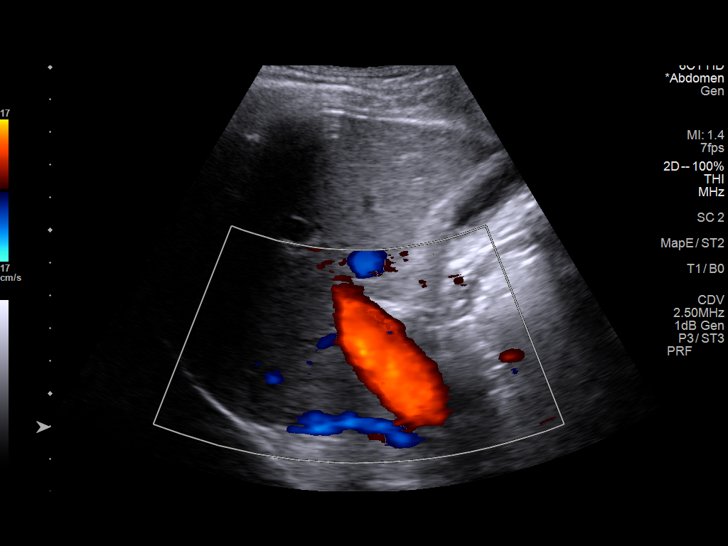
[im 32/43]
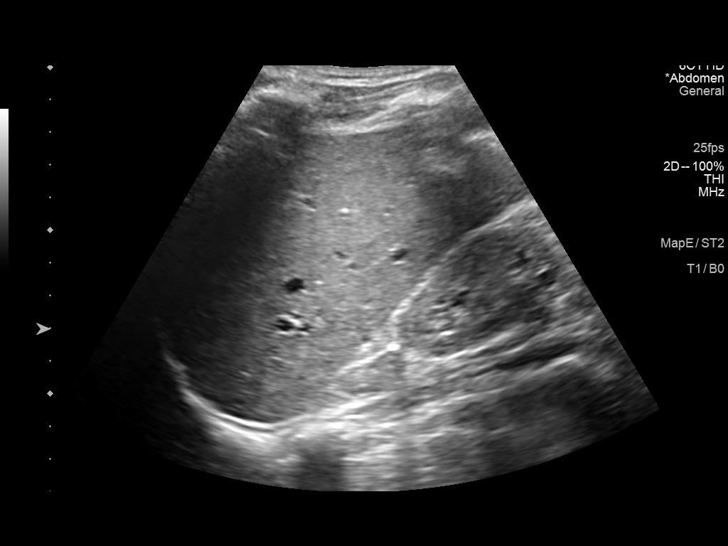
[im 36/43]
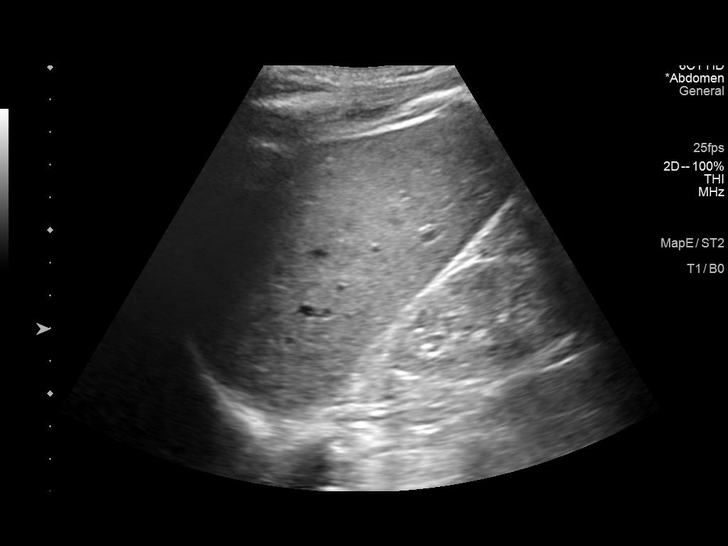
[im 39/43]
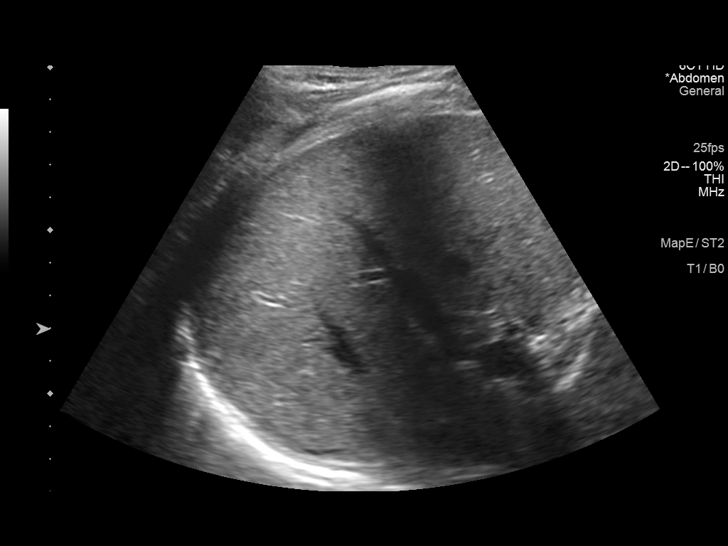
[im 43/43]
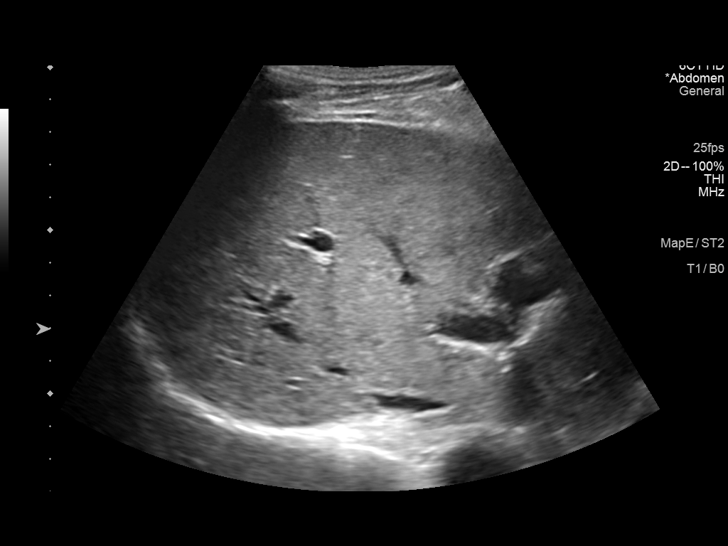

[14 of 25 positions shown; findings below may reference images not displayed]

FINDINGS: Gallbladder:

No gallstones or wall thickening visualized. No sonographic Murphy
sign noted by sonographer.

Common bile duct:

Diameter: 2.6 mm

Liver:

No focal lesion identified. Within normal limits in parenchymal
echogenicity. Portal vein is patent on color Doppler imaging with
normal direction of blood flow towards the liver.

Other: Again noted is increased echogenicity in the right kidney
similar to the prior study.
IMPRESSION: 1. No focal liver lesion.
2. No cholelithiasis.
3. Again seen is echogenic right kidney likely related to medical
renal disease.

## 2023-12-07 NOTE — Progress Notes (Signed)
 Referred by Dr. Rosalva Sanfilippo ([facility]) for glaucoma evaluation. Kidney transplant 07/15/22 (Dr. Theodoro) Hx prednisone use  Risk factors: African descent, Hypertension, Disc appearance, and IOP >21  CCT: 493 OU Thin pachs  Previous ocular procedures: s/p SLT OD 09/17/23, s/p SLT OS 10/14/23  Drop intolerance or allergies: none known  Tmax: unknown (38 OD, 34 OS on 08/04/23 at [facility])  Target IOP: Lteens OU  HVF 08/19/23 OD: Extinguished OS: Extinguished vs cloverleaf  No comparison   OCT RNFL 08/19/23 RNFL: OD: Severe global thin OS: Severe global thin Asymmetry Analysis: OD: Severe global thin OS: Severe global thin No comparison    Impression/Plan: 1. Primary open angle glaucoma (POAG) of both eyes, severe stage      2. Nuclear sclerotic cataract of both eyes        POAG Severe OU - Diagnosed late stage with near total cup OU and extinguished fields.  - Discussed with patient the nature of glaucoma, risk for irreversible vision loss and blindness if nonadherent with treatment or follow up. Glaucoma is a permanent, progressive condition requiring doctor-patient collaboration to achieve therapeutic goals.  - Advised against driving 96/79/7974  - Currently using: Latan QHS OU, Timolol QAM OU - continue  - IOP above target OU - s/p SLT OD 09/17/23  - s/p SLT OS 10/14/23 - Will start Brim TID OU and increase Timolol to BID - RTC in 4-6 weeks for IOP check  NS Cataract OU - NVS, monitor    Explained the diagnoses, plan, and follow up with the patient and they expressed understanding.  Patient expressed understanding of the importance of proper follow up care.   Return in about 4 weeks (around 01/06/2024) for IOP check.  This document serves as a record of services personally performed by Gillian G. Alena, MD. It was created on their behalf by Charmaine A. Georgina, a trained medical scribe. The creation of this record is the provider's dictation and/or activities  during the visit.

## 2023-12-09 NOTE — Progress Notes (Signed)
 Patient here for [facility] 24G PIV placed right AC.  Rollo Lawrance Lockwood, LPN

## 2023-12-10 NOTE — Progress Notes (Signed)
 Serum Cr stable. Tacrolimus level undetectable, patient is on belatacept.

## 2023-12-22 NOTE — Telephone Encounter (Signed)
 Labs from 11/08/2023 reviewed. Currently on PO Entecavir.     Hep B DNA: not detected.  AFP: 1.4.   Per last clinic note in 07/2023 per Dr. Verne. Due to follow-up in clinic ~ 12/2023 for management of chronic Hep B and HCC screening.   Currently has no appointment scheduled.   RN: Please call and offer patient scheduled follow-up in Transplant ID clinic.

## 2024-01-10 NOTE — Progress Notes (Signed)
 Transplant Clinic Follow up Visit  CC: Kidney transplant recipient, immunosuppression  Patient summary: Patient is a 63 y.o.  AA male with ESRD secondary to HTN who presented for deceased donor kidney transplant. 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 On 07/15/2022, he completed a DCD DDRT to the right pelvis. PRA 0%. HLA MM 2-2-0. CMV D-/ R+. Donor was a 63 y.o. BM, BMI 23, KDPI 56%. COD: ICB. Campath induction. Transplant ureteral stent placed. OnQ bupivacaine  nerve block pump was placed in the OR for pain control. While in the OR, the patient developed a large thrombus in a segment of his external iliac. Vascular surgery was called to assist and performed a thrombectomy both cephalad and caudal to the venotomy. A large thrombus was removed from the femoral vein. They noted the venous iliac axis cephalad to the venotomy was patent and spared by thrombi. Following the procedure, vascular study was completed that demonstrated that the allograft was well perfused. He was transfused with 3 units PRBCs. Patient tolerated the procedure well and did well post-operatively. Patient was maintained on a heparin  infusion after surgery and transitioned back to Eliquis  prior to discharge. He has chronic Hepatitis B and was started on entecavir Eveleen) at discharge.    Hematology was consulted for thrombocytopenia and thought to be multifactorial in etiology and has been long standing. Recommended DOAC therapy for chronic DVT.  Recommended considering evaluation of bone marrow examination potentially if counts do not improve.   Had DGF and underwent HD on 2/15. His renal function improved and he required no further dialysis during admission. Foley catheter was removed and PVRs checked prior to discharge. JP drain was removed.    Clinic HPI 01/10/2024: 1 year and 5 months out from DDKT. On eliquis  for hx of DVT. On Entecavir for chronic Hep B; follows with Dr. Verne. Here today for clinic  follow up and Bela infusion. Endorses 3-week hx of RLE swelling/pain. No skin changes, ulcers, lesions, erythema, or ecchymosis. Pain is mostly on the lateral aspect of the calf. 1+ pitting edema to RLE and it is slightly larger in diameter than the left leg. Edema appears to be from the knee down. Patient denies any trauma to the leg and has been taking his Eliquis  as prescribed without missing any doses. Patient also notes that his job requires him to stand up to 11 hours daily.       BP: 148/79 in clinic; on amiodarone , amlodipine, and coreg     BG: prediabetic A1c in March but decreased from November 2024; recheck A1c today     PO fluid intake: ~64 oz/day    UOP: WNL   UTI sxs: none   Lytes/supplements: on Veltassa; K+ WNL on most recent labs   IS: FK 0.5/0.5, MPA 180/180, pred 5mg  daily, monthly Bela   HM: UTD on vaccines and colonoscopy per patient, has upcoming Derm appt on 8/22   Weight: voluntary weight loss of 10 pounds in 1 month per patient, he has been trying to lose weight  Prophylaxis Currently taking Bactrim for prophylaxis.   Patient Active Problem List   Diagnosis Date Noted   . Anticoagulation adequate 08/11/2022  . Hypertension 08/04/2022  . Encounter for aftercare following kidney transplant (CMD) 08/04/2022  . Encounter for monitoring tacrolimus therapy 08/04/2022  . 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 Deceased-donor kidney transplant 07/17/2022  . Immunosuppression (HCC) 07/17/2022  . Chronic hepatitis B (HCC) 12/30/2021  .  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 Atrial fibrillation (HCC) 11/13/2021  . Proteinuria 12/02/2015    Resolved Problems   Diagnosis Date Noted Date Resolved  . ESRD on dialysis [facility]) 11/13/2021 08/11/2022   Allergies  Allergen Reactions  . Heparin  Analogues Thrombocytopenia    Possible HIT due to chronic thrombocytopenia possibly related to heparin  use during  dialysis. Do not use heparin  or analogues until further notice   Meds Ordered in [facility]  Medication Sig Dispense Refill  . acetaminophen  (TYLENOL ) 500 mg tablet Take 1 or 2 tablets by mouth every 6 hours as needed for pain    . amiodarone  (PACERONE ) 200 mg tablet Take 1 tablet (200 mg total) by mouth daily. 30 tablet 5  . amLODIPine (NORVASC) 5 mg tablet Take 1 tablet (5 mg total) by mouth daily. 90 tablet 0  . apixaban  (ELIQUIS ) 5 mg tab Take 1 tablet (5 mg total) by mouth 2 times daily. Start date: 07/20/22  Stop date: TBD 60 tablet 5  . atorvastatin  (LIPITOR) 40 mg tablet Take 1 tablet (40 mg total) by mouth daily. 30 tablet 5  . brimonidine (ALPHAGAN) 0.2 % ophthalmic solution Administer 1 drop into both eyes 3 (three) times a day. 10 mL 11  . carvediloL  (COREG ) 12.5 mg tablet Take 2 tablets (25 mg total) by mouth in the morning and 2 tablets (25 mg total) in the evening. Take with meals. 120 tablet 5  . entecavir (BARACLUDE) 0.5 mg tab tablet Take 1 tablet (0.5 mg total) by mouth daily. 30 tablet 3  . latanoprost (XALATAN) 0.005 % ophthalmic solution Administer 1 drop into both eyes at bedtime. 5 mL 11  . mycophenolate (MYFORTIC) 180 mg TbEC DR tablet Take 1 tablet (180 mg total) by mouth 2 (two) times a day. 60 tablet 3  . omeprazole (PriLOSEC) 20 mg DR capsule Take 1 capsule (20 mg total) by mouth daily before breakfast. 30 capsule 5  . patiromer calcium  sorbitex (VELTASSA) 8.4 gram pwpk packet Take 8.4 g by mouth daily. 30 packet 5  . polyethylene glycol (MIRALAX ) 17 gram powd powder Take 17 g by mouth daily as needed for constipation. 238 g 3  . predniSONE (DELTASONE) 5 mg tablet Take 1 tablet (5 mg total) by mouth daily. 30 tablet 11  . sulfamethoxazole-trimethoprim (BACTRIM) 400-80 mg per tablet Take 1 tablet by mouth Every Monday, Wednesday, Friday. 12 tablet 11  . tacrolimus (PROGRAF) 0.5 mg capsule Take 1 capsule (0.5 mg total) by mouth 2 (two) times a day. 60 capsule 5  . timolol  (TIMOPTIC) 0.5 % ophthalmic solution Administer 1 drop into both eyes 2 (two) times a day. 10 mL 11  . magnesium chloride (SLOW-MAG) 71.5 mg TbEC tablet Take 2 tablets (143 mg total) by mouth daily. (Patient not taking: Reported on 01/10/2024) 60 tablet 5   Current Facility-Administered Medications Ordered in Epic  Medication Dose Route Frequency Provider Last Rate Last Admin  . albuterol HFA (PROVENTIL HFA;VENTOLIN HFA;PROAIR HFA) 90 mcg/actuation inhaler 2 puff  2 puff inhalation Q4H PRN Lamar Fairy Plain, MD      . diphenhydrAMINE (BENADRYL) injection 50 mg  50 mg intravenous Once PRN Lamar Fairy Plain, MD      . EPINEPHrine  (EPIPEN ) 0.3 mg/0.3 mL injection syringe 0.3 mg  0.3 mg intramuscular PRN Lamar Fairy Plain, MD      . famotidine (Pepcid) injection 20 mg  20 mg intravenous Once PRN Lamar Fairy Plain, MD      .  methylPREDNISolone sodium succinate (PF) (SOLU-Medrol) injection 40 mg  40 mg intravenous Once PRN Lamar Fairy Plain, MD      . sodium chloride  (bolus) 0.9 % bolus 500 mL  500 mL intravenous Once PRN Lamar Fairy Plain, MD       Objective:  Physical Exam:  Patient alert and oriented, not acute distress Head: Normocephalic Eyes: No icterus. EOMI. Throat: Mucous membranes moist and tongue normal. Neck: no adenopathy, supple, trachea midline Lungs: Clear to auscultation bilaterally, no wheezing or crackles. Heart: regular rate and rhythm Abdomen: soft, non-tender; bowel sounds active Allograft: RLQ renal allograft nontender, no bruits, incision well healed Extremities: no edema of lower extremities. Skin: Normal turgor. No rashes or lesions Neurologic: No apparent focal deficits.  Psychiatric: Normal mood and affect. Judgment and thought content normal.  Nursing note and vitals reviewed.   Labs: Pending and will be reviewed later today. Will check FK506 levels for drug toxicity, cytopenias with immunosuppression, electrolyte management and following  kidney transplant renal function for monitoring of rejection or other renal disorders or technical complications.   Assessment: Antonio Keller is seen in followup of kidney transplant. ESRD secondary to HTN who completed a DCD DDRT on 07/15/2022. 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 PRA 0%. HLA MM 2-2-0.  Plan: Renal: S/P DCD DDRT on 07/15/22 with DGF s/p HD 07/16/21. Renal function improving with good urine output. Given Hx of DVT of femoral vein, a repeat doppler was performed on 2/23: No evidence of any critical transplant renal artery stenosis. Study does not exclude accessory or branch level disease. No evidence of any occlusive transplant renal vein thrombosis with good iliac vein outflow. Partially occlusive deep vein thrombosis of the external iliac vein distal to the renal vein anastomosis and common femoral vein. Pt to be on DOAC with eliquis  indefinitely.  Renal US  08/18/22 Status post right lower quadrant renal transplant. Patent vasculature with normal resistive indices. No evidence for hydronephrosis. Decreasing/small perinephric fluid collection. No mass effect. New small subincisional fluid collection. Kidney function stable today. No proteinuria.  - Continue monthly Bela infusions  Lab Results  Component Value Date   CREATININE 1.66 (H) 01/10/2024   CREATININE 1.77 (H) 12/09/2023   Lab Results  Component Value Date   PROTCRE 77 07/15/2023   PROTCRE 62 06/17/2023   Will check BK pcr monthly.   Immunosuppression: Patient status post Campath induction. Continue current regimen of FK 0.5/0.5, MPA 180/180, pred 5mg  daily, monthly Bela. Patient will remain on steroids (prednisone) long term. Goal FK level: 6-9.   Volume status: No edema. Clinically euvolemic.  Post op Issues:  While in the OR, the patient developed a large thrombus in a segment of his external iliac vein. Vascular surgery was called to assist and performed a  thrombectomy both cephalad and caudal to the venotomy. A large thrombus was removed from the femoral vein. They noted the venous iliac axis cephalad to the venotomy was patent and spared by thrombi. He needs follow up with vascular surgery - referral placed today. Thrombophilia panel ordered, Factor V Leiden Mutation not present. Stent removed 08/11/2022. 1 month biopsy cancelled and patient re-started Eliquis .  Eliquis  should not be held in patient given surgical complications as noted above.   Renal doppler 07/15/2022 Right: Partially occlusive deep vein thrombosis of the iliac vein proximal to transplant anastomosis. Transplant renal vein and artery appear to be patent.  LE doppler 07/16/2022 RIGHT: Partially occlusive chronic deep vein thrombosis with reduced  venous flow of the right common femoral vein. The common femoral vein shows flow only on augmentation. There is a prominent venous collateral vessel off the posterior, distal common femoral vein that appears to supply collateral return flow to the distal external iliac vein. No evidence of any acute deep vein thrombosis in the right lower extremity. No evidence of any additional chronic deep vein thrombosis below the common femoral vein. LEFT: No evidence of deep vein thrombosis or venous obstruction in the left lower extremity.  New RLE swelling: patient notes new RLE swelling and lateral calf pain x3 weeks. Has not skipped any doses of Eliquis , but will obtain RLE doppler ultrasound to r/o DVT  ID prophylaxis: Bactrim long term for PCP prophylaxis.  Chronic hepatitis B: Patient on Entecavir 0.5 mg daily for chronic hep b, dosed for renal function. Follows with Dr. Verne.   Blood pressure- Asymptomatic orthostatic hypotension - Current blood pressure regimen: Amlodipine and Carvedilol   - Will continue to monitor.  Anemia: stable and mild, check CBC today. Lab Results  Component Value Date   WBC 4.10 (L) 01/10/2024   HGB 12.5 (L)  01/10/2024   HCT 37.7 (L) 01/10/2024   MCV 82.2 01/10/2024   PLT 149 (L) 01/10/2024   Thrombocytopenia: Mild. No evidence of hemolysis. Negative HIT Ab and normal haptoglobin 07/19/21. Seen by hem/onc in the hospital.  Hyperlipidemia: Continue statin. Lab Results  Component Value Date   CHOL 106 06/17/2023   CHOL 124 11/13/2021   Lab Results  Component Value Date   HDL 51 (L) 06/17/2023   HDL 56 (L) 11/13/2021   Lab Results  Component Value Date   LDLCALC 39 06/17/2023   Lab Results  Component Value Date   TRIG 79 06/17/2023   TRIG 47 11/13/2021   Electrolytes: on Veltassa; K+ WNL on most recent labs Lab Results  Component Value Date   NA 142 01/10/2024   K 4.1 01/10/2024   CL 107 01/10/2024   CO2 27 01/10/2024   Lab Results  Component Value Date   CALCIUM  9.8 01/10/2024   PHOS 2.4 (L) 01/10/2024   Lab Results  Component Value Date   MG 1.8 (L) 01/10/2024   Atrial fibrillation: Patient chronically on amiodarone  and a beta blocker. Anticoagulated as above.   Follow up: follow up RLE doppler US  to r/o DVT; RTC 2 weeks if labs are stable.   Electronically signed by: Guido Almarie Prose, MD

## 2024-01-10 NOTE — Progress Notes (Signed)
 TXP prograf 0.5/0.5 Patient reports last dose taken last night at 8 pm  Intake approximately 64 ounces of fluid intake daily UOP: WNL  S/S of urinary tract infection: no symptoms reported Medication review completed: no changes in med list  Concerns today: sswelling in right leg  Antonio Keller, CMA

## 2024-01-10 NOTE — Progress Notes (Signed)
 Referred by Dr. Rosalva Sanfilippo ([facility]) for glaucoma evaluation. Kidney transplant 07/15/22 (Dr. Theodoro) Hx prednisone use  Risk factors: African descent, Hypertension, Disc appearance, and IOP >21  CCT: 493 OU Thin pachs  Previous ocular procedures: s/p SLT OD 09/17/23, s/p SLT OS 10/14/23  Drop intolerance or allergies: none known  Tmax: unknown (38 OD, 34 OS on 08/04/23 at [facility])  Target IOP: Lteens OU  HVF 08/19/23 OD: Extinguished OS: Extinguished vs cloverleaf  No comparison   OCT RNFL 08/19/23 RNFL: OD: Severe global thin OS: Severe global thin Asymmetry Analysis: OD: Severe global thin OS: Severe global thin No comparison    Impression/Plan: 1. Primary open angle glaucoma (POAG) of both eyes, severe stage  Humphrey Visual Field - OU - Both Eyes    2. Nuclear sclerotic cataract of both eyes        POAG Severe OU - Diagnosed late stage with near total cup OU and extinguished fields.  - Discussed with patient the nature of glaucoma, risk for irreversible vision loss and blindness if nonadherent with treatment or follow up. Glaucoma is a permanent, progressive condition requiring doctor-patient collaboration to achieve therapeutic goals.  - Advised against driving 96/79/7974  - s/p SLT OD 09/17/23  - s/p SLT OS 10/14/23  - Currently using: Latan QHS OU, Timolol BID OU, Brim TID OU - continue  - IOP borderline but much improved OU - RTC in 6 weeks for HVF 10-2 OU  NS Cataract OU - NVS, monitor  - Mrx dispensed today 01/11/2024   Explained the diagnoses, plan, and follow up with the patient and they expressed understanding.  Patient expressed understanding of the importance of proper follow up care.   Return in about 6 weeks (around 02/22/2024) for HVF 10-2.  This document serves as a record of services personally performed by Gillian G. Alena, MD. It was created on their behalf by Charmaine A. Georgina, a trained medical scribe. The creation of this record  is the provider's dictation and/or activities during the visit.

## 2024-01-10 NOTE — Progress Notes (Signed)
 Patient here for [facility] 24G PIV placed right AC.  Rollo Lawrance Lockwood, LPN

## 2024-01-10 NOTE — Telephone Encounter (Addendum)
 Called and spoke with patient. Discussed results of Doppler study being negative for DVT. Advised to start wearing compression socks - educated on insuring proper size/fit. Verbalized understanding. All questions/concerns addressed.   Electronically signed by: Rosina Public, RN 01/10/2024 3:37 PM     ----- Message from Guido Prose, MD sent at 01/10/2024  3:23 PM EDT ----- Good afternoon,  I saw this patient in clinic today, he is almost 1.5 years out from DDKT on Eliquis  for hx of a-fib and RLE DVT and complained today of new 3-week history of right lower leg edema/lateral calf pain. 1+ pitting edema to RLE and it is slightly larger in diameter than the left leg. Edema appears to be from the knee down. Patient denies any trauma to the leg and has been taking his Eliquis  as prescribed without missing any doses. Patient also notes that his job requires him to stand up to 11 hours daily. I got a RLE DVT ultrasound today that was negative for DVT.  Rosina, can you let the patient know the results of this ultrasound and ask him to start wearing compression socks if he doesn't already? He will see us  back in 2 weeks for follow up. Dr. Lucianne, please let me know if you recommend any further workup for the RLE swelling.        Thank you!  Arianna

## 2024-01-21 NOTE — Progress Notes (Signed)
 HIGH RISK CUTANEOUS ONCOLOGY CLINIC  [facility] Department of Dermatology   Referring provider:  Wilbert Look, [facility] [address] [place],  [place] [address] Chief Complaint: Total body skin examination  Date of Visit: 01/21/2024 Last Visit: New patient   Subjective:  Antonio Keller is a 63 y.o. male who presents as a new patient to the high risk cutaneous oncology clinic today for a complete evaluation of skin lesions and nevi.   History of DDKT in 07/2022. No lesions of concern today.   Transplant History:  Year: 07/2022 Immunosuppression regimen: Tacrolimus, Mycophenolate, Prednisone,  Belatacept infusions  Melanoma/Nevi history:  Date Diagnosis Location [facility] [address] Surgeon BRAF Other therapy (RT, immunotherapy, chemotherapy)                              Non-melanoma skin cancer history:  Date Diagnosis Location Treatment (exc, ED&C, topical) Surgeon                     Preventive measures:    ROS: Negative for any other lumps, swelling, or dermatologic conditions. Negative for fever, chills, or unintentional weight loss.  Past Medical History: Medical History[1]  Family History: Family history of skin cancer: No  Objective: Vital Signs:  BP (!) 139/103 (BP Location: Right arm, Patient Position: Sitting)   Pulse 108   Ht 1.829 m (6')   Wt 94.3 kg (208 lb)   BMI 28.21 kg/m   Patient is well nourished, well hydrated 63 y.o. male.  Fitzpatrick skin type 5 In no acute distress with appropriate mood and affect who appears alert and oriented. Exam today focused on the skin and included inspection and palpation of the following areas:  Scalp and hair Head (Face, Ears, Nose) Lips, teeth and gums Eyes (Eyelids and conjunctivae) Neck Chest (including breasts and axillae) Abdomen Back Right upper extremity Left upper extremity Right lower extremity Left lower extremity Palms, finger, and fingernails   Soles, toes, and toenails Genitalia, groin, buttocks  Notable Findings include: Hyperpigmented macules, c/w benign moles. None meeting ABCDE criteria. Significant bilateral lower extremity edema   Assessment: 1. Deceased-donor kidney transplant      2. Immunosuppression (HCC)         Cumulative incidence of skin cancer: Avg 5 year risk 1.01%, Avg 10 year risk 2.33% Low risk  Plan: - The above diagnosis and treatment options were discussed with the patient. - Prescriptions provided today: none  - Recommend avoidance of direct sun during peak hours of sunscreens, protective clothing and a wide brimmed hat while out in the sun. - RTC PRN         [1] Past Medical History: Diagnosis Date  . CHF (congestive heart failure)    (CMD)   . ESRD on dialysis (HCC) 11/13/2021  . Glaucoma   . History of hypertension   . Hypertension   . Transfusion history

## 2024-01-21 NOTE — Progress Notes (Signed)
 I saw and evaluated the patient. I reviewed the trainee's note and agree. I performed the service or was physically present during the critical or key portions of the service furnished by the trainee; and I managed the patient.  Wanda Dee, MD 01/21/2024

## 2024-01-24 NOTE — Progress Notes (Signed)
 Transplant Clinic Follow up Visit  CC: Kidney transplant recipient, immunosuppression  Patient summary: Patient is a 63 y.o.  AA male with ESRD secondary to HTN who presented for deceased donor kidney transplant. 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 On 07/15/2022, he completed a DCD DDRT to the right pelvis. PRA 0%. HLA MM 2-2-0. CMV D-/ R+. Donor was a 63 y.o. BM, BMI 23, KDPI 56%. COD: ICB. Campath induction. Transplant ureteral stent placed. OnQ bupivacaine  nerve block pump was placed in the OR for pain control. While in the OR, the patient developed a large thrombus in a segment of his external iliac. Vascular surgery was called to assist and performed a thrombectomy both cephalad and caudal to the venotomy. A large thrombus was removed from the femoral vein. They noted the venous iliac axis cephalad to the venotomy was patent and spared by thrombi. Following the procedure, vascular study was completed that demonstrated that the allograft was well perfused. He was transfused with 3 units PRBCs. Patient tolerated the procedure well and did well post-operatively. Patient was maintained on a heparin  infusion after surgery and transitioned back to Eliquis  prior to discharge. He has chronic Hepatitis B and was started on entecavir Eveleen) at discharge.    Hematology was consulted for thrombocytopenia and thought to be multifactorial in etiology and has been long standing. Recommended DOAC therapy for chronic DVT. Recommended considering evaluation of bone marrow examination potentially if counts do not improve.   Had DGF and underwent HD on 2/15. His renal function improved and he required no further dialysis during admission. Foley catheter was removed and PVRs checked prior to discharge. JP drain was removed.    Clinic HPI 01/24/2024: Antonio Keller presents to post-transplant clinic today for a follow up visit, s/p DDKT 07/15/2022 (Kidney). Overall, they are  doing well and have no concerns today. Patient is 1 year and 6 months out from txp, previously referred back to primary nephrologist 09/2023 but does not yet have an appointment with Dr. Dolan. On Entecavir for chronic Hep B; follows with Dr. Verne. On Eliquis  for hx of DVT. Presented to clinic 2 weeks ago with c/o RLE swelling/pain. US  venous from 8/11 negative for DVT. Reports improvement in LE swelling. He is usually on his feet > 11 hours/day when working, has not worked as much in recent weeks. Does not utilize compression stockings but does elevate legs nightly. Otherwise, the patient is drinking 64 oz of fluids/day with good UOP. They deny dysuria, hematuria, increased frequency/urgency, strong odor, or other LUTS. Appetite is good with no nausea or vomiting and patient is having normal BMs. Denies diarrhea or constipation. Their weight is down 4 lbs, intentionally trying to lose weight. Blood pressures are stable at home and are running around 110-120/70s on Coreg  25 mg BID and Amlodipine 5 mg daily. A1c in preDM range at 6.1%. They are compliant with their medications and have not missed any IS doses. Current IS: Prograf 0.5 mg BID, MPA 180 mg BID, Prednisone 5 mg daily, and monthly Bela. Currently taking Bactrim for prophylaxis. Last dose of Prograf was 8:00 pm with labs this morning.   ROS: A complete ROS was performed and negative unless otherwise mentioned in HPI.  Problem List[1]  Social History[2]  Surgical History[3]  Family History[4]  Allergies[5]  Medications Ordered Prior to Encounter[6]  Objective: Vitals:   01/24/24 1050 01/24/24 1053  BP: 114/77 100/76  Pulse: 93 (!) 112  Temp: 98.2 F (  36.8 C)   TempSrc: Temporal   SpO2: 99%   Weight: 92.9 kg (204 lb 12.8 oz)    Body mass index is 27.78 kg/m. Wt Readings from Last 3 Encounters:  01/24/24 92.9 kg (204 lb 12.8 oz)  01/21/24 94.3 kg (208 lb)  01/10/24 94.8 kg (208 lb 14.4 oz)   Physical Exam:  General  Appearance:  63 y.o. alert and oriented x 3. No acute distress.  Head:  Normocephalic, atraumatic  Eyes:  Conjunctivae clear. No scleral icterus.  Neck: Neck supple, trachea midline. No adenopathy, no thyroid  megaly, no overt JVD, no tenderness, no masses  Throat:  Moist mucous membranes;  No thrush or ulcers.  Chest/Lungs:  Respirations unlabored. Clear to auscultation bilaterally, no wheezes, rhonchi or rales.  Heart:  Regular rate and rhythm, no murmur, click, rub or gallop   Abdomen:  Soft, non-tender; positive bowel sounds throughout; no masses, no organomegaly. No HDM, no rebound, no guarding.   Allograft:  RLQ renal allograft nontender, no bruits, incision clean and dry with no signs of infection  Extremities:  2+ peripheral pulses. No cyanosis or edema  Skin:  Good skin turgor. No rashes or lesions.    Neurologic:  No focal deficits. No tremor.   Psych:  Judgement and memory intact. Affect appropriate.   Nursing note and vitals reviewed.   Labs: Pending and will be reviewed later today. Will check FK506 levels for drug toxicity, cytopenias with immunosuppression, electrolyte management and following kidney transplant renal function for monitoring of rejection or other renal disorders or technical complications.   Assessment: Antonio Keller is seen in followup of kidney transplant. ESRD secondary to HTN who completed a DCD DDRT on 07/15/2022. 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 PRA 0%. HLA MM 2-2-0.  Plan: Renal: S/P DCD DDRT on 07/15/22 with DGF s/p HD 07/16/21. Renal function improving with good urine output. Given Hx of DVT of femoral vein, a repeat doppler was performed on 2/23: No evidence of any critical transplant renal artery stenosis. Study does not exclude accessory or branch level disease. No evidence of any occlusive transplant renal vein thrombosis with good iliac vein outflow. Partially occlusive deep vein thrombosis of the  external iliac vein distal to the renal vein anastomosis and common femoral vein. Pt to be on DOAC with eliquis  indefinitely.  Renal US  08/18/22 Status post right lower quadrant renal transplant. Patent vasculature with normal resistive indices. No evidence for hydronephrosis. Decreasing/small perinephric fluid collection. No mass effect. New small subincisional fluid collection. Kidney function improved from prior today. No proteinuria. Continue monthly Bela infusions Lab Results  Component Value Date   CREATININE 1.46 (H) 01/24/2024   CREATININE 1.66 (H) 01/10/2024   Lab Results  Component Value Date   PROTCRE 77 07/15/2023   PROTCRE 62 06/17/2023   Will check BK pcr monthly.   Immunosuppression: Patient status post Campath induction. Continue current regimen of: FK 0.5/0.5; Goal FK level: 6-8. FK level pending.  MPA 180/180 mg twice daily Pred 5 mg daily; Patient will remain on steroids (prednisone) long term.  monthly Bela.   Volume status: No edema. Clinically euvolemic. Intentional weight loss.  Wt Readings from Last 3 Encounters:  01/24/24 92.9 kg (204 lb 12.8 oz)  01/21/24 94.3 kg (208 lb)  01/10/24 94.8 kg (208 lb 14.4 oz)    Post op Issues: While in the OR, the patient developed a large thrombus in a segment of his external iliac vein. Vascular surgery  was called to assist and performed a thrombectomy both cephalad and caudal to the venotomy. A large thrombus was removed from the femoral vein. They noted the venous iliac axis cephalad to the venotomy was patent and spared by thrombi. He needs follow up with vascular surgery - referral placed today. Thrombophilia panel ordered, Factor V Leiden Mutation not present. Stent removed 08/11/2022. 1 month biopsy cancelled and patient re-started Eliquis .  Eliquis  should not be held in patient given surgical complications as noted above.   Renal doppler 07/15/2022 Right: Partially occlusive deep vein thrombosis of the iliac vein  proximal to transplant anastomosis. Transplant renal vein and artery appear to be patent.  LE doppler 07/16/2022 RIGHT: Partially occlusive chronic deep vein thrombosis with reduced venous flow of the right common femoral vein. The common femoral vein shows flow only on augmentation. There is a prominent venous collateral vessel off the posterior, distal common femoral vein that appears to supply collateral return flow to the distal external iliac vein. No evidence of any acute deep vein thrombosis in the right lower extremity. No evidence of any additional chronic deep vein thrombosis below the common femoral vein. LEFT: No evidence of deep vein thrombosis or venous obstruction in the left lower extremity.  New RLE swelling: patient notes new RLE swelling and lateral calf pain x 3 weeks. Has not skipped any doses of Eliquis , but will obtain RLE doppler ultrasound to r/o DVT. US  01/10/2024 negative for DVT. Encouraged use of compression stockings, patient can go to medical supply store for correct fit.   ID prophylaxis: Bactrim long term for PCP prophylaxis.  Chronic hepatitis B: Patient on Entecavir 0.5 mg daily for chronic hep b, dosed for renal function. Follows with Dr. Verne.   Blood pressure: Asymptomatic orthostatic hypotension Current blood pressure regimen: Amlodipine and Carvedilol   Will continue to monitor.  Anemia: stable and mild, check CBC today. Lab Results  Component Value Date   WBC 4.30 (L) 01/24/2024   HGB 12.0 (L) 01/24/2024   HCT 37.2 (L) 01/24/2024   MCV 81.5 01/24/2024   PLT 150 01/24/2024   Thrombocytopenia:  Mild. No evidence of hemolysis. Negative HIT Ab and normal haptoglobin 07/19/2021. Seen by hem/onc in the hospital.  Hyperlipidemia: Continue statin. Lab Results  Component Value Date   CHOL 106 06/17/2023   CHOL 124 11/13/2021   Lab Results  Component Value Date   HDL 51 (L) 06/17/2023   HDL 56 (L) 11/13/2021   Lab Results  Component Value Date    LDLCALC 39 06/17/2023   Lab Results  Component Value Date   TRIG 79 06/17/2023   TRIG 47 11/13/2021   Electrolytes:  Hyperkalemia: on Veltassa; K+ WNL today, can d/c Veltassa.  Lab Results  Component Value Date   NA 141 01/24/2024   K 3.9 01/24/2024   CL 105 01/24/2024   CO2 27 01/24/2024   Lab Results  Component Value Date   CALCIUM  9.6 01/24/2024   PHOS 2.3 (L) 01/24/2024   Lab Results  Component Value Date   MG 1.8 (L) 01/10/2024   Atrial fibrillation: Patient chronically on amiodarone  and a beta blocker. Anticoagulated as above.   Follow up: RTC in 2 weeks for Bela/labs only. RTC in 6 weeks for next bela infusion/provider visit. Checking on status of refer back to Dr. Dolan today.   Depending on patient preference, staffing availability and clinic volume on scheduled clinic day, the patient may be changed to an RCE (return care extender), Nurse Visit, or Lab  only visit if deemed clinically stable.   Electronically signed by: Rocky Lum Ada, PA-C 01/24/2024 2:40 PM  I have personally spent 45 minutes involved in face-to-face and non-face-to-face activities for this patient on the day of the visit. Professional time spent includes the following activities, in addition to those noted in the documentation: chart review, lab review, imaging review, discussion with other members of care team, placing orders and coordinating care, patient education and counseling , medication counseling/reconciliation, and updating records in EMR.      [1] Patient Active Problem List Diagnosis  . Proteinuria  . Combined systolic and diastolic heart failure (HCC)  . Atrial fibrillation (HCC)  . Chronic hepatitis B (HCC)  . Deceased-donor kidney transplant  . Immunosuppression (HCC)  . 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 Hypertension  . Encounter for aftercare following kidney transplant (CMD)  . Encounter for monitoring tacrolimus therapy  .  Anticoagulation adequate  [2] Social History Socioeconomic History  . Marital status: Married  Tobacco Use  . Smoking status: Never    Passive exposure: Past  . Smokeless tobacco: Never  Vaping Use  . Vaping status: Never Used  Substance and Sexual Activity  . Alcohol use: Never  . Drug use: Never   Social Drivers of Health   Food Insecurity: Low Risk  (01/19/2023)   Food vital sign   . Within the past 12 months, you worried that your food would run out before you got money to buy more: Never true   . Within the past 12 months, the food you bought just didn't last and you didn't have money to get more: Never true  Transportation Needs: No Transportation Needs (01/19/2023)   Transportation   . In the past 12 months, has lack of reliable transportation kept you from medical appointments, meetings, work or from getting things needed for daily living? : No  Safety: Low Risk  (10/11/2023)   Safety   . How often does anyone, including family and friends, physically hurt you?: Never   . How often does anyone, including family and friends, insult or talk down to you?: Never   . How often does anyone, including family and friends, threaten you with harm?: Never   . How often does anyone, including family and friends, scream or curse at you?: Never  Living Situation: Low Risk  (01/19/2023)   Living Situation   . What is your living situation today?: I have a steady place to live   . Think about the place you live. Do you have problems with any of the following? Choose all that apply:: None/None on this list  [3] Past Surgical History: Procedure Laterality Date  . KIDNEY TRANSPLANT N/A 07/15/2022   Procedure: KIDNEY TRANSPLANT CADAVERIC;  Surgeon: Theodoro Hatchet, MD;  Location: [facility] MAIN OR;  Service: Transplant;  Laterality: N/A;  Right Kidney on pump   UNOS# [id number]    Bench at 10 am  Pt in room at 10:30 am  [4] No family history on file. [5] Allergies Allergen Reactions  . Heparin   Analogues Thrombocytopenia    Possible HIT due to chronic thrombocytopenia possibly related to heparin  use during dialysis. Do not use heparin  or analogues until further notice  [6] Current Outpatient Medications on File Prior to Visit  Medication Sig Dispense Refill  . acetaminophen  (TYLENOL ) 500 mg tablet Take 1 or 2 tablets by mouth every 6 hours as needed for pain    . amiodarone  (PACERONE ) 200 mg  tablet Take 1 tablet (200 mg total) by mouth daily. 30 tablet 5  . amLODIPine (NORVASC) 5 mg tablet Take 1 tablet (5 mg total) by mouth daily. 90 tablet 0  . apixaban  (ELIQUIS ) 5 mg tab Take 1 tablet (5 mg total) by mouth 2 times daily. Start date: 07/20/22  Stop date: TBD 60 tablet 5  . atorvastatin  (LIPITOR) 40 mg tablet Take 1 tablet (40 mg total) by mouth daily. 30 tablet 5  . brimonidine (ALPHAGAN) 0.2 % ophthalmic solution Administer 1 drop into both eyes 3 (three) times a day. 10 mL 11  . carvediloL  (COREG ) 12.5 mg tablet Take 2 tablets (25 mg total) by mouth in the morning and 2 tablets (25 mg total) in the evening. Take with meals. 120 tablet 5  . entecavir (BARACLUDE) 0.5 mg tab tablet Take 1 tablet (0.5 mg total) by mouth daily. 30 tablet 3  . latanoprost (XALATAN) 0.005 % ophthalmic solution Administer 1 drop into both eyes at bedtime. 5 mL 11  . magnesium chloride (SLOW-MAG) 71.5 mg TbEC tablet Take 2 tablets (143 mg total) by mouth daily. 60 tablet 5  . mycophenolate (MYFORTIC) 180 mg TbEC DR tablet Take 1 tablet (180 mg total) by mouth 2 (two) times a day. 60 tablet 3  . omeprazole (PriLOSEC) 20 mg DR capsule Take 1 capsule (20 mg total) by mouth daily before breakfast. 30 capsule 5  . patiromer calcium  sorbitex (VELTASSA) 8.4 gram pwpk packet Take 8.4 g by mouth daily. 30 packet 5  . polyethylene glycol (MIRALAX ) 17 gram powd powder Take 17 g by mouth daily as needed for constipation. 238 g 3  . predniSONE (DELTASONE) 5 mg tablet Take 1 tablet (5 mg total) by mouth daily. 30 tablet 11   . sulfamethoxazole-trimethoprim (BACTRIM) 400-80 mg per tablet Take 1 tablet by mouth Every Monday, Wednesday, Friday. 12 tablet 11  . tacrolimus (PROGRAF) 0.5 mg capsule Take 1 capsule (0.5 mg total) by mouth 2 (two) times a day. 60 capsule 5  . timolol (TIMOPTIC) 0.5 % ophthalmic solution Administer 1 drop into both eyes 2 (two) times a day. 10 mL 11   Current Facility-Administered Medications on File Prior to Visit  Medication Dose Route Frequency Provider Last Rate Last Admin  . albuterol HFA (PROVENTIL HFA;VENTOLIN HFA;PROAIR HFA) 90 mcg/actuation inhaler 2 puff  2 puff inhalation Q4H PRN Lamar Fairy Plain, MD      . diphenhydrAMINE (BENADRYL) injection 50 mg  50 mg intravenous Once PRN Lamar Fairy Plain, MD      . EPINEPHrine  (EPIPEN ) 0.3 mg/0.3 mL injection syringe 0.3 mg  0.3 mg intramuscular PRN Lamar Fairy Plain, MD      . famotidine (Pepcid) injection 20 mg  20 mg intravenous Once PRN Lamar Fairy Plain, MD      . methylPREDNISolone sodium succinate (PF) (SOLU-Medrol) injection 40 mg  40 mg intravenous Once PRN Lamar Fairy Plain, MD      . sodium chloride  (bolus) 0.9 % bolus 500 mL  500 mL intravenous Once PRN Lamar Fairy Plain, MD

## 2024-01-24 NOTE — Progress Notes (Signed)
 TXP prograf 0.5/0.5 Patient reports last dose taken this morning at 0800  Intake approximately 64 ounces of fluid intake daily UOP: WNL  S/S of urinary tract infection: no symptoms reported Medication review completed: no changes in med list  Concerns today: none      Antonio Keller, CMA

## 2024-01-25 NOTE — Progress Notes (Signed)
 SABRA

## 2024-01-26 NOTE — Telephone Encounter (Signed)
 Called and spoke with [place] at [facility] to confirm receipt of refer back documents faxed in May 2025. States the patient had an appointment in June to establish care but the patient cancelled it. They have not been able to contact patient to reschedule appointment.   Called and spoke with patient's wife - instructed to call [facility] to schedule an appointment to re establish care so they can begin to manage BELA. Verbalized understanding   Electronically signed by: Rosina Public, RN 01/26/2024 9:07 AM

## 2024-02-09 NOTE — Progress Notes (Signed)
 Patient here for [facility] 24G PIV placed right AC .  Rollo Lawrance Lockwood, LPN

## 2024-02-10 NOTE — Telephone Encounter (Addendum)
 Patient has been referred back to [facility] - called to confirm he has an appointment with Dr. Dolan on 02/22/24. Will fax labs and note to monitor labs.   Electronically signed by: Rosina Public, RN 02/10/2024 9:14 AM   ----- Message from Marca Vinson Blas, MD sent at 02/09/2024  5:10 PM EDT ----- Labs reviewed. Creatinine at higher end of baseline range. No changes to Prograf dose. Patient on Belatacept. Please ask patient to hydrate well and repeat labs in 2 weeks with UPC, BK, CMV and EBV  PCR. Lab Results      Component                Value               Date                      CREATININE               2.00 (H)            02/09/2024            Thanks Marca Hadassah Vinson Blas, MD  ----- Message ----- From: Lab, Background User Sent: 02/09/2024  12:40 PM EDT To: Marca Hadassah Vinson Blas, MD

## 2024-07-06 ENCOUNTER — Other Ambulatory Visit (HOSPITAL_BASED_OUTPATIENT_CLINIC_OR_DEPARTMENT_OTHER): Payer: Self-pay

## 2024-07-06 ENCOUNTER — Other Ambulatory Visit (HOSPITAL_COMMUNITY): Payer: Self-pay

## 2024-07-06 ENCOUNTER — Other Ambulatory Visit: Payer: Self-pay

## 2024-07-06 MED ORDER — ENTECAVIR 0.5 MG PO TABS
0.5000 mg | ORAL_TABLET | Freq: Every day | ORAL | 6 refills | Status: AC
  Filled 2024-07-06: qty 30, 30d supply, fill #0

## 2024-07-06 NOTE — Progress Notes (Signed)
 Specialty Pharmacy Initial Fill Coordination Note  Antonio Keller is a 64 y.o. male contacted today regarding initial fill of specialty medication(s) Entecavir  (BARACLUDE )   Patient requested [facility] at [facility] at [place] date: 07/06/24   Medication will be filled on: 07/06/24    Patient is aware of $14.51 copayment.

## 2024-07-06 NOTE — Progress Notes (Signed)
 Specialty Pharmacy Initiation Note   Antonio Keller is a 64 y.o. male who will be followed by the specialty pharmacy service for RxSp Hepatitis B    Review of administration, indication, effectiveness, safety, potential side effects, storage/disposable, and missed dose instructions occurred today for patient's specialty medication(s) Entecavir  (BARACLUDE )     Patient/Caregiver did not have any additional questions or concerns.   Patient's therapy is appropriate to: Continue    Goals Addressed             This Visit's Progress    Achieve sustained HBV viral load suppression   On track    Patient is on track. Patient will maintain adherence. Patient was previously on therapy and transferring to [facility]         [facility] Specialty Pharmacist

## 2024-07-07 ENCOUNTER — Other Ambulatory Visit (HOSPITAL_BASED_OUTPATIENT_CLINIC_OR_DEPARTMENT_OTHER): Payer: Self-pay

## 2024-07-07 ENCOUNTER — Other Ambulatory Visit: Payer: Self-pay
